# Patient Record
Sex: Female | Born: 1935 | Race: White | Hispanic: No | Marital: Single | Smoking: Never smoker
Health system: Southern US, Community
[De-identification: ages and names within clinical notes are randomized; demographics above are authoritative.]

## PROBLEM LIST (undated history)

## (undated) DIAGNOSIS — G5600 Carpal tunnel syndrome, unspecified upper limb: Secondary | ICD-10-CM

## (undated) DIAGNOSIS — C801 Malignant (primary) neoplasm, unspecified: Secondary | ICD-10-CM

## (undated) DIAGNOSIS — Z973 Presence of spectacles and contact lenses: Secondary | ICD-10-CM

## (undated) DIAGNOSIS — M810 Age-related osteoporosis without current pathological fracture: Secondary | ICD-10-CM

## (undated) DIAGNOSIS — M1990 Unspecified osteoarthritis, unspecified site: Secondary | ICD-10-CM

## (undated) DIAGNOSIS — E039 Hypothyroidism, unspecified: Secondary | ICD-10-CM

## (undated) DIAGNOSIS — Z8489 Family history of other specified conditions: Secondary | ICD-10-CM

## (undated) DIAGNOSIS — T8859XA Other complications of anesthesia, initial encounter: Secondary | ICD-10-CM

## (undated) DIAGNOSIS — T4145XA Adverse effect of unspecified anesthetic, initial encounter: Secondary | ICD-10-CM

## (undated) HISTORY — PX: EYE SURGERY: SHX253

## (undated) HISTORY — DX: Malignant (primary) neoplasm, unspecified: C80.1

## (undated) HISTORY — PX: OOPHORECTOMY: SHX86

## (undated) HISTORY — PX: OTHER SURGICAL HISTORY: SHX169

## (undated) HISTORY — DX: Carpal tunnel syndrome, unspecified upper limb: G56.00

## (undated) HISTORY — PX: ABDOMINAL HYSTERECTOMY: SHX81

## (undated) HISTORY — DX: Presence of spectacles and contact lenses: Z97.3

---

## 1965-03-18 HISTORY — PX: THYROID SURGERY: SHX805

## 1993-03-18 HISTORY — PX: COLON RESECTION: SHX5231

## 2004-02-14 ENCOUNTER — Ambulatory Visit: Payer: Self-pay | Admitting: Unknown Physician Specialty

## 2004-02-29 ENCOUNTER — Ambulatory Visit: Payer: Self-pay | Admitting: Unknown Physician Specialty

## 2004-04-12 ENCOUNTER — Observation Stay: Payer: Self-pay | Admitting: Internal Medicine

## 2004-05-02 ENCOUNTER — Ambulatory Visit: Payer: Self-pay | Admitting: Unknown Physician Specialty

## 2004-06-27 ENCOUNTER — Ambulatory Visit: Payer: Self-pay | Admitting: Unknown Physician Specialty

## 2004-08-06 ENCOUNTER — Ambulatory Visit: Payer: Self-pay | Admitting: Unknown Physician Specialty

## 2004-12-25 ENCOUNTER — Ambulatory Visit: Payer: Self-pay | Admitting: Unknown Physician Specialty

## 2005-08-13 ENCOUNTER — Ambulatory Visit: Payer: Self-pay | Admitting: Cardiology

## 2005-09-12 ENCOUNTER — Ambulatory Visit: Payer: Self-pay | Admitting: Unknown Physician Specialty

## 2006-07-01 ENCOUNTER — Ambulatory Visit: Payer: Self-pay | Admitting: Unknown Physician Specialty

## 2006-09-23 ENCOUNTER — Ambulatory Visit: Payer: Self-pay | Admitting: Unknown Physician Specialty

## 2007-05-01 ENCOUNTER — Ambulatory Visit: Payer: Self-pay

## 2007-09-30 ENCOUNTER — Ambulatory Visit: Payer: Self-pay | Admitting: Unknown Physician Specialty

## 2008-11-03 ENCOUNTER — Ambulatory Visit: Payer: Self-pay | Admitting: Unknown Physician Specialty

## 2009-11-30 ENCOUNTER — Ambulatory Visit: Payer: Self-pay | Admitting: Unknown Physician Specialty

## 2009-12-13 ENCOUNTER — Ambulatory Visit: Payer: Self-pay | Admitting: Unknown Physician Specialty

## 2010-04-23 ENCOUNTER — Ambulatory Visit (HOSPITAL_COMMUNITY)
Admission: RE | Admit: 2010-04-23 | Discharge: 2010-04-23 | Disposition: A | Discharge status: Home / Self Care | Payer: Medicare Other | Source: Ambulatory Visit | Attending: Surgery | Admitting: Surgery

## 2010-04-23 ENCOUNTER — Encounter (HOSPITAL_COMMUNITY): Payer: Medicare Other

## 2010-04-23 DIAGNOSIS — Z01818 Encounter for other preprocedural examination: Secondary | ICD-10-CM | POA: Insufficient documentation

## 2010-04-23 DIAGNOSIS — E049 Nontoxic goiter, unspecified: Secondary | ICD-10-CM | POA: Insufficient documentation

## 2010-04-23 LAB — CBC
MCH: 31.9 pg (ref 26.0–34.0)
MCHC: 33.1 g/dL (ref 30.0–36.0)
Platelets: 204 10*3/uL (ref 150–400)

## 2010-04-23 LAB — URINALYSIS, ROUTINE W REFLEX MICROSCOPIC
Bilirubin Urine: NEGATIVE
Ketones, ur: NEGATIVE mg/dL
Nitrite: NEGATIVE
Urobilinogen, UA: 0.2 mg/dL (ref 0.0–1.0)

## 2010-04-23 LAB — PROTIME-INR
INR: 1.04 (ref 0.00–1.49)
Prothrombin Time: 13.8 seconds (ref 11.6–15.2)

## 2010-04-23 LAB — DIFFERENTIAL
Basophils Relative: 1 % (ref 0–1)
Eosinophils Absolute: 0 10*3/uL (ref 0.0–0.7)
Monocytes Absolute: 0.6 10*3/uL (ref 0.1–1.0)
Monocytes Relative: 9 % (ref 3–12)
Neutrophils Relative %: 72 % (ref 43–77)

## 2010-04-23 LAB — BASIC METABOLIC PANEL
Calcium: 9.9 mg/dL (ref 8.4–10.5)
Creatinine, Ser: 0.83 mg/dL (ref 0.4–1.2)
GFR calc Af Amer: >60 mL/min (ref >60)

## 2010-04-23 LAB — URINE MICROSCOPIC-ADD ON

## 2010-05-01 ENCOUNTER — Ambulatory Visit (HOSPITAL_COMMUNITY)
Admission: RE | Admit: 2010-05-01 | Discharge: 2010-05-02 | Disposition: A | Discharge status: Home / Self Care | Payer: Medicare Other | Attending: Surgery | Admitting: Surgery

## 2010-05-01 ENCOUNTER — Other Ambulatory Visit: Payer: Self-pay | Admitting: Surgery

## 2010-05-01 DIAGNOSIS — I498 Other specified cardiac arrhythmias: Secondary | ICD-10-CM | POA: Insufficient documentation

## 2010-05-01 DIAGNOSIS — R9431 Abnormal electrocardiogram [ECG] [EKG]: Secondary | ICD-10-CM | POA: Insufficient documentation

## 2010-05-01 DIAGNOSIS — Z01812 Encounter for preprocedural laboratory examination: Secondary | ICD-10-CM | POA: Insufficient documentation

## 2010-05-01 DIAGNOSIS — Z79899 Other long term (current) drug therapy: Secondary | ICD-10-CM | POA: Insufficient documentation

## 2010-05-01 DIAGNOSIS — R110 Nausea: Secondary | ICD-10-CM | POA: Insufficient documentation

## 2010-05-01 DIAGNOSIS — E042 Nontoxic multinodular goiter: Secondary | ICD-10-CM | POA: Insufficient documentation

## 2010-05-01 LAB — CALCIUM: Calcium: 8.4 mg/dL (ref 8.4–10.5)

## 2010-05-02 LAB — CALCIUM: Calcium: 8.3 mg/dL — ABNORMAL LOW (ref 8.4–10.5)

## 2010-05-10 NOTE — Op Note (Signed)
Christine [name], Christine [name]                ACCOUNT NO.:  [id number]  MEDICAL RECORD NO.:  [id number]           PATIENT TYPE:  O  LOCATION:  XRAY                         FACILITY:  [facility]  PHYSICIAN:  Earnstine Regal, MD      DATE OF BIRTH:  [birth year]  DATE OF PROCEDURE:  05/01/2010 DATE OF DISCHARGE:  04/23/2010                              OPERATIVE REPORT   PREOPERATIVE DIAGNOSIS:  Bilateral thyroid nodules with cytologic atypia.  POSTOPERATIVE DIAGNOSIS:  Bilateral thyroid nodules with cytologic atypia.  PROCEDURE: 1. Completion thyroidectomy. 2. Autotransplantation right superior parathyroid gland to right     sternocleidomastoid muscle.  SURGEON:  Earnstine Regal, M.D.  ASSISTANT:  Orson Ape. Rise Patience, M.D.  ANESTHESIA:  General per Dr. Freddie Apley.  ESTIMATED BLOOD LOSS:  Minimal.  PREPARATION:  ChloraPrep.  COMPLICATIONS:  None.  INDICATIONS:  The patient is an 77 year old white female from [place], [place].  She is referred by Dr. Eddie Dibbles and Dr. Langston Masker the [facility] for evaluation of thyroid nodules with cytologic atypia.  The patient had her initial thyroid surgery in 1967 with partial resection of the left thyroid lobe for reportedly benign disease.  Patient took thyroid hormone replacement for 30 years.  She has now been off of thyroid hormone for several years.  A recent radiographic study demonstrated an incidental finding of a right thyroid nodule.  Fine-needle aspiration biopsy was obtained and showed cytologic atypia suspicious for papillary thyroid carcinoma.  Patient is now referred for consideration for completion thyroidectomy for management of bilateral thyroid nodules and possible malignancy in the right thyroid lobe.  DESCRIPTION OF PROCEDURE:  Procedure was done in OR #2 at the [facility].  Patient was brought to the operating room, placed in the supine position on the operating room table.   Following administration of general anesthesia, the patient was positioned and then prepped and draped in the usual strict aseptic fashion.  After ascertaining that an adequate level of anesthesia have been achieved, the previous Kocher incision was reopened with a #15 blade.  Dissection was carried through subcutaneous tissues and scar tissue.  Hemostasis was obtained with the electrocautery.  Subplatysmal flaps were developed cephalad and caudad from the thyroid notch to the sternal notch.  The Mahorner self-retaining retractor was placed for exposure.  Strap muscles were incised in the midline.  External jugular veins were suture ligated.  Dissection was carried down to the trachea.  With some difficulty, the musculature over the left thyroid bed was mobilized. There was dense scar tissue.  There is a residual left thyroid lobe, which is relatively small, but does contain palpable nodules.  Again, strap muscles were reflected laterally exposing the entire residual lobe.  Superior pole vessels were still present and were gently dissected out and divided between Ligaclips with the harmonic scalpel. Parathyroid tissue was identified and preserved.  Inferior venous tributaries were divided between Ligaclips with the harmonic scalpel. The residual lobe is rolled anteriorly.  Scar tissue was dissected away from the capsule.  Recurrent nerve was identified and preserved. Branches of the inferior thyroid artery are divided between  small Ligaclips with the harmonic scalpel.  Ligament of Gwenlyn Found was released and the gland was mobilized up and onto the anterior trachea.  There is a moderate-sized pyramidal lobe, which was also dissected off of the thyroid cartilage and resected on block with the isthmus and residual left thyroid lobe.  Left lobe was submitted to pathology for review.  Next, we turned our attention to the right thyroid lobe.  Again, strap muscles are reflected laterally with some  difficulty as they are densely adherent to the anterior surface of the lobe.  Right lobe was moderately enlarged.  There are multiple nodules, but there is a dominant mass in the inferior pole, which is worrisome for neoplasm.  Strap muscles are fully mobilize off of the capsule of the right lobe.  Superior pole vessels are dissected out and divided between medium Ligaclips with the harmonic scalpel.  The inferior venous tributaries were also dissected out and divided between medium Ligaclips with the harmonic scalpel. There is scarring laterally and posteriorly consistent with previous exploration.  This was gently mobilized on the capsule.  Branches of the inferior thyroid artery are divided between small and medium Ligaclips. Gland is rolled anteriorly.  Ligament of Gwenlyn Found is released with the electrocautery and the gland is mobilized onto the anterior trachea from which it is completely excised.  It is submitted separately labeled as right thyroid lobe to pathology.  Prior to submitting the gland, a close inspection reveals what appears to be parathyroid tissue near the superior pole.  This was dissected off of the capsule.  It was reimplanted into the right sternocleidomastoid muscle by opening a muscular pocket with a right angle clamp and inserting the fragmented parathyroid gland.  Muscle was closed with a 3-0 Vicryl figure-of-eight suture.  Neck is irrigated copiously bilaterally.  Good hemostasis was achieved bilaterally with Ligaclips.  Surgicel was placed in the operative field bilaterally.  Strap muscles were reapproximated in the midline with interrupted 3-0 Vicryl sutures.  Platysma was closed with interrupted 3- 0 Vicryl sutures.  Skin was closed with running 4-0 Monocryl subcuticular suture.  Wound was washed and dried and Benzoin and Steri- Strips are applied.  Sterile dressings were applied.  Patient was awakened from anesthesia and brought to the recovery room.  The  patient tolerated the procedure well.     Earnstine Regal, MD     TMG/MEDQ  D:  05/01/2010  T:  05/01/2010  Job:  [id number]  cc:   Adella Hare, M.D.  Kem Kays, M.D.  Earnstine Regal, MD [address] [address]  Electronically Signed by Armandina Gemma MD on 05/10/2010 10:32:59 AM

## 2010-05-10 NOTE — Discharge Summary (Signed)
  Christine [name], [name]                ACCOUNT NO.:  [id number]  MEDICAL RECORD NO.:  [id number]           PATIENT TYPE:  I  LOCATION:  [id number]                         FACILITY:  [facility]  PHYSICIAN:  Christine Regal, MD      DATE OF BIRTH:  11-11-1920  DATE OF ADMISSION:  05/01/2010 DATE OF DISCHARGE:  05/02/2010                              DISCHARGE SUMMARY   REASON FOR ADMISSION:  Thyroid nodules.  HISTORY OF PRESENT ILLNESS:  The patient is an 77 year old white female from [place], [place].  The patient has bilateral thyroid nodules with cytologic atypia and a right thyroid mass.  The patient had previous partial thyroid resection in 1967.  She now comes to the operating room for completion thyroidectomy.  HOSPITAL COURSE:  The patient was admitted on May 01, 2010.  She was taken directly to the operating room where she underwent completion thyroidectomy.  Postoperatively, the patient's serum calcium levels remained stable at 8.4 and 8.3.  She had mild pain.  She had mild nausea.  This was treated with intravenous Zofran and Phenergan.  The patient tolerated a diet.  She was prepared for discharge home on the first postoperative day.  DISCHARGE PLANNING:  The patient is discharged home on Wednesday May 02, 2010, in good condition.  DISCHARGE MEDICATIONS:  Include Synthroid 88 mcg daily, Vicodin as needed for pain, and Tums 2 tablets 3 times daily.  The patient will be seen back in my office at [facility] in 2 weeks.  A serum calcium level will be checked prior to that office visit.  FINAL DIAGNOSES:  Bilateral thyroid nodules with cytologic atypia, final pathologic results pending at the time of discharge.  CONDITION AT DISCHARGE:  Good.     Christine Regal, MD     TMG/MEDQ  D:  05/02/2010  T:  05/02/2010  Job:  [id number]  cc:   Christine [name], D.D.S. Fax: [phone]  Christine [name], M.D.  Electronically Signed by Christine Gemma MD on  05/10/2010 10:32:51 AM

## 2010-07-18 ENCOUNTER — Encounter (INDEPENDENT_AMBULATORY_CARE_PROVIDER_SITE_OTHER): Payer: Self-pay | Admitting: Surgery

## 2010-12-25 ENCOUNTER — Ambulatory Visit: Payer: Self-pay | Admitting: Unknown Physician Specialty

## 2011-12-26 ENCOUNTER — Ambulatory Visit: Payer: Self-pay | Admitting: Unknown Physician Specialty

## 2013-02-04 ENCOUNTER — Ambulatory Visit: Payer: Self-pay | Admitting: Internal Medicine

## 2013-12-28 DIAGNOSIS — N3946 Mixed incontinence: Secondary | ICD-10-CM | POA: Diagnosis present

## 2014-02-07 ENCOUNTER — Ambulatory Visit: Payer: Self-pay | Admitting: Internal Medicine

## 2014-09-01 DIAGNOSIS — E890 Postprocedural hypothyroidism: Secondary | ICD-10-CM | POA: Diagnosis not present

## 2014-09-08 DIAGNOSIS — R21 Rash and other nonspecific skin eruption: Secondary | ICD-10-CM | POA: Diagnosis not present

## 2014-09-08 DIAGNOSIS — E890 Postprocedural hypothyroidism: Secondary | ICD-10-CM | POA: Diagnosis not present

## 2014-09-08 DIAGNOSIS — G629 Polyneuropathy, unspecified: Secondary | ICD-10-CM | POA: Diagnosis not present

## 2014-11-03 DIAGNOSIS — H524 Presbyopia: Secondary | ICD-10-CM | POA: Diagnosis not present

## 2014-11-03 DIAGNOSIS — H5210 Myopia, unspecified eye: Secondary | ICD-10-CM | POA: Diagnosis not present

## 2014-12-09 DIAGNOSIS — E890 Postprocedural hypothyroidism: Secondary | ICD-10-CM | POA: Diagnosis not present

## 2014-12-16 DIAGNOSIS — E890 Postprocedural hypothyroidism: Secondary | ICD-10-CM | POA: Diagnosis not present

## 2014-12-16 DIAGNOSIS — N3946 Mixed incontinence: Secondary | ICD-10-CM | POA: Diagnosis not present

## 2014-12-16 DIAGNOSIS — G629 Polyneuropathy, unspecified: Secondary | ICD-10-CM | POA: Diagnosis not present

## 2015-01-09 ENCOUNTER — Inpatient Hospital Stay
Admission: EM | Admit: 2015-01-09 | Discharge: 2015-01-13 | DRG: 481 | Disposition: A | Discharge status: Skilled Nursing Facility | Payer: Commercial Managed Care - HMO | Attending: Internal Medicine | Admitting: Internal Medicine

## 2015-01-09 ENCOUNTER — Inpatient Hospital Stay: Payer: Commercial Managed Care - HMO | Admitting: Anesthesiology

## 2015-01-09 ENCOUNTER — Emergency Department: Payer: Commercial Managed Care - HMO

## 2015-01-09 ENCOUNTER — Inpatient Hospital Stay: Payer: Commercial Managed Care - HMO

## 2015-01-09 ENCOUNTER — Encounter
Admission: EM | Disposition: A | Discharge status: Skilled Nursing Facility | Payer: Self-pay | Source: Home / Self Care | Attending: Internal Medicine

## 2015-01-09 DIAGNOSIS — R42 Dizziness and giddiness: Secondary | ICD-10-CM | POA: Diagnosis not present

## 2015-01-09 DIAGNOSIS — Z85038 Personal history of other malignant neoplasm of large intestine: Secondary | ICD-10-CM

## 2015-01-09 DIAGNOSIS — S72001A Fracture of unspecified part of neck of right femur, initial encounter for closed fracture: Secondary | ICD-10-CM

## 2015-01-09 DIAGNOSIS — Z79899 Other long term (current) drug therapy: Secondary | ICD-10-CM

## 2015-01-09 DIAGNOSIS — Z419 Encounter for procedure for purposes other than remedying health state, unspecified: Secondary | ICD-10-CM

## 2015-01-09 DIAGNOSIS — D649 Anemia, unspecified: Secondary | ICD-10-CM | POA: Diagnosis not present

## 2015-01-09 DIAGNOSIS — D72829 Elevated white blood cell count, unspecified: Secondary | ICD-10-CM | POA: Diagnosis present

## 2015-01-09 DIAGNOSIS — H3530 Unspecified macular degeneration: Secondary | ICD-10-CM | POA: Diagnosis present

## 2015-01-09 DIAGNOSIS — Z9181 History of falling: Secondary | ICD-10-CM | POA: Diagnosis not present

## 2015-01-09 DIAGNOSIS — M1990 Unspecified osteoarthritis, unspecified site: Secondary | ICD-10-CM | POA: Diagnosis present

## 2015-01-09 DIAGNOSIS — Z7982 Long term (current) use of aspirin: Secondary | ICD-10-CM

## 2015-01-09 DIAGNOSIS — Y838 Other surgical procedures as the cause of abnormal reaction of the patient, or of later complication, without mention of misadventure at the time of the procedure: Secondary | ICD-10-CM | POA: Diagnosis not present

## 2015-01-09 DIAGNOSIS — L7622 Postprocedural hemorrhage and hematoma of skin and subcutaneous tissue following other procedure: Secondary | ICD-10-CM | POA: Diagnosis not present

## 2015-01-09 DIAGNOSIS — Z8673 Personal history of transient ischemic attack (TIA), and cerebral infarction without residual deficits: Secondary | ICD-10-CM | POA: Diagnosis not present

## 2015-01-09 DIAGNOSIS — E039 Hypothyroidism, unspecified: Secondary | ICD-10-CM | POA: Diagnosis present

## 2015-01-09 DIAGNOSIS — W1830XA Fall on same level, unspecified, initial encounter: Secondary | ICD-10-CM | POA: Diagnosis present

## 2015-01-09 DIAGNOSIS — R69 Illness, unspecified: Secondary | ICD-10-CM | POA: Diagnosis not present

## 2015-01-09 DIAGNOSIS — S72141A Displaced intertrochanteric fracture of right femur, initial encounter for closed fracture: Secondary | ICD-10-CM | POA: Diagnosis not present

## 2015-01-09 DIAGNOSIS — R531 Weakness: Secondary | ICD-10-CM | POA: Diagnosis not present

## 2015-01-09 DIAGNOSIS — Z01818 Encounter for other preprocedural examination: Secondary | ICD-10-CM | POA: Diagnosis not present

## 2015-01-09 DIAGNOSIS — M25562 Pain in left knee: Secondary | ICD-10-CM | POA: Diagnosis not present

## 2015-01-09 DIAGNOSIS — Y92000 Kitchen of unspecified non-institutional (private) residence as  the place of occurrence of the external cause: Secondary | ICD-10-CM | POA: Diagnosis not present

## 2015-01-09 DIAGNOSIS — F439 Reaction to severe stress, unspecified: Secondary | ICD-10-CM | POA: Diagnosis present

## 2015-01-09 DIAGNOSIS — M810 Age-related osteoporosis without current pathological fracture: Secondary | ICD-10-CM | POA: Diagnosis present

## 2015-01-09 DIAGNOSIS — M6281 Muscle weakness (generalized): Secondary | ICD-10-CM | POA: Diagnosis not present

## 2015-01-09 DIAGNOSIS — M80051D Age-related osteoporosis with current pathological fracture, right femur, subsequent encounter for fracture with routine healing: Secondary | ICD-10-CM | POA: Diagnosis not present

## 2015-01-09 DIAGNOSIS — M25561 Pain in right knee: Secondary | ICD-10-CM | POA: Diagnosis not present

## 2015-01-09 DIAGNOSIS — R2689 Other abnormalities of gait and mobility: Secondary | ICD-10-CM | POA: Diagnosis not present

## 2015-01-09 DIAGNOSIS — R339 Retention of urine, unspecified: Secondary | ICD-10-CM | POA: Diagnosis not present

## 2015-01-09 DIAGNOSIS — W19XXXA Unspecified fall, initial encounter: Secondary | ICD-10-CM

## 2015-01-09 DIAGNOSIS — H9190 Unspecified hearing loss, unspecified ear: Secondary | ICD-10-CM | POA: Diagnosis not present

## 2015-01-09 DIAGNOSIS — M25559 Pain in unspecified hip: Secondary | ICD-10-CM | POA: Diagnosis not present

## 2015-01-09 DIAGNOSIS — S72009A Fracture of unspecified part of neck of unspecified femur, initial encounter for closed fracture: Secondary | ICD-10-CM | POA: Diagnosis present

## 2015-01-09 DIAGNOSIS — R079 Chest pain, unspecified: Secondary | ICD-10-CM | POA: Diagnosis not present

## 2015-01-09 HISTORY — DX: Unspecified osteoarthritis, unspecified site: M19.90

## 2015-01-09 HISTORY — PX: INTRAMEDULLARY (IM) NAIL INTERTROCHANTERIC: SHX5875

## 2015-01-09 HISTORY — DX: Hypothyroidism, unspecified: E03.9

## 2015-01-09 HISTORY — DX: Age-related osteoporosis without current pathological fracture: M81.0

## 2015-01-09 LAB — PROTIME-INR
INR: 0.97
Prothrombin Time: 13.1 seconds (ref 11.4–15.0)

## 2015-01-09 LAB — CBC WITH DIFFERENTIAL/PLATELET
BASOS ABS: 0.1 10*3/uL (ref 0–0.1)
BASOS PCT: 1 %
EOS ABS: 0.1 10*3/uL (ref 0–0.7)
EOS PCT: 0 %
HCT: 38 % (ref 35.0–47.0)
Hemoglobin: 12.4 g/dL (ref 12.0–16.0)
Lymphocytes Relative: 14 %
Lymphs Abs: 1.7 10*3/uL (ref 1.0–3.6)
MCH: 31.6 pg (ref 26.0–34.0)
MCHC: 32.6 g/dL (ref 32.0–36.0)
MCV: 96.9 fL (ref 80.0–100.0)
MONO ABS: 0.5 10*3/uL (ref 0.2–0.9)
Monocytes Relative: 4 %
NEUTROS PCT: 81 %
Neutro Abs: 9.8 10*3/uL — ABNORMAL HIGH (ref 1.4–6.5)
PLATELETS: 219 10*3/uL (ref 150–440)
RBC: 3.92 MIL/uL (ref 3.80–5.20)
RDW: 13.1 % (ref 11.5–14.5)
WBC: 12.2 10*3/uL — ABNORMAL HIGH (ref 3.6–11.0)

## 2015-01-09 LAB — MRSA PCR SCREENING: MRSA BY PCR: NEGATIVE

## 2015-01-09 LAB — COMPREHENSIVE METABOLIC PANEL
ALK PHOS: 74 U/L (ref 38–126)
ALT: 15 U/L (ref 14–54)
AST: 31 U/L (ref 15–41)
Albumin: 3.9 g/dL (ref 3.5–5.0)
Anion gap: 10 (ref 5–15)
BUN: 17 mg/dL (ref 6–20)
CALCIUM: 8.9 mg/dL (ref 8.9–10.3)
CO2: 27 mmol/L (ref 22–32)
CREATININE: 0.94 mg/dL (ref 0.44–1.00)
Chloride: 102 mmol/L (ref 101–111)
GFR calc non Af Amer: 51 mL/min — ABNORMAL LOW (ref >60)
GFR, EST AFRICAN AMERICAN: 59 mL/min — AB (ref >60)
GLUCOSE: 166 mg/dL — AB (ref 65–99)
Potassium: 3.9 mmol/L (ref 3.5–5.1)
SODIUM: 139 mmol/L (ref 135–145)
Total Bilirubin: 0.8 mg/dL (ref 0.3–1.2)
Total Protein: 7.1 g/dL (ref 6.5–8.1)

## 2015-01-09 LAB — TROPONIN I

## 2015-01-09 LAB — ABO/RH: ABO/RH(D): A POS

## 2015-01-09 SURGERY — FIXATION, FRACTURE, INTERTROCHANTERIC, WITH INTRAMEDULLARY ROD
Anesthesia: Spinal | Site: Hip | Laterality: Right | Wound class: Clean

## 2015-01-09 MED ORDER — LACTATED RINGERS IV SOLN
INTRAVENOUS | Status: DC | PRN
  Administered 2015-01-09: 16:00:00 via INTRAVENOUS

## 2015-01-09 MED ORDER — BISACODYL 10 MG RE SUPP: 10.0000 mg | Freq: Every day | RECTAL | Status: DC | PRN

## 2015-01-09 MED ORDER — CEFAZOLIN SODIUM-DEXTROSE 2-3 GM-% IV SOLR
2.0000 g | INTRAVENOUS | Status: AC
  Administered 2015-01-09: 2 g via INTRAVENOUS
  Filled 2015-01-09 (×2): qty 50

## 2015-01-09 MED ORDER — OXYCODONE HCL 5 MG PO TABS
5.0000 mg | ORAL_TABLET | ORAL | Status: DC | PRN
  Administered 2015-01-09: 5 mg via ORAL
  Administered 2015-01-10 (×2): 10 mg via ORAL
  Administered 2015-01-10 – 2015-01-13 (×6): 5 mg via ORAL
  Filled 2015-01-09: qty 2
  Filled 2015-01-09 (×4): qty 1
  Filled 2015-01-09: qty 2
  Filled 2015-01-09 (×3): qty 1

## 2015-01-09 MED ORDER — VITAMIN D 1000 UNITS PO TABS
1000.0000 [IU] | ORAL_TABLET | Freq: Every evening | ORAL | Status: DC
Start: 2015-01-09 — End: 2015-01-13
  Administered 2015-01-10 – 2015-01-12 (×2): 1000 [IU] via ORAL
  Filled 2015-01-09 (×2): qty 1

## 2015-01-09 MED ORDER — NEOMYCIN-POLYMYXIN B GU 40-200000 IR SOLN
Status: AC
  Filled 2015-01-09: qty 2

## 2015-01-09 MED ORDER — SIMETHICONE 80 MG PO CHEW
80.0000 mg | CHEWABLE_TABLET | Freq: Four times a day (QID) | ORAL | Status: DC | PRN
  Administered 2015-01-10 – 2015-01-12 (×3): 80 mg via ORAL
  Filled 2015-01-09 (×3): qty 1

## 2015-01-09 MED ORDER — PANTOPRAZOLE SODIUM 40 MG PO TBEC
40.0000 mg | DELAYED_RELEASE_TABLET | Freq: Two times a day (BID) | ORAL | Status: DC
  Administered 2015-01-09 – 2015-01-13 (×8): 40 mg via ORAL
  Filled 2015-01-09 (×8): qty 1

## 2015-01-09 MED ORDER — CEFAZOLIN SODIUM-DEXTROSE 2-3 GM-% IV SOLR
2.0000 g | Freq: Four times a day (QID) | INTRAVENOUS | Status: AC
  Administered 2015-01-09 – 2015-01-10 (×3): 2 g via INTRAVENOUS
  Filled 2015-01-09 (×3): qty 50

## 2015-01-09 MED ORDER — PHENYLEPHRINE HCL 10 MG/ML IJ SOLN
INTRAMUSCULAR | Status: DC | PRN
  Administered 2015-01-09: 50 ug via INTRAVENOUS
  Administered 2015-01-09 (×2): 100 ug via INTRAVENOUS
  Administered 2015-01-09: 50 ug via INTRAVENOUS

## 2015-01-09 MED ORDER — DIPHENHYDRAMINE HCL 12.5 MG/5ML PO ELIX: 12.5000 mg | ORAL_SOLUTION | ORAL | Status: DC | PRN

## 2015-01-09 MED ORDER — ACETAMINOPHEN 325 MG PO TABS
650.0000 mg | ORAL_TABLET | Freq: Four times a day (QID) | ORAL | Status: DC | PRN
  Administered 2015-01-12: 650 mg via ORAL
  Filled 2015-01-09 (×2): qty 2

## 2015-01-09 MED ORDER — SODIUM CHLORIDE 0.9 % IJ SOLN
3.0000 mL | Freq: Two times a day (BID) | INTRAMUSCULAR | Status: DC
  Administered 2015-01-09 – 2015-01-13 (×5): 3 mL via INTRAVENOUS

## 2015-01-09 MED ORDER — FENTANYL CITRATE (PF) 100 MCG/2ML IJ SOLN
INTRAMUSCULAR | Status: AC
  Filled 2015-01-09: qty 2

## 2015-01-09 MED ORDER — MAGNESIUM HYDROXIDE 400 MG/5ML PO SUSP
30.0000 mL | Freq: Every day | ORAL | Status: DC | PRN
  Administered 2015-01-10 – 2015-01-11 (×2): 30 mL via ORAL
  Filled 2015-01-09 (×2): qty 30

## 2015-01-09 MED ORDER — FLEET ENEMA 7-19 GM/118ML RE ENEM: 1.0000 | ENEMA | Freq: Once | RECTAL | Status: DC | PRN

## 2015-01-09 MED ORDER — MIDAZOLAM HCL 5 MG/5ML IJ SOLN
INTRAMUSCULAR | Status: DC | PRN
  Administered 2015-01-09: 0.5 mg via INTRAVENOUS
  Administered 2015-01-09: 1 mg via INTRAVENOUS

## 2015-01-09 MED ORDER — BUPIVACAINE HCL (PF) 0.5 % IJ SOLN
INTRAMUSCULAR | Status: DC | PRN
  Administered 2015-01-09: 3 mL

## 2015-01-09 MED ORDER — FENTANYL CITRATE (PF) 100 MCG/2ML IJ SOLN
INTRAMUSCULAR | Status: DC | PRN
  Administered 2015-01-09: 50 ug via INTRAVENOUS

## 2015-01-09 MED ORDER — MORPHINE SULFATE (PF) 2 MG/ML IV SOLN: 2.0000 mg | INTRAVENOUS | Status: DC | PRN

## 2015-01-09 MED ORDER — METOCLOPRAMIDE HCL 5 MG/ML IJ SOLN
5.0000 mg | Freq: Three times a day (TID) | INTRAMUSCULAR | Status: DC | PRN
  Administered 2015-01-11: 10 mg via INTRAVENOUS
  Filled 2015-01-09: qty 2

## 2015-01-09 MED ORDER — HYDROMORPHONE HCL 1 MG/ML IJ SOLN
0.5000 mg | INTRAMUSCULAR | Status: DC | PRN
  Administered 2015-01-09: 0.5 mg via INTRAVENOUS
  Filled 2015-01-09: qty 1

## 2015-01-09 MED ORDER — PROPOFOL 500 MG/50ML IV EMUL
INTRAVENOUS | Status: DC | PRN
  Administered 2015-01-09: 15 ug/kg/min via INTRAVENOUS

## 2015-01-09 MED ORDER — ACETAMINOPHEN 650 MG RE SUPP
650.0000 mg | Freq: Four times a day (QID) | RECTAL | Status: DC | PRN
  Administered 2015-01-11: 650 mg via RECTAL

## 2015-01-09 MED ORDER — OXYCODONE HCL 5 MG PO TABS: 5.0000 mg | ORAL_TABLET | ORAL | Status: DC | PRN

## 2015-01-09 MED ORDER — LEVOTHYROXINE SODIUM 100 MCG PO TABS
100.0000 ug | ORAL_TABLET | ORAL | Status: DC
  Administered 2015-01-10 – 2015-01-12 (×3): 100 ug via ORAL
  Filled 2015-01-09 (×6): qty 1

## 2015-01-09 MED ORDER — ONDANSETRON HCL 4 MG PO TABS: 4.0000 mg | ORAL_TABLET | Freq: Four times a day (QID) | ORAL | Status: DC | PRN

## 2015-01-09 MED ORDER — KCL IN DEXTROSE-NACL 20-5-0.9 MEQ/L-%-% IV SOLN
INTRAVENOUS | Status: DC
  Administered 2015-01-09 – 2015-01-11 (×4): via INTRAVENOUS
  Filled 2015-01-09 (×8): qty 1000

## 2015-01-09 MED ORDER — ENOXAPARIN SODIUM 40 MG/0.4ML ~~LOC~~ SOLN
40.0000 mg | SUBCUTANEOUS | Status: DC
Start: 2015-01-10 — End: 2015-01-13
  Administered 2015-01-10 – 2015-01-13 (×4): 40 mg via SUBCUTANEOUS
  Filled 2015-01-09 (×4): qty 0.4

## 2015-01-09 MED ORDER — METOCLOPRAMIDE HCL 5 MG PO TABS: 5.0000 mg | ORAL_TABLET | Freq: Three times a day (TID) | ORAL | Status: DC | PRN

## 2015-01-09 MED ORDER — ONDANSETRON HCL 4 MG/2ML IJ SOLN
INTRAMUSCULAR | Status: AC
  Administered 2015-01-09: 4 mg via INTRAVENOUS
  Filled 2015-01-09: qty 2

## 2015-01-09 MED ORDER — CALCIUM CARBONATE-VITAMIN D 500-200 MG-UNIT PO TABS
1.0000 | ORAL_TABLET | Freq: Two times a day (BID) | ORAL | Status: DC
  Administered 2015-01-09 – 2015-01-13 (×8): 1 via ORAL
  Filled 2015-01-09 (×8): qty 1

## 2015-01-09 MED ORDER — ALPRAZOLAM 0.25 MG PO TABS
0.2500 mg | ORAL_TABLET | Freq: Two times a day (BID) | ORAL | Status: DC | PRN
  Filled 2015-01-09: qty 1

## 2015-01-09 MED ORDER — LIDOCAINE HCL (CARDIAC) 20 MG/ML IV SOLN
INTRAVENOUS | Status: DC | PRN
  Administered 2015-01-09: 40 mg via INTRAVENOUS

## 2015-01-09 MED ORDER — ONDANSETRON HCL 4 MG/2ML IJ SOLN
4.0000 mg | Freq: Four times a day (QID) | INTRAMUSCULAR | Status: DC | PRN
  Administered 2015-01-10 – 2015-01-11 (×2): 4 mg via INTRAVENOUS
  Filled 2015-01-09: qty 2

## 2015-01-09 MED ORDER — ONDANSETRON HCL 4 MG/2ML IJ SOLN
4.0000 mg | Freq: Four times a day (QID) | INTRAMUSCULAR | Status: DC | PRN
  Administered 2015-01-09: 4 mg via INTRAVENOUS

## 2015-01-09 MED ORDER — BISACODYL 10 MG RE SUPP
10.0000 mg | Freq: Every day | RECTAL | Status: DC | PRN
  Filled 2015-01-09: qty 1

## 2015-01-09 MED ORDER — PROPOFOL 10 MG/ML IV BOLUS
INTRAVENOUS | Status: DC | PRN
  Administered 2015-01-09 (×2): 20 mg via INTRAVENOUS

## 2015-01-09 MED ORDER — FENTANYL CITRATE (PF) 100 MCG/2ML IJ SOLN
25.0000 ug | INTRAMUSCULAR | Status: DC | PRN
  Administered 2015-01-09: 25 ug via INTRAVENOUS

## 2015-01-09 MED ORDER — HYDROMORPHONE HCL 1 MG/ML IJ SOLN
0.5000 mg | Freq: Once | INTRAMUSCULAR | Status: AC
  Administered 2015-01-09: 0.5 mg via INTRAVENOUS
  Filled 2015-01-09: qty 1

## 2015-01-09 MED ORDER — DOCUSATE SODIUM 100 MG PO CAPS
100.0000 mg | ORAL_CAPSULE | Freq: Two times a day (BID) | ORAL | Status: DC
  Administered 2015-01-09 – 2015-01-13 (×8): 100 mg via ORAL
  Filled 2015-01-09 (×8): qty 1

## 2015-01-09 MED ORDER — SODIUM CHLORIDE 0.9 % IV SOLN
INTRAVENOUS | Status: DC
  Administered 2015-01-09: 13:00:00 via INTRAVENOUS

## 2015-01-09 MED ORDER — ACETAMINOPHEN 325 MG PO TABS: 650.0000 mg | ORAL_TABLET | Freq: Four times a day (QID) | ORAL | Status: DC | PRN

## 2015-01-09 MED ORDER — EPHEDRINE SULFATE 50 MG/ML IJ SOLN
INTRAMUSCULAR | Status: DC | PRN
  Administered 2015-01-09: 10 mg via INTRAVENOUS

## 2015-01-09 MED ORDER — POLYETHYLENE GLYCOL 3350 17 G PO PACK: 17.0000 g | PACK | Freq: Every day | ORAL | Status: DC | PRN

## 2015-01-09 MED ORDER — ONDANSETRON HCL 4 MG/2ML IJ SOLN
4.0000 mg | Freq: Once | INTRAMUSCULAR | Status: DC | PRN
  Filled 2015-01-09 (×2): qty 2

## 2015-01-09 MED ORDER — ACETAMINOPHEN 650 MG RE SUPP: 650.0000 mg | Freq: Four times a day (QID) | RECTAL | Status: DC | PRN

## 2015-01-09 SURGICAL SUPPLY — 39 items
BIT DRILL 4.3MMS DISTAL GRDTED (BIT) ×1 IMPLANT
BNDG COHESIVE 4X5 TAN STRL (GAUZE/BANDAGES/DRESSINGS) ×3 IMPLANT
BNDG COHESIVE 6X5 TAN STRL LF (GAUZE/BANDAGES/DRESSINGS) ×3 IMPLANT
CANISTER SUCT 1200ML W/VALVE (MISCELLANEOUS) ×3 IMPLANT
CHLORAPREP W/TINT 26ML (MISCELLANEOUS) ×6 IMPLANT
DRAPE C-ARMOR (DRAPES) ×3 IMPLANT
DRAPE SHEET LG 3/4 BI-LAMINATE (DRAPES) ×3 IMPLANT
DRAPE SURG 17X11 SM STRL (DRAPES) ×3 IMPLANT
DRAPE U-SHAPE 47X51 STRL (DRAPES) ×3 IMPLANT
DRILL 4.3MMS DISTAL GRADUATED (BIT) ×3
ELECT CAUTERY BLADE 6.4 (BLADE) ×3 IMPLANT
GAUZE SPONGE 4X4 12PLY STRL (GAUZE/BANDAGES/DRESSINGS) ×6 IMPLANT
GLOVE BIO SURGEON STRL SZ8 (GLOVE) ×12 IMPLANT
GLOVE INDICATOR 8.0 STRL GRN (GLOVE) ×3 IMPLANT
GOWN STRL REUS W/ TWL LRG LVL3 (GOWN DISPOSABLE) ×2 IMPLANT
GOWN STRL REUS W/ TWL XL LVL3 (GOWN DISPOSABLE) ×1 IMPLANT
GOWN STRL REUS W/TWL LRG LVL3 (GOWN DISPOSABLE) ×4
GOWN STRL REUS W/TWL XL LVL3 (GOWN DISPOSABLE) ×2
GUIDEPIN 3.2X17.5 THRD DISP (PIN) ×3 IMPLANT
GUIDEPIN VERSANAIL DSP 3.2X444 ×3 IMPLANT
GUIDEWIRE BALL NOSE 100CM (WIRE) ×3 IMPLANT
HFN RH 130 DEG 11MM X 360MM (Orthopedic Implant) ×3 IMPLANT
HIP FRAC NAIL LAG SCR 10.5X100 (Orthopedic Implant) ×2 IMPLANT
MAT BLUE FLOOR 46X72 FLO (MISCELLANEOUS) ×3 IMPLANT
NEEDLE FILTER BLUNT 18X 1/2SAF (NEEDLE) ×2
NEEDLE FILTER BLUNT 18X1 1/2 (NEEDLE) ×1 IMPLANT
NS IRRIG 500ML POUR BTL (IV SOLUTION) ×3 IMPLANT
PACK HIP COMPR (MISCELLANEOUS) ×3 IMPLANT
PAD GROUND ADULT SPLIT (MISCELLANEOUS) ×3 IMPLANT
SCREW BONE CORTICAL 5.0X40 (Screw) ×3 IMPLANT
SCREW CANN THRD AFF 10.5X100 (Orthopedic Implant) ×1 IMPLANT
SCREWDRIVER HEX TIP 3.5MM (MISCELLANEOUS) ×3 IMPLANT
STAPLER SKIN PROX 35W (STAPLE) ×3 IMPLANT
STRAP SAFETY BODY (MISCELLANEOUS) ×3 IMPLANT
SUT VIC AB 0 CT2 27 (SUTURE) ×3 IMPLANT
SUT VIC AB 1 CT1 36 (SUTURE) IMPLANT
SUT VIC AB 2-0 CT1 (SUTURE) ×6 IMPLANT
SYRINGE 10CC LL (SYRINGE) ×3 IMPLANT
TAPE MICROFOAM 4IN (TAPE) ×3 IMPLANT

## 2015-01-09 NOTE — Anesthesia Procedure Notes (Signed)
Spinal Patient location during procedure: OR Staffing Resident/CRNA: Vernecia Umble Performed by: resident/CRNA  Preanesthetic Checklist Completed: patient identified, site marked, surgical consent, pre-op evaluation, timeout performed, IV checked, risks and benefits discussed and monitors and equipment checked Spinal Block Patient position: left lateral decubitus Prep: Betadine Patient monitoring: heart rate, continuous pulse ox and blood pressure Approach: midline Location: L4-5 Injection technique: single-shot Needle Needle type: Whitacre and Introducer  Needle gauge: 25 G Needle length: 9 cm Assessment Sensory level: T6 Additional Notes  Christine Bucy, CRNA spinal attempt x2 at L4-L5 and L3-L4, respectively, with 25 g Whitacre wiithout success; Christine Blossom,  CRNA attempt x1 at L4-5 with 25 G Whitacre with success. Negative paresthesia. Negative blood return. Positive free-flowing CSF. Expiration date of kit checked and confirmed. Patient tolerated procedure well, without complications.

## 2015-01-09 NOTE — Clinical Social Work Placement (Signed)
   CLINICAL SOCIAL WORK PLACEMENT  NOTE  Date:  01/09/2015  Patient Details  Name: Christine [name] MRN: [id number] Date of Birth: January 08, 1921  Clinical Social Work is seeking post-discharge placement for this patient at the [place] level of care (*CSW will initial, date and re-position this form in  chart as items are completed):  Yes   Patient/family provided with [facility] Work Department's list of facilities offering this level of care within the geographic area requested by the patient (or if unable, by the patient's family).  Yes   Patient/family informed of their freedom to choose among providers that offer the needed level of care, that participate in Medicare, Medicaid or managed care program needed by the patient, have an available bed and are willing to accept the patient.  Yes   Patient/family informed of [facility]'s ownership interest in [facility] and [facility], as well as of the fact that they are under no obligation to receive care at these facilities.  PASRR submitted to EDS on 01/09/15     PASRR number received on 01/09/15     Existing PASRR number confirmed on       FL2 transmitted to all facilities in geographic area requested by pt/family on 01/09/15     FL2 transmitted to all facilities within larger geographic area on       Patient informed that his/her managed care company has contracts with or will negotiate with certain facilities, including the following:            Patient/family informed of bed offers received.  Patient chooses bed at       Physician recommends and patient chooses bed at      Patient to be transferred to   on  .  Patient to be transferred to facility by       Patient family notified on   of transfer.  Name of family member notified:        PHYSICIAN Please sign FL2     Additional Comment:    _______________________________________________ Loralyn Freshwater, LCSW 01/09/2015,  1:49 PM

## 2015-01-09 NOTE — ED Provider Notes (Signed)
[facility] Emergency Department Provider Note     Time seen: ----------------------------------------- 9:07 AM on 01/09/2015 -----------------------------------------    I have reviewed the triage vital signs and the nursing notes.   HISTORY  Chief Complaint No chief complaint on file.    HPI Christine [name] is a 81 y.o. female who presents ER after a fall. Patient reports being in the kitchen walking to the sink to wash dishes and fell. She crawled to the living room and called family who called EMS. She is complaining of severe right hip pain. She also has abrasions and pain over both knees. Patient with visible external rotation and shortening of the right leg.   Past Medical History  Diagnosis Date  . Hearing loss   . Wears glasses   . Cancer     colon  . Carpal tunnel syndrome     There are no active problems to display for this patient.   Past Surgical History  Procedure Laterality Date  . Thyroid surgery  1967    Allergies Review of patient's allergies indicates no known allergies.  Social History Social History  Substance Use Topics  . Smoking status: Never Smoker   . Smokeless tobacco: Not on file  . Alcohol Use: No    Review of Systems Constitutional: Negative for fever. Eyes: Negative for visual changes. ENT: Negative for sore throat. Cardiovascular: Negative for chest pain. Respiratory: Negative for shortness of breath. Gastrointestinal: Negative for abdominal pain, vomiting and diarrhea. Genitourinary: Negative for dysuria. Musculoskeletal: Positive for right hip and bilateral knee pain Skin: Positive for abrasions over both knees Neurological: Negative for headaches, focal weakness or numbness.  10-point ROS otherwise negative.  ____________________________________________   PHYSICAL EXAM:  VITAL SIGNS: ED Triage Vitals  Enc Vitals Group     BP --      Pulse --      Resp --      Temp --      Temp src  --      SpO2 --      Weight --      Height --      Head Cir --      Peak Flow --      Pain Score --      Pain Loc --      Pain Edu? --      Excl. in [place]? --     Constitutional: Alert and oriented. She is in moderate distress from pain Eyes: Conjunctivae are normal. PERRL. Normal extraocular movements. ENT   Head: Normocephalic and atraumatic.   Nose: No congestion/rhinnorhea.   Mouth/Throat: Mucous membranes are moist.   Neck: No stridor. Cardiovascular: Normal rate, regular rhythm. Normal and symmetric distal pulses are present in all extremities. No murmurs, rubs, or gallops. Respiratory: Normal respiratory effort without tachypnea nor retractions. Breath sounds are clear and equal bilaterally. No wheezes/rales/rhonchi. Gastrointestinal: Soft and nontender. No distention. No abdominal bruits.  Musculoskeletal: Regular extremity is shortened and external rotated. Severely tender to touch. She also has abrasions over both knees. Neurologic:  Normal speech and language. No gross focal neurologic deficits are appreciated. Speech is normal. No gait instability. Skin:  Skin is warm, dry with abrasions over both knees. ____________________________________________  EKG: Interpreted by me. Normal sinus rhythm with a rate of 70 bpm, normal PR interval, normal QRS with, normal QT interval. There are nonspecific ST and T-wave changes.  ____________________________________________  ED COURSE:  Pertinent labs & imaging results that were available  during my care of the patient were reviewed by me and considered in my medical decision making (see chart for details). Patient need imaging of the knees hips and chest. Likely hip fracture. ____________________________________________    LABS (pertinent positives/negatives)  Labs Reviewed  COMPREHENSIVE METABOLIC PANEL - Abnormal; Notable for the following:    Glucose, Bld 166 (*)    GFR calc non Af Amer 51 (*)    GFR calc Af Amer  59 (*)    All other components within normal limits  CBC WITH DIFFERENTIAL/PLATELET - Abnormal; Notable for the following:    WBC 12.2 (*)    Neutro Abs 9.8 (*)    All other components within normal limits  TROPONIN I  PROTIME-INR  URINALYSIS COMPLETEWITH MICROSCOPIC ([facility] ONLY)  TYPE AND SCREEN    RADIOLOGY Images were viewed by me  Right hip, chest x-ray Comminuted intertrochanteric right hip fracture ____________________________________________  FINAL ASSESSMENT AND PLAN  Fall, right hip fracture  Plan: Patient with labs and imaging as dictated above. Patient with acute right intertrochanteric hip fracture. Will discuss with the hospitalist and orthopedic surgery. Patient will need ORIF.   Earleen Newport, MD   Earleen Newport, MD 01/09/15 [phone]

## 2015-01-09 NOTE — Op Note (Signed)
01/09/2015  6:00 PM  Patient:   Christine [name]  Pre-Op Diagnosis:   Three-part intertrochanteric fracture right hip.  Post-Op Diagnosis:   Same.  Procedure:   Reduction and internal fixation of three-part intertrochanteric right hip fracture with Biomet Affyxis TFN nail.  Surgeon:   Pascal Lux, MD  Assistant:   None  Anesthesia:   Spinal  Findings:   As above  Complications:   None  EBL:   50 cc  Fluids:   700 cc crystalloid  UOP:   125 cc  TT:   None  Drains:   None  Closure:   Staples  Implants:   Biomet Affyxis 11x360 mm TFN with 100 mm lag screw and 40 mm distal interlocking screw  Brief Clinical Note:   The patient is a 81 year old female who sustained the above-noted injury earlier this morning when she lost her balance while turning toward the refrigerator in her kitchen and fell onto her right hip. She was brought to the emergency room where x-rays demonstrated the above-noted injury. The patient has been cleared medically and presents at this time for reduction and internal fixation of the right hip fracture.  Procedure:   The patient was brought into the operating room. After adequate spinal anesthesia was obtained, the patient was lain in the supine position on the fracture table. The uninjured leg was placed in a flexed and abducted position while the injured lower extremity was placed in longitudinal traction. The fracture was reduced using longitudinal traction and internal rotation. The adequacy of reduction was verified fluoroscopically in AP and lateral projections and found to be near anatomic. The lateral aspects of the right hip and thigh were prepped with ChloraPrep solution before being draped sterilely. Preoperative antibiotics were administered. The greater trochanter was identified fluoroscopically and an approximately 3 cm incision made about 2-3 fingerbreadths above the tip of the greater trochanter. The incision was carried down through the  subcutaneous tissues to expose the gluteal fascia. This was split the length of the incision, providing access to the tip of the trochanter. Under fluoroscopic guidance, a guidewire was drilled through the tip of the trochanter into the proximal metaphysis to the level of the lesser trochanter. After verifying its position fluoroscopically in AP and lateral projections, it was overreamed with the initial reamer to the depth of the lesser trochanter. A guidewire was passed down through the femoral canal to the supracondylar region. The adequacy of guidewire position was verified fluoroscopically in AP and lateral projections before the length of the guidewire within the canal was measured and found to be 360 mm. It was overreamed sequentially using the flexible reamers, beginning with a 10 mm reamer and progressing to a 12.5 mm reamer. This provided good cortical chatter. The 11 x 360 mm Biomet Affyxis TFN rod was selected and advanced to the appropriate depth, as verified fluoroscopically. The guide system for the lag screw was positioned and advanced through an approximately 2 cm stab incision over the lateral aspect of the proximal femur. The guidewire was drilled up through the trochanteric femoral nail and into the femoral neck to rest within 5 mm of subchondral bone. After verifying its position in the femoral neck and head in both AP and lateral projections, the guidewire was measured and found to be optimally replicated by a 123XX123 mm lag screw. The guidewire was overreamed to the appropriate depth before the lag screw was inserted and advanced to the appropriate depth as verified fluoroscopically in AP  and lateral projections. The locking screw was advanced, then backed off a quarter turn to set the lag screw. Again the adequacy of hardware position and fracture reduction was verified fluoroscopically in AP and lateral projections and found to be excellent.  Attention was directed distally. Using the "perfect  circle" technique, the leg and fluoroscopy machine were positioned appropriately. An approximate 1.5 cm stab incision was made over the skin at the appropriate point before the drill bit was advanced through the cortex and across the static hole of the nail. The appropriate length of the screw was determined before the 40 mm distal interlocking screw was positioned, then advanced and tightened securely. Again the adequacy of screw position was verified fluoroscopically in AP and lateral projections and found to be excellent.  The wounds were irrigated thoroughly with sterile saline solution before the deeper subcutaneous tissues were closed using 2-0 Vicryl interrupted sutures. The skin was closed using staples. Sterile bulky dressings were applied to all wounds before the patient was transferred back to his/her hospital bed. The patient was then transferred to the recovery room in satisfactory condition after tolerating the procedure well.

## 2015-01-09 NOTE — H&P (Signed)
[facility] NAME: Christine [name]    MR#:  [id number]  DATE OF BIRTH:  04/23/1920  DATE OF ADMISSION:  01/09/2015  PRIMARY CARE PHYSICIAN: Glendon Axe, MD   REQUESTING/REFERRING PHYSICIAN: Dr. Lenise Arena  CHIEF COMPLAINT:   Chief Complaint  Patient presents with  . Fall    HISTORY OF PRESENT ILLNESS:  Christine [name]  is a 81 y.o. female with a known history of colon cancer in remission now, hypothyroidism, osteoporosis, hearing loss presents to the hospital after she had a mechanical fall and suffers from right hip fracture. Patient is very functional for her age, lives at home independently. Slightly off balance when she walks, supposed to be using the cane according to the daughter. Patient usually just uses a tobacco stick for ambulation and has had a couple of falls in the last month. Today she was walking in the kitchen and felt that her legs gave out and had a mechanical fall. No passing out, no chest pain and no loss of consciousness after the fall. Patient crawled herself into the living room and called her family and also 911. Labs look great. EKG with no acute ST-T changes. She had significant right hip pain since the fall and x-ray reveals intertrochanteric comminuted fracture of right hip. PAST MEDICAL HISTORY:   Past Medical History  Diagnosis Date  . Hearing loss   . Wears glasses   . Cancer ([place])     colon  . Carpal tunnel syndrome   . Hypothyroidism   . Osteoporosis   . DJD (degenerative joint disease)     PAST SURGICAL HISTORY:   Past Surgical History  Procedure Laterality Date  . Thyroid surgery  1967    for multinodular goitre  . Lung surgery      as a child for empyema  . Colon resection      for colon cancer  . Abdominal hysterectomy    . Oophorectomy      SOCIAL HISTORY:   Social History  Substance Use Topics  . Smoking status: Never Smoker   . Smokeless tobacco: Not on file   . Alcohol Use: No    FAMILY HISTORY:   Family History  Problem Relation Age of Onset  . Heart disease Father   . Diabetes Sister   . Cancer Sister   . Diabetes Brother   . Cancer Brother     DRUG ALLERGIES:  No Known Allergies  REVIEW OF SYSTEMS:   Review of Systems  Constitutional: Negative for fever, chills, weight loss and malaise/fatigue.  HENT: Positive for hearing loss. Negative for ear discharge, ear pain, nosebleeds and tinnitus.   Eyes: Negative for blurred vision, double vision and photophobia.       Uses reading glasses  Respiratory: Negative for cough, hemoptysis, shortness of breath and wheezing.   Cardiovascular: Negative for chest pain, palpitations, orthopnea and leg swelling.  Gastrointestinal: Negative for heartburn, nausea, vomiting, abdominal pain, diarrhea, constipation and melena.       Burping  Genitourinary: Negative for dysuria, urgency, frequency and hematuria.  Musculoskeletal: Positive for joint pain. Negative for myalgias, back pain and neck pain.  Skin: Negative for rash.  Neurological: Negative for dizziness, tingling, tremors, sensory change, speech change, focal weakness and headaches.  Endo/Heme/Allergies: Does not bruise/bleed easily.  Psychiatric/Behavioral: Negative for depression. The patient is nervous/anxious.     MEDICATIONS AT HOME:   Prior to Admission medications   Medication Sig Start Date End  Date Taking? Authorizing Provider  aspirin EC 81 MG tablet Take 81 mg by mouth daily as needed for mild pain or moderate pain.   Yes Historical Provider, MD  Calcium Carb-Cholecalciferol (CALCIUM 600 + D) 600-200 MG-UNIT TABS Take 1 tablet by mouth 2 (two) times daily. With lunch and dinner   Yes Historical Provider, MD  Cholecalciferol (VITAMIN D) 1000 UNITS capsule Take 1,000 Units by mouth every evening.    Yes Historical Provider, MD  levothyroxine (SYNTHROID, LEVOTHROID) 100 MCG tablet Take 100 mcg by mouth daily.   Yes Historical  Provider, MD      VITAL SIGNS:  Blood pressure 146/56, pulse 76, temperature 98.1 F (36.7 C), temperature source Oral, resp. rate 21, height 5\' 5"  (1.651 m), weight 54.432 kg (120 lb), SpO2 100 %.  PHYSICAL EXAMINATION:   Physical Exam  GENERAL:  81 y.o.-year-old patient lying in the bed with no acute distress.  EYES: Pupils equal, round, reactive to light and accommodation. No scleral icterus. Extraocular muscles intact. Uses glasses HEENT: Head atraumatic, normocephalic. Oropharynx and nasopharynx clear. Hard of hearing NECK:  Supple, no jugular venous distention. No thyroid enlargement, no tenderness.  LUNGS: Normal breath sounds bilaterally, no wheezing, rales,rhonchi or crepitation. No use of accessory muscles of respiration. Decreased bibasilar breath sounds CARDIOVASCULAR: S1, S2 normal. No rubs, or gallops. 3/6 systolic murmur heard ABDOMEN: Soft, nontender, nondistended. Bowel sounds present. No organomegaly or mass.  EXTREMITIES: bruising on both knees, right knee with a large skin tear, right leg is abducted and externally rotated. No pedal edema, cyanosis, or clubbing.  NEUROLOGIC: Cranial nerves II through XII are intact. Muscle strength 5/5 in all extremities. Limited movement of right leg due to pain.  Sensation intact. Gait not checked.  PSYCHIATRIC: The patient is alert and oriented x 3.  SKIN: No obvious rash, lesion, or ulcer.   LABORATORY PANEL:   CBC  Recent Labs Lab 01/09/15 0913  WBC 12.2*  HGB 12.4  HCT 38.0  PLT 219   ------------------------------------------------------------------------------------------------------------------  Chemistries   Recent Labs Lab 01/09/15 0913  NA 139  K 3.9  CL 102  CO2 27  GLUCOSE 166*  BUN 17  CREATININE 0.94  CALCIUM 8.9  AST 31  ALT 15  ALKPHOS 74  BILITOT 0.8   ------------------------------------------------------------------------------------------------------------------  Cardiac  Enzymes  Recent Labs Lab 01/09/15 0913  TROPONINI <0.03   ------------------------------------------------------------------------------------------------------------------  RADIOLOGY:  Dg Chest 1 View  01/09/2015  CLINICAL DATA:  Pain following fall EXAM: CHEST 1 VIEW COMPARISON:  April 23, 2010 FINDINGS: There is no edema or consolidation. Heart is upper normal in size with pulmonary vascularity within normal limits. No adenopathy. No pneumothorax. Bones are diffusely osteoporotic. There are surgical clips in the upper thoracic region, midline. IMPRESSION: No edema or consolidation. Bones diffusely osteoporotic. No pneumothorax apparent. Electronically Signed   By: Lowella Grip III M.D.   On: 01/09/2015 10:33   Dg Knee 2 Views Left  01/09/2015  CLINICAL DATA:  Pain following fall EXAM: LEFT KNEE - 1-2 VIEW COMPARISON:  None. FINDINGS: Frontal and lateral views were obtained. No fracture or dislocation. No appreciable joint effusion. There is mild narrowing medially. Bones are diffusely osteoporotic. IMPRESSION: No fracture or joint effusion. Narrowing medially. Bones osteoporotic. Electronically Signed   By: Lowella Grip III M.D.   On: 01/09/2015 10:31   Dg Knee 2 Views Right  01/09/2015  CLINICAL DATA:  Status post fall this morning. Right knee pain. Initial encounter. EXAM: RIGHT KNEE - 1-2 VIEW  COMPARISON:  None. FINDINGS: The AP view is limited as a portion of the fibular head is off the margin of film. The tech reports positioning the patient was difficult due to her condition. No acute bony or joint abnormality is seen. No notable degenerative change. IMPRESSION: No acute abnormality. Electronically Signed   By: Inge Rise M.D.   On: 01/09/2015 10:34   Dg Hip Unilat With Pelvis 2-3 Views Right  01/09/2015  CLINICAL DATA:  Pain following fall EXAM: DG HIP (WITH OR WITHOUT PELVIS) 2-3V RIGHT COMPARISON:  None. FINDINGS: Frontal pelvis as well as frontal and lateral  right hip images were obtained. There is a comminuted intertrochanteric femur fracture on the right with varus angulation at the fracture site. No other fractures are identified. No dislocation. There is mild symmetric narrowing of both hip joints. There is degenerative change in the lower lumbar spine. Bones are diffusely osteoporotic. IMPRESSION: Comminuted intertrochanteric femur fracture on the right with varus angulation at the fracture site. No dislocation. Narrowing both hip joints. Bones diffusely osteoporotic. Electronically Signed   By: Lowella Grip III M.D.   On: 01/09/2015 10:31    EKG:   Orders placed or performed during the hospital encounter of 01/09/15  . ED EKG  . ED EKG  . EKG 12-Lead  . EKG 12-Lead    IMPRESSION AND PLAN:   Christine [name]  is a 81 y.o. female with a known history of colon cancer in remission now, hypothyroidism, osteoporosis, hearing loss presents to the hospital after she had a mechanical fall and suffers from right hip fracture.  #1 Right hip fracture-status post mechanical fall. -Patient has a low cardiac risk for a low-risk surgery -Can proceed with surgery. -Orthopedics consulted. Pain medications when necessary. -Postoperative physical therapy and DVT prophylaxis. -Likely will need rehabilitation  #2 Osteoporosis-cont calcium, Vit D Supplements  #3 Leukocytosis- stress reaction from hip fracture - monitor, UA pending  #4 Hypothyroidism- on synthroid, continue  #5 DVT Prophylaxis- TEDs and SCDs Lovenox likely after surgery  All the records are reviewed and case discussed with ED provider. Management plans discussed with the patient, family and they are in agreement.  CODE STATUS: Full Code  TOTAL TIME TAKING CARE OF THIS PATIENT: 50 minutes.    Gladstone Lighter M.D on 01/09/2015 at 11:23 AM  Between 7am to 6pm - Pager - [phone]  After 6pm go to [email or web address] - password [email or web address] [facility]  Office   [phone]  CC: Primary care physician; Glendon Axe, MD

## 2015-01-09 NOTE — ED Notes (Signed)
Patient in via EMS for fall.  Patient reports being in kitchen and walking to sink to wash dishes and fell.  Patient crawled to living room and called family you called EMS. Patient complains of pain to right leg.  Patient with visible external rotation with shorting of leg.

## 2015-01-09 NOTE — Clinical Social Work Note (Signed)
Clinical Social Work Assessment  Patient Details  Name: Christine [name] MRN: [id number] Date of Birth: 04-Nov-1920  Date of referral:  01/09/15               Reason for consult:  Facility Placement                Permission sought to share information with:  Chartered certified accountant granted to share information::  Yes, Verbal Permission Granted  Name::      Christine [name]::   [facility]   Relationship::     Contact Information:     Housing/Transportation Living arrangements for the past 2 months:  [place] of Information:  Patient, Power of [place], Adult Children, Other (Comment Required) (Granddaughter Christine [name] ) Patient Interpreter Needed:  None Criminal Activity/Legal Involvement Pertinent to Current Situation/Hospitalization:  No - Comment as needed Significant Relationships:  Adult Children, Other Family Members Lives with:  Self Do you feel safe going back to the place where you live?  Yes Need for family participation in patient care:  Yes (Comment)  Care giving concerns:  Patient lives alone in [place].    Social Worker assessment / plan:  Holiday representative (CSW) met with patient and her daughter HPOA Christine [name] and granddaughter Christine [name] were at bedside. Patient is scheduled for surgery today for a hip fracture. Patient described falling in the kitchen this morning as she had chicken in the oven and was making tea. Patient reported that she lives alone in [place] and was independent prior to the fall. Daughter Christine [name] lives in [place] and Curator Christine [name] lives in [place] and is a Marine scientist with [employer]. CSW explained to patient and family that PT will work with patient after surgery and make a recommendation of home health or SNF. Per granddaughter her top 3 choices for SNF are [facility], [facility], and [facility]. Patient and family are agreeable to SNF search in [place]. CSW also explained that  patient's insurance [employer] will require authorization. Daughter verbalized her understanding.   FL2 complete and on chart.   Employment status:  Retired Nurse, adult PT Recommendations:  Not assessed at this time Information / Referral to community resources:  [facility]  Patient/Family's Response to care:  Patient and family are agreeable to [facility].   Patient/Family's Understanding of and Emotional Response to Diagnosis, Current Treatment, and Prognosis: Patient and family were pleasant throughout assessment and thanked CSW for visit.   Emotional Assessment Appearance:  Appears stated age Attitude/Demeanor/Rapport:    Affect (typically observed):  Accepting, Adaptable, Pleasant Orientation:  Oriented to Self, Oriented to Place, Oriented to  Time, Oriented to Situation Alcohol / Substance use:  Not Applicable Psych involvement (Current and /or in the community):  No (Comment)  Discharge Needs  Concerns to be addressed:  Discharge Planning Concerns Readmission within the last 30 days:  No Current discharge risk:  Dependent with Mobility Barriers to Discharge:  Continued Medical Work up   Christine Freshwater, LCSW 01/09/2015, 1:51 PM

## 2015-01-09 NOTE — Progress Notes (Signed)
OR staff here to take patient to surgery, consents on chart, family at the bedside. abx sent in chart.

## 2015-01-09 NOTE — Anesthesia Preprocedure Evaluation (Signed)
Anesthesia Evaluation  Patient identified by MRN, date of birth, ID band Patient awake    Reviewed: Allergy & Precautions, NPO status , Patient's Chart, lab work & pertinent test results  History of Anesthesia Complications (+) PROLONGED EMERGENCE and history of anesthetic complications  Airway Mallampati: II       Dental  (+) Chipped   Pulmonary neg pulmonary ROS,           Cardiovascular negative cardio ROS       Neuro/Psych negative neurological ROS     GI/Hepatic negative GI ROS, Neg liver ROS,   Endo/Other  Hypothyroidism   Renal/GU      Musculoskeletal  (+) Arthritis , Osteoarthritis,    Abdominal   Peds  Hematology negative hematology ROS (+)   Anesthesia Other Findings   Reproductive/Obstetrics                             Anesthesia Physical Anesthesia Plan  ASA: III  Anesthesia Plan: Spinal   Post-op Pain Management:    Induction:   Airway Management Planned: Nasal Cannula  Additional Equipment:   Intra-op Plan:   Post-operative Plan:   Informed Consent: I have reviewed the patients History and Physical, chart, labs and discussed the procedure including the risks, benefits and alternatives for the proposed anesthesia with the patient or authorized representative who has indicated his/her understanding and acceptance.     Plan Discussed with:   Anesthesia Plan Comments:         Anesthesia Quick Evaluation

## 2015-01-09 NOTE — Transfer of Care (Signed)
Immediate Anesthesia Transfer of Care Note  Patient: Christine [name]  Procedure(s) Performed: Procedure(s): INTRAMEDULLARY (IM) NAIL INTERTROCHANTRIC (Right)  Patient Location: PACU  Anesthesia Type:Spinal  Level of Consciousness: awake, alert , oriented and patient cooperative  Airway & Oxygen Therapy: Patient Spontanous Breathing  Post-op Assessment: Report given to RN and Post -op Vital signs reviewed and stable  Post vital signs: Reviewed and stable  Last Vitals:  Filed Vitals:   01/09/15 1804  BP: 122/54  Pulse: 81  Temp: 36 C  Resp: 19    Complications: No apparent anesthesia complications

## 2015-01-09 NOTE — Consult Note (Signed)
ORTHOPAEDIC CONSULTATION  REQUESTING PHYSICIAN: Gladstone Lighter, MD  Chief Complaint:   Right hip pain.  History of Present Illness: Christine [name] is a 81 y.o. female and otherwise good health who lives independently. Apparently, she was in her kitchen this morning when she turned to head toward the refrigerator. She lost her balance and fell onto her right hip. She was unable to get up so, she was brought to the emergency room where x-rays were obtained and demonstrated the findings as described below. She is admitted at this time for definitive management of her injury pending medical clearance. The patient denies any loss of consciousness or other associated injuries, other than skinning her right elbow. She also denies any lightheadedness, dizziness, shortness of breath, chest pain, or other symptoms that may have precipitated her fall. 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  Past Medical History  Diagnosis Date  . Hearing loss   . Wears glasses   . Cancer ([place])     colon  . Carpal tunnel syndrome   . Hypothyroidism   . Osteoporosis   . DJD (degenerative joint disease)    Past Surgical History  Procedure Laterality Date  . Thyroid surgery  1967    for multinodular goitre  . Lung surgery      as a child for empyema  . Colon resection      for colon cancer  . Abdominal hysterectomy    . Oophorectomy     Social History   Social History  . Marital Status: Single    Spouse Name: N/A  . Number of Children: N/A  . Years of Education: N/A   Social History Main Topics  . Smoking status: Never Smoker   . Smokeless tobacco: None  . Alcohol Use: No  . Drug Use: None  . Sexual Activity: Not Currently   Other Topics Concern  . None   Social History Narrative   Lives at home by  herself   Family History  Problem Relation Age of Onset  . Heart disease Father   . Diabetes Sister   . Cancer Sister   . Diabetes Brother   . Cancer Brother   . Cancer Mother    No Known Allergies Prior to Admission medications   Medication Sig Start Date End Date Taking? Authorizing Provider  aspirin EC 81 MG tablet Take 81 mg by mouth daily as needed for mild pain or moderate pain.   Yes Historical Provider, MD  Calcium Carb-Cholecalciferol (CALCIUM 600 + D) 600-200 MG-UNIT TABS Take 1 tablet by mouth 2 (two) times daily. With lunch and dinner   Yes Historical Provider, MD  Cholecalciferol (VITAMIN D) 1000 UNITS capsule Take 1,000 Units by mouth every evening.    Yes Historical Provider, MD  levothyroxine (SYNTHROID, LEVOTHROID) 100 MCG tablet Take 100 mcg by mouth daily.   Yes Historical Provider, MD   Dg Chest 1 View  01/09/2015  CLINICAL DATA:  Pain following fall EXAM: CHEST 1 VIEW COMPARISON:  April 23, 2010 FINDINGS: There is no edema or consolidation. Heart is upper normal in size with pulmonary vascularity within normal limits. No adenopathy. No pneumothorax. Bones are diffusely osteoporotic. There are surgical clips in the upper thoracic region, midline. IMPRESSION: No edema or consolidation. Bones diffusely osteoporotic. No pneumothorax apparent. Electronically Signed   By: Lowella Grip III M.D.  On: 01/09/2015 10:33   Dg Knee 2 Views Left  01/09/2015  CLINICAL DATA:  Pain following fall EXAM: LEFT KNEE - 1-2 VIEW COMPARISON:  None. FINDINGS: Frontal and lateral views were obtained. No fracture or dislocation. No appreciable joint effusion. There is mild narrowing medially. Bones are diffusely osteoporotic. IMPRESSION: No fracture or joint effusion. Narrowing medially. Bones osteoporotic. Electronically Signed   By: Lowella Grip III M.D.   On: 01/09/2015 10:31   Dg Knee 2 Views Right  01/09/2015  CLINICAL DATA:  Status post fall this morning. Right knee pain.  Initial encounter. EXAM: RIGHT KNEE - 1-2 VIEW COMPARISON:  None. FINDINGS: The AP view is limited as a portion of the fibular head is off the margin of film. The tech reports positioning the patient was difficult due to her condition. No acute bony or joint abnormality is seen. No notable degenerative change. IMPRESSION: No acute abnormality. Electronically Signed   By: Inge Rise M.D.   On: 01/09/2015 10:34   Dg Hip Unilat With Pelvis 2-3 Views Right  01/09/2015  CLINICAL DATA:  Pain following fall EXAM: DG HIP (WITH OR WITHOUT PELVIS) 2-3V RIGHT COMPARISON:  None. FINDINGS: Frontal pelvis as well as frontal and lateral right hip images were obtained. There is a comminuted intertrochanteric femur fracture on the right with varus angulation at the fracture site. No other fractures are identified. No dislocation. There is mild symmetric narrowing of both hip joints. There is degenerative change in the lower lumbar spine. Bones are diffusely osteoporotic. IMPRESSION: Comminuted intertrochanteric femur fracture on the right with varus angulation at the fracture site. No dislocation. Narrowing both hip joints. Bones diffusely osteoporotic. Electronically Signed   By: Lowella Grip III M.D.   On: 01/09/2015 10:31    Positive ROS: All other systems have been reviewed and were otherwise negative with the exception of those mentioned in the HPI and as above.  Physical Exam: General:  Alert, no acute distress Psychiatric:  Patient is competent for consent with normal mood and affect   Cardiovascular:  No pedal edema Respiratory:  No wheezing, non-labored breathing GI:  Abdomen is soft and non-tender Skin:  No lesions in the area of chief complaint Neurologic:  Sensation intact distally Lymphatic:  No axillary or cervical lymphadenopathy  Orthopedic Exam:  Orthopedic examination is limited to the right hip and lower extremity. The right lower extremity is somewhat shortened and externally  rotated as compared to the left. Skin inspection around the hip is unremarkable. She has tenderness to palpation over the lateral aspect of the hip. She has more moderate pain with any attempted active or passive motion of the hip. She is able to actively dorsiflex and plantarflex her toes and ankle. Sensation is intact to light touch to all restrictions. She has good capillary refill to her foot.  X-rays:  X-rays of the pelvis and right hip are available for review. These films demonstrate a displaced three-part intertrochanteric fracture of her right hip. Mild degenerative changes of the hip joint are noted as manifested by mild narrowing of the clear space. No lytic lesions are identified.  Assessment: 3 part intertrochanteric fracture right hip.  Plan: The treatment options are discussed with the patient and her family. In particular, the procedure of a trochanteric femoral nailing of the right hip has been discussed, as have the potential risks (including bleeding, infection, nerve and/or blood vessel injury, persistent or recurrent pain, stiffness, malunion and/or nonunion, leg length inequality, need for further surgery, blood  clots, strokes, heart attacks and/or arrhythmias, etc.) and benefits. The patient and her family states her understanding and wished to proceed. A consent will be obtained by the nursing staff.  Thank you for ask me to participate in the care of this most pleasant woman. I will be happy to follow her with you.   Pascal Lux, MD  Beeper #:  [phone]  01/09/2015 12:50 PM

## 2015-01-10 ENCOUNTER — Encounter: Payer: Self-pay | Admitting: Surgery

## 2015-01-10 LAB — BASIC METABOLIC PANEL
Anion gap: 8 (ref 5–15)
BUN: 19 mg/dL (ref 6–20)
CALCIUM: 7.9 mg/dL — AB (ref 8.9–10.3)
CO2: 28 mmol/L (ref 22–32)
CREATININE: 0.99 mg/dL (ref 0.44–1.00)
Chloride: 104 mmol/L (ref 101–111)
GFR, EST AFRICAN AMERICAN: 56 mL/min — AB (ref >60)
GFR, EST NON AFRICAN AMERICAN: 48 mL/min — AB (ref >60)
Glucose, Bld: 160 mg/dL — ABNORMAL HIGH (ref 65–99)
Potassium: 4.4 mmol/L (ref 3.5–5.1)
SODIUM: 140 mmol/L (ref 135–145)

## 2015-01-10 LAB — CBC
HCT: 26.8 % — ABNORMAL LOW (ref 35.0–47.0)
Hemoglobin: 9.1 g/dL — ABNORMAL LOW (ref 12.0–16.0)
MCH: 33 pg (ref 26.0–34.0)
MCHC: 33.9 g/dL (ref 32.0–36.0)
MCV: 97.2 fL (ref 80.0–100.0)
PLATELETS: 167 10*3/uL (ref 150–440)
RBC: 2.76 MIL/uL — AB (ref 3.80–5.20)
RDW: 12.9 % (ref 11.5–14.5)
WBC: 7.5 10*3/uL (ref 3.6–11.0)

## 2015-01-10 MED ORDER — TRAMADOL HCL 50 MG PO TABS: 50.0000 mg | ORAL_TABLET | Freq: Four times a day (QID) | ORAL | Status: DC | PRN

## 2015-01-10 MED ORDER — TAMSULOSIN HCL 0.4 MG PO CAPS
0.4000 mg | ORAL_CAPSULE | Freq: Every day | ORAL | Status: DC
  Administered 2015-01-10 – 2015-01-13 (×4): 0.4 mg via ORAL
  Filled 2015-01-10 (×4): qty 1

## 2015-01-10 NOTE — Progress Notes (Signed)
Dr.Singh here to see patient, informed of decrease urine output previous shift, foley still inplace, Per Dr.Singh leave foley in till am and monitor urine output.

## 2015-01-10 NOTE — Progress Notes (Signed)
  Subjective: 1 Day Post-Op Procedure(s) (LRB): INTRAMEDULLARY (IM) NAIL INTERTROCHANTRIC (Right) Patient reports pain as mild.  Pain becomes more severe with ambulation. Patient is well, but has had some minor complaints of urinary retention. Plan is to go Skilled nursing facility after hospital stay.  Pt has chosen [facility]. Negative for chest pain and shortness of breath Fever: no Gastrointestinal:negative for nausea and vomiting  Objective: Vital signs in last 24 hours: Temp:  [96.8 F (36 C)-98.2 F (36.8 C)] 98 F (36.7 C) (10/25 0840) Pulse Rate:  [59-101] 79 (10/25 0840) Resp:  [12-27] 17 (10/25 0840) BP: (108-159)/(44-64) 143/50 mmHg (10/25 0840) SpO2:  [92 %-100 %] 92 % (10/25 0840)  Intake/Output from previous day:  Intake/Output Summary (Last 24 hours) at 01/10/15 1017 Last data filed at 01/10/15 0845  Gross per 24 hour  Intake 2280.5 ml  Output   1025 ml  Net 1255.5 ml    Intake/Output this shift: Total I/O In: 127.5 [I.V.:127.5] Out: 125 [Urine:125]  Labs:  Recent Labs  01/09/15 0913 01/10/15 0428  HGB 12.4 9.1*    Recent Labs  01/09/15 0913 01/10/15 0428  WBC 12.2* 7.5  RBC 3.92 2.76*  HCT 38.0 26.8*  PLT 219 167    Recent Labs  01/09/15 0913 01/10/15 0428  NA 139 140  K 3.9 4.4  CL 102 104  CO2 27 28  BUN 17 19  CREATININE 0.94 0.99  GLUCOSE 166* 160*  CALCIUM 8.9 7.9*    Recent Labs  01/09/15 0913  INR 0.97     EXAM General - Patient is Alert, Appropriate and Oriented Extremity - Neurologically intact ABD soft Sensation intact distally Intact pulses distally Dorsiflexion/Plantar flexion intact Incision: dressing C/D/I Dressing/Incision - clean, dry, no drainage Motor Function - intact, moving foot and toes well on exam.   Abdomen is soft with normal bowel sounds. Bulky dressing intact today, will change tomorrow.  Past Medical History  Diagnosis Date  . Hearing loss   . Wears glasses   . Cancer ([place])    colon  . Carpal tunnel syndrome   . Hypothyroidism   . Osteoporosis   . DJD (degenerative joint disease)     Assessment/Plan: 1 Day Post-Op Procedure(s) (LRB): INTRAMEDULLARY (IM) NAIL INTERTROCHANTRIC (Right) Active Problems:   Hip fracture (HCC)  Estimated body mass index is 19.97 kg/(m^2) as calculated from the following:   Height as of this encounter: 5\' 5"  (1.651 m).   Weight as of this encounter: 54.432 kg (120 lb). Advance diet Up with therapy   Pt is doing well.  Pt has decreased urine output, Foley in place. Pt needs to have a BM. Labs reviewed, CBC and BMP ordered for tomorrow morning. Bulky dressing intact, will change tomorrow morning. Continue working with therapy.  Pt will be going to [facility] following discharge.  DVT Prophylaxis - Lovenox, Foot Pumps and TED hose Weight-Bearing as tolerated to right leg  J. Cameron Proud, PA-C [facility] Orthopaedic Surgery 01/10/2015, 10:17 AM

## 2015-01-10 NOTE — Clinical Documentation Improvement (Signed)
Orthopedic, Ortho PA's  Please clarify with a diagnosis and it's specificity, a condition associated with the below clinical indicators. Please update your documentation within the medical record to reflect your response to this query. Thank you!   Other  Clinically Undetermined  Supporting Information:  H&H has dropped 3gms within 24 hours following an IM nailing (12.4 / 38  to  9.1 / 26.8 )  Serially monitoring H&H's daily  Please exercise your independent, professional judgment when responding. A specific answer is not anticipated or expected.  Thank You, Zoila Shutter RN, [place] [phone]

## 2015-01-10 NOTE — Progress Notes (Signed)
Subjective: Patient is lying comfortably in bed. She complains of mild right hip discomfort, aggravated by right hip movements. No complaint of shortness of breath or chest pain. No abdominal pain. Nurse at bedside.  Objective: Vital signs in last 24 hours: Temp:  [96.8 F (36 C)-98.2 F (36.8 C)] 98.1 F (36.7 C) (10/25 1139) Pulse Rate:  [70-101] 81 (10/25 1139) Resp:  [15-20] 17 (10/25 0840) BP: (108-159)/(44-59) 149/52 mmHg (10/25 1139) SpO2:  [92 %-100 %] 98 % (10/25 1139) Weight change:  Last BM Date: 01/09/15  Intake/Output from previous day: 10/24 0701 - 10/25 0700 In: 2153 [P.O.:360; I.V.:1693; IV Piggyback:100] Out: 900 [Urine:850; Blood:50] Intake/Output this shift: Total I/O In: 397.5 [I.V.:397.5] Out: 125 [Urine:125]  GENERAL:alert, oriented x 3, no acute distress.  EYES: no pallor, no icterus, extraocular muscles are intact.  HEENT: Head atraumatic, normocephalic. Oropharynx and nasopharynx clear.  NECK: supple, no jugular venous distention. No thyroid enlargement, no tenderness.  LUNGS: clear to auscultation. No wheezing, rhonchi or crepitation. No use of accessory muscles of respiration.  CARDIOVASCULAR: regular rate and rhythm, S1, S2 normal. No tachycardia.  ABDOMEN: Soft, non-tender, normal bowel sounds, no organomegaly or mass.  EXTREMITIES: Right hip dressing is intact. No pedal edema, cyanosis, or clubbing.  NEUROLOGIC: normal strength and tone in bilateral upper and lower extremities, sensations are grossly intact, normal speech. SKIN: No obvious rash, lesion, or ulcer.   Lab Results:  Recent Labs  01/09/15 0913 01/10/15 0428  WBC 12.2* 7.5  HGB 12.4 9.1*  HCT 38.0 26.8*  PLT 219 167   BMET  Recent Labs  01/09/15 0913 01/10/15 0428  NA 139 140  K 3.9 4.4  CL 102 104  CO2 27 28  GLUCOSE 166* 160*  BUN 17 19  CREATININE 0.94 0.99  CALCIUM 8.9 7.9*    Studies/Results: Dg Chest 1 View  01/09/2015  CLINICAL DATA:  Pain  following fall EXAM: CHEST 1 VIEW COMPARISON:  April 23, 2010 FINDINGS: There is no edema or consolidation. Heart is upper normal in size with pulmonary vascularity within normal limits. No adenopathy. No pneumothorax. Bones are diffusely osteoporotic. There are surgical clips in the upper thoracic region, midline. IMPRESSION: No edema or consolidation. Bones diffusely osteoporotic. No pneumothorax apparent. Electronically Signed   By: Lowella Grip III M.D.   On: 01/09/2015 10:33   Dg Knee 2 Views Left  01/09/2015  CLINICAL DATA:  Pain following fall EXAM: LEFT KNEE - 1-2 VIEW COMPARISON:  None. FINDINGS: Frontal and lateral views were obtained. No fracture or dislocation. No appreciable joint effusion. There is mild narrowing medially. Bones are diffusely osteoporotic. IMPRESSION: No fracture or joint effusion. Narrowing medially. Bones osteoporotic. Electronically Signed   By: Lowella Grip III M.D.   On: 01/09/2015 10:31   Dg Knee 2 Views Right  01/09/2015  CLINICAL DATA:  Status post fall this morning. Right knee pain. Initial encounter. EXAM: RIGHT KNEE - 1-2 VIEW COMPARISON:  None. FINDINGS: The AP view is limited as a portion of the fibular head is off the margin of film. The tech reports positioning the patient was difficult due to her condition. No acute bony or joint abnormality is seen. No notable degenerative change. IMPRESSION: No acute abnormality. Electronically Signed   By: Inge Rise M.D.   On: 01/09/2015 10:34   Dg Hip Operative Unilat With Pelvis Right  01/09/2015  CLINICAL DATA:  Post surgery EXAM: OPERATIVE right HIP (WITH PELVIS IF PERFORMED) 4 VIEWS TECHNIQUE: Fluoroscopic spot image(s) were submitted  for interpretation post-operatively. COMPARISON:  01/09/2015 FINDINGS: Four views of the right hip submitted. The patient is status post intraoperative repair of right femoral intertrochanteric fracture. A intra medullary rod and metallic fixation pin is noted in  right femur. There is anatomic alignment. IMPRESSION: The patient is status post intraoperative repair of right femoral intertrochanteric fracture. A intra medullary rod and metallic fixation pin is noted in right femur. There is anatomic alignment. Fluoroscopy time 47 seconds. Electronically Signed   By: Lahoma Crocker M.D.   On: 01/09/2015 17:55   Dg Hip Unilat With Pelvis 2-3 Views Right  01/09/2015  CLINICAL DATA:  Pain following fall EXAM: DG HIP (WITH OR WITHOUT PELVIS) 2-3V RIGHT COMPARISON:  None. FINDINGS: Frontal pelvis as well as frontal and lateral right hip images were obtained. There is a comminuted intertrochanteric femur fracture on the right with varus angulation at the fracture site. No other fractures are identified. No dislocation. There is mild symmetric narrowing of both hip joints. There is degenerative change in the lower lumbar spine. Bones are diffusely osteoporotic. IMPRESSION: Comminuted intertrochanteric femur fracture on the right with varus angulation at the fracture site. No dislocation. Narrowing both hip joints. Bones diffusely osteoporotic. Electronically Signed   By: Lowella Grip III M.D.   On: 01/09/2015 10:31    Medications:  Current Facility-Administered Medications  Medication Dose Route Frequency Provider Last Rate Last Dose  . acetaminophen (TYLENOL) tablet 650 mg  650 mg Oral Q6H PRN Corky Mull, MD       Or  . acetaminophen (TYLENOL) suppository 650 mg  650 mg Rectal Q6H PRN Corky Mull, MD      . ALPRAZolam Duanne Moron) tablet 0.25 mg  0.25 mg Oral BID PRN Gladstone Lighter, MD      . bisacodyl (DULCOLAX) suppository 10 mg  10 mg Rectal Daily PRN Corky Mull, MD      . calcium-vitamin D (OSCAL WITH D) 500-200 MG-UNIT per tablet 1 tablet  1 tablet Oral BID Gladstone Lighter, MD   1 tablet at 01/10/15 0900  . cholecalciferol (VITAMIN D) tablet 1,000 Units  1,000 Units Oral QPM Gladstone Lighter, MD   1,000 Units at 01/09/15 1804  . dextrose 5 % and 0.9 %  NaCl with KCl 20 mEq/L infusion   Intravenous Continuous Corky Mull, MD 75 mL/hr at 01/10/15 1033    . diphenhydrAMINE (BENADRYL) 12.5 MG/5ML elixir 12.5-25 mg  12.5-25 mg Oral Q4H PRN Corky Mull, MD      . docusate sodium (COLACE) capsule 100 mg  100 mg Oral BID Corky Mull, MD   100 mg at 01/10/15 0900  . enoxaparin (LOVENOX) injection 40 mg  40 mg Subcutaneous Q24H Corky Mull, MD   40 mg at 01/10/15 0736  . fentaNYL (SUBLIMAZE) injection 25 mcg  25 mcg Intravenous Q5 min PRN Gunnar Fusi, MD   25 mcg at 01/09/15 1820  . HYDROmorphone (DILAUDID) injection 0.5-1 mg  0.5-1 mg Intravenous Q2H PRN Corky Mull, MD   0.5 mg at 01/09/15 2256  . levothyroxine (SYNTHROID, LEVOTHROID) tablet 100 mcg  100 mcg Oral Maury Dus, MD   100 mcg at 01/10/15 0631  . magnesium hydroxide (MILK OF MAGNESIA) suspension 30 mL  30 mL Oral Daily PRN Corky Mull, MD   30 mL at 01/10/15 0631  . metoCLOPramide (REGLAN) tablet 5-10 mg  5-10 mg Oral Q8H PRN Corky Mull, MD       Or  .  metoCLOPramide (REGLAN) injection 5-10 mg  5-10 mg Intravenous Q8H PRN Corky Mull, MD      . ondansetron [facility]) injection 4 mg  4 mg Intravenous Once PRN Gunnar Fusi, MD      . ondansetron [facility]) tablet 4 mg  4 mg Oral Q6H PRN Corky Mull, MD       Or  . ondansetron [facility]) injection 4 mg  4 mg Intravenous Q6H PRN Corky Mull, MD      . oxyCODONE (Oxy IR/ROXICODONE) immediate release tablet 5-10 mg  5-10 mg Oral Q3H PRN Corky Mull, MD   10 mg at 01/10/15 0736  . pantoprazole (PROTONIX) EC tablet 40 mg  40 mg Oral BID Corky Mull, MD   40 mg at 01/10/15 0859  . polyethylene glycol (MIRALAX / GLYCOLAX) packet 17 g  17 g Oral Daily PRN Gladstone Lighter, MD      . simethicone (MYLICON) chewable tablet 80 mg  80 mg Oral QID PRN Gladstone Lighter, MD      . sodium chloride 0.9 % injection 3 mL  3 mL Intravenous Q12H Gladstone Lighter, MD   3 mL at 01/09/15 1311  . sodium phosphate (FLEET) 7-19 GM/118ML  enema 1 enema  1 enema Rectal Once PRN Corky Mull, MD      . tamsulosin Continuous [facility]) capsule 0.4 mg  0.4 mg Oral Daily Lattie Corns, PA-C   0.4 mg at 01/10/15 1138  . traMADol (ULTRAM) tablet 50-100 mg  50-100 mg Oral Q6H PRN Lattie Corns, PA-C         Assessment/Plan:  81 year old lady history of hypothyroidism and osteoporosis, admitted with right hip fracture, status post mechanical fall. Patient underwent placement of intramedullary nail earlier today. Pain is controlled. Continue physical therapy. DVT prophylaxis as ordered.  Low urine output: foley catheter in place. Started on Flomax. Monitor I's and O's. Drop in hemoglobin from 12.4 to 9.1, repeat hemoglobin and hematocrit in a.m. Plan is to discharge to [facility] for rehabilitation.     LOS: 1 day   Sharonne Ricketts 01/10/2015, 11:57 AM

## 2015-01-10 NOTE — Progress Notes (Signed)
[facility] internal med called waiting on callback low urine output this shift.

## 2015-01-10 NOTE — Progress Notes (Signed)
Clinical Social Worker (CSW) met with patient and her daughter Christine [name] was at bedside. CSW presented bed offers. Patient and daughter chose [facility] admissions coordinator at [facility] is aware of accepted offer. CSW contacted [place] and made her aware of above. Plan is for patient to D/C to [facility] on Thursday 01/12/15. CSW will continue to follow and assist as needed.   Blima Rich, [place] [phone]

## 2015-01-10 NOTE — Progress Notes (Signed)
[facility] med paged waiting on callback

## 2015-01-10 NOTE — Evaluation (Signed)
Physical Therapy Evaluation Patient Details Name: Christine [name] MRN: [id number] DOB: Nov 26, 1920 Today's Date: 01/10/2015   History of Present Illness  81 y.o. female with a known history of colon cancer in remission now, hypothyroidism, osteoporosis, hearing loss presents to the hospital after she had a mechanical fall and suffers from right hip fracture. Pt is s/p R ORIF repair of comminuted R intertrochanteric fx, WBAT.   Clinical Impression  Pt alert and oriented x 4 upon arrival to room. States that her hip does not bother her unless she attempts to move. Pt was motivated and cooperated throughout session despite being in considerable amount of pain. Pt was able to perform bed level therapeutic exercise in order to increase the strength of LEs without much c/o increased pain. Pt was able to perform supine <> sit transfer with cueing from therapist for sequencing of LEs toward side of bed. Pt able to actively move LLE toward EOB and actively moved the R leg with assistance from PT. Pt required mod assist for trunk control into upright sitting posture. Once in sitting, pt was able to maintain position for ~3-5 mins with unilateral UE support (L) and demonstrating a L lateral trunk lean. Pt requires further skilled acute PT services in order to increase capacity for functional mobility.     Follow Up Recommendations SNF    Equipment Recommendations  Rolling walker with 5" wheels    Recommendations for Other Services       Precautions / Restrictions Precautions Precautions: Fall Restrictions Weight Bearing Restrictions: Yes RLE Weight Bearing: Weight bearing as tolerated      Mobility  Bed Mobility Overal bed mobility: Needs Assistance Bed Mobility: Supine to Sit     Supine to sit: Mod assist     General bed mobility comments: pt able to move LLE out toward EOB independenlty, requires assistance to move RLE but is able to initiate the movement herself. Pt requires mod assist for  control of trunk into upright position on EOB. Extremely painful transition for pt but she does tolerated it,   Transfers Overall transfer level:  (no further transfers performed today due to safety concerns from sigificant pain. )                  Ambulation/Gait Ambulation/Gait assistance:  (see above)              Stairs            Wheelchair Mobility    Modified Rankin (Stroke Patients Only)       Balance Overall balance assessment: Needs assistance Sitting-balance support: Feet supported;Single extremity supported Sitting balance-Leahy Scale: Fair Sitting balance - Comments: LUE support required to maintain seated balance. Pt leans toward the L to unload weight off of R hip. Pt anxious/hesitant to sit at first but was able to maintain seated on EOB posture for ~3-74mins.  Postural control: Left lateral lean Standing balance support:  (no standing performed)                                 Pertinent Vitals/Pain Pain Assessment: 0-10 Pain Score: 10-Worst pain ever Pain Location: R hip when moving  Pain Descriptors / Indicators: Aching Pain Intervention(s): Limited activity within patient's tolerance;Monitored during session;Repositioned;Ice applied    Home Living Family/patient expects to be discharged to:: Skilled nursing facility Living Arrangements: Alone  Additional Comments: Pt was living independently at a one story home with 2 steps to enter (no railings) prior to hospitalization. Has a cane and walking stick that she uses inside and outside of the house. Pt has no adaptive equipment for the bathroom and has a tub/shower combo.     Prior Function Level of Independence: Independent with assistive device(s)         Comments: was cooking, cleaning, gardening all independently with assistance from cane or walking stick.     Hand Dominance        Extremity/Trunk Assessment   Upper Extremity Assessment:  Overall WFL for tasks assessed           Lower Extremity Assessment: Generalized weakness (gross strength 3+/5 on R (limited by pain), WNL strength on L)         Communication   Communication: HOH  Cognition Arousal/Alertness: Awake/alert Behavior During Therapy: WFL for tasks assessed/performed Overall Cognitive Status: Within Functional Limits for tasks assessed                      General Comments      Exercises Total Joint Exercises Ankle Circles/Pumps: AROM;Both;15 reps;Supine Quad Sets: Strengthening;Both;15 reps;Supine Gluteal Sets: Strengthening;Both;10 reps;Supine Towel Squeeze: Strengthening;Both;10 reps;Supine      Assessment/Plan    PT Assessment Patient needs continued PT services  PT Diagnosis Difficulty walking;Acute pain;Generalized weakness   PT Problem List Decreased strength;Decreased range of motion;Decreased activity tolerance;Decreased mobility;Decreased balance;Decreased coordination;Decreased knowledge of use of DME;Pain  PT Treatment Interventions DME instruction;Gait training;Stair training;Functional mobility training;Therapeutic activities;Therapeutic exercise;Balance training;Patient/family education   PT Goals (Current goals can be found in the Care Plan section) Acute Rehab PT Goals Patient Stated Goal: to get back to prior level of activity PT Goal Formulation: With patient Time For Goal Achievement: 01/10/15 Potential to Achieve Goals: Fair    Frequency BID   Barriers to discharge        Co-evaluation               End of Session   Activity Tolerance: Patient limited by pain Patient left: in bed;with call bell/phone within reach;with bed alarm set;Other (comment);with SCD's reapplied (PA -C in room )           Time: [id number] PT Time Calculation (min) (ACUTE ONLY): 31 min   Charges:         PT G Codes:        Moyses Pavey,SPT 01/10/2015, 10:45 AM

## 2015-01-10 NOTE — Anesthesia Postprocedure Evaluation (Signed)
  Anesthesia Post-op Note  Patient: Christine [name]  Procedure(s) Performed: Procedure(s): INTRAMEDULLARY (IM) NAIL INTERTROCHANTRIC (Right)  Anesthesia type:Spinal  Patient location: Floor  Post pain: Pain level controlled  Post assessment: Post-op Vital signs reviewed, Patient's Cardiovascular Status Stable, Respiratory Function Stable, Patent Airway and No signs of Nausea or vomiting  Post vital signs: Reviewed and stable  Last Vitals:  Filed Vitals:   01/10/15 0329  BP: 135/59  Pulse: 78  Temp: 36.7 C  Resp: 18    Level of consciousness: awake, alert  and patient cooperative  Complications: No apparent anesthesia complications

## 2015-01-10 NOTE — Progress Notes (Signed)
Dr.Poggi here to see pt, informed of decrease urine output previous shift, foley still in place, continue to monitor the urine output.

## 2015-01-10 NOTE — Plan of Care (Signed)
Problem: Phase II Progression Outcomes Goal: CMS/Neurovascular status WDL Outcome: Completed/Met Date Met:  01/10/15 Neuro checks wdl Goal: Tolerating diet Outcome: Progressing Was able to tolerate clear liquids. Goal: Discharge plan established Outcome: Progressing Pt will probably need SNF.  Problem: Phase III Progression Outcomes Goal: Incision clean - minimal/no drainage Outcome: Completed/Met Date Met:  01/10/15 Dressing is dry and intact.

## 2015-01-10 NOTE — Progress Notes (Signed)
Physical Therapy Treatment Patient Details Name: Christine [name] MRN: [id number] DOB: [birth year] Today's Date: 01/10/2015    History of Present Illness 81 y.o. female with a known history of colon cancer in remission now, hypothyroidism, osteoporosis, hearing loss presents to the hospital after she had a mechanical fall and suffers from right hip fracture. Pt is s/p R ORIF repair of comminuted R intertrochanteric fx, WBAT.     PT Comments    Pt was able to display improved capacity for functional mobility this afternoon. Was able to perform more efficient sequencing of LEs toward the EOB but still required assist with movement of RLE due to pain. Pt performed greater ability to rotate trunk and pull up on bed rails but required mod assistance for control of trunk in order to complete transfer. Once in sitting on EOB pt tolerates well and is able to maintain without c/o pain or BUE support. Pt completed sit <>stand transfer with mod assist and vc for placement of BUEs on bed for boost into standing. Once in standing pt was able to maintain position with mod assist and BUE support on RW. Pt will continue to benefit from skilled acute PT services in order to increase functional mobility and strength.   Follow Up Recommendations  SNF     Equipment Recommendations  Rolling walker with 5" wheels    Recommendations for Other Services       Precautions / Restrictions Precautions Precautions: Fall Restrictions Weight Bearing Restrictions: Yes RLE Weight Bearing: Weight bearing as tolerated    Mobility  Bed Mobility Overal bed mobility: Needs Assistance Bed Mobility: Supine to Sit     Supine to sit: Mod assist     General bed mobility comments: pt did better with tolerating supine<>sitting transfer this afternoon. Performed LE and trunk rotation sequencing with greater independence. Still requires mod assistance with RLE and trunk to achieve full sitting position.   Transfers Overall  transfer level: Needs assistance Equipment used: Rolling walker (2 wheeled) Transfers: Sit to/from Stand Sit to Stand: Max assist         General transfer comment: vc given for hand placement during transfer; instruced pt to place hands on bed to push through in order to boost into standing. Cues for pushing through LLE.   Ambulation/Gait Ambulation/Gait assistance:  (no gait performed)               Stairs            Wheelchair Mobility    Modified Rankin (Stroke Patients Only)       Balance Overall balance assessment: Needs assistance Sitting-balance support: No upper extremity supported;Feet supported Sitting balance-Leahy Scale: Good Sitting balance - Comments: pt maintained seated balance better this afternoon. demonstrated ability to sit without UE support and less L lateral lean.  Postural control: Left lateral lean Standing balance support: Bilateral upper extremity supported;During functional activity Standing balance-Leahy Scale: Fair Standing balance comment: once up. pt tolerated standing well and without pain ~1-2 minutes                    Cognition Arousal/Alertness: Awake/alert Behavior During Therapy: WFL for tasks assessed/performed Overall Cognitive Status: Within Functional Limits for tasks assessed                      Exercises Total Joint Exercises Ankle Circles/Pumps: AROM;Both;15 reps;Supine    General Comments        Pertinent Vitals/Pain Pain Assessment:  (denies pain  when stationary in bed; does not rate pain with movement) Pain Intervention(s): Limited activity within patient's tolerance;Monitored during session;Repositioned    Home Living                      Prior Function            PT Goals (current goals can now be found in the care plan section) Acute Rehab PT Goals Patient Stated Goal: to get back to prior level of activity PT Goal Formulation: With patient Time For Goal Achievement:  01/10/15 Potential to Achieve Goals: Fair Progress towards PT goals: Progressing toward goals    Frequency  BID    PT Plan Current plan remains appropriate    Co-evaluation             End of Session Equipment Utilized During Treatment: Gait belt Activity Tolerance: Patient limited by pain Patient left: in bed;with call bell/phone within reach;with bed alarm set;with SCD's reapplied     Time: 1417-1440 PT Time Calculation (min) (ACUTE ONLY): 23 min  Charges:                       G CodesMilon Score 01/10/2015, 3:35 PM

## 2015-01-10 NOTE — Progress Notes (Signed)
Spoke with Dr. Ouida Sills about low urine output no orders at this time. Continue to monitor.

## 2015-01-10 NOTE — Care Management (Signed)
PT recommending SNF and patient agrees. RNCM will follow along with CSW.

## 2015-01-10 NOTE — Anesthesia Post-op Follow-up Note (Cosign Needed)
  Anesthesia Pain Follow-up Note  Patient: Christine [name]  Day #: 1  Date of Follow-up: 01/10/2015 Time: 7:26 AM  Last Vitals:  Filed Vitals:   01/10/15 0329  BP: 135/59  Pulse: 78  Temp: 36.7 C  Resp: 18    Level of Consciousness: alert  Pain: mild   Side Effects:None  Catheter Site Exam:not evaluated  Plan: D/C from anesthesia care  Blima Singer

## 2015-01-11 LAB — BASIC METABOLIC PANEL
ANION GAP: 6 (ref 5–15)
BUN: 14 mg/dL (ref 6–20)
CHLORIDE: 101 mmol/L (ref 101–111)
CO2: 28 mmol/L (ref 22–32)
Calcium: 7.5 mg/dL — ABNORMAL LOW (ref 8.9–10.3)
Creatinine, Ser: 0.88 mg/dL (ref 0.44–1.00)
GFR calc non Af Amer: 55 mL/min — ABNORMAL LOW (ref >60)
Glucose, Bld: 111 mg/dL — ABNORMAL HIGH (ref 65–99)
POTASSIUM: 4.4 mmol/L (ref 3.5–5.1)
SODIUM: 135 mmol/L (ref 135–145)

## 2015-01-11 LAB — CBC
HEMATOCRIT: 24.5 % — AB (ref 35.0–47.0)
HEMOGLOBIN: 8.2 g/dL — AB (ref 12.0–16.0)
MCH: 32.3 pg (ref 26.0–34.0)
MCHC: 33.4 g/dL (ref 32.0–36.0)
MCV: 96.8 fL (ref 80.0–100.0)
Platelets: 135 10*3/uL — ABNORMAL LOW (ref 150–440)
RBC: 2.53 MIL/uL — AB (ref 3.80–5.20)
RDW: 13.5 % (ref 11.5–14.5)
WBC: 8.1 10*3/uL (ref 3.6–11.0)

## 2015-01-11 NOTE — Progress Notes (Signed)
Physical Therapy Treatment Patient Details Name: Christine [name] MRN: [id number] DOB: 07-31-1920 Today's Date: 01/11/2015    History of Present Illness 81 y.o. female with a known history of colon cancer in remission now, hypothyroidism, osteoporosis, hearing loss presents to the hospital after she had a mechanical fall and suffers from right hip fracture. Pt is s/p R ORIF repair of comminuted R intertrochanteric fx, WBAT.     PT Comments    Pt continues to demonstrate increases in functional mobility with each PT session. Pt ambulated x 10 ft w/in her room w/ RW and mod assist. Pt demonstrates antalgic gait pattern favoring RLE with associated gait deviations ( Shorter step length on L, decreased stance time on R). Pt does well with independent progression and steering of walker. Remains mod assist for sit<>stand and stand pivot transfer. Pt is motivated and participates fully with PT each session. Pt will continue to benefit from acute skilled PT in order to progress functional mobility.   Follow Up Recommendations  SNF     Equipment Recommendations  Rolling walker with 5" wheels    Recommendations for Other Services       Precautions / Restrictions Precautions Precautions: Fall Restrictions Weight Bearing Restrictions: Yes RLE Weight Bearing: Weight bearing as tolerated    Mobility  Bed Mobility Overal bed mobility:  (received in chair; no bed mobility performed) Bed Mobility: Supine to Sit     Supine to sit: Mod assist     General bed mobility comments: pt did better with independent sequencing of trunk and LEs toward EOB without vc. Pt still requires assistance for movement of RLE to control pain and to assist with trunk into upright posture.   Transfers Overall transfer level: Needs assistance Equipment used: Rolling walker (2 wheeled) Transfers: Sit to/from Stand Sit to Stand: Mod assist Stand pivot transfers: Mod assist       General transfer comment: Pt still  requires mod assist for sit <>stand/ stand pivot transfers; vc still needed for hand placement on arm rest of recliner for push off. Pt tends to stick RLE out in front in order to reduce WB due to pain.   Ambulation/Gait Ambulation/Gait assistance: Mod assist Ambulation Distance (Feet): 12 Feet Assistive device: Rolling walker (2 wheeled) Gait Pattern/deviations: Step-to pattern;Decreased step length - left;Decreased stance time - right;Decreased stride length;Antalgic   Gait velocity interpretation: <1.8 ft/sec, indicative of risk for recurrent falls General Gait Details: Pt takes normal step length with RLE; takes shorter step with LLE due to increased pain w/ RLE stance. Antalgic gait favoring RLE.    Stairs            Wheelchair Mobility    Modified Rankin (Stroke Patients Only)       Balance Overall balance assessment: Needs assistance Sitting-balance support: No upper extremity supported;Feet supported Sitting balance-Leahy Scale: Good     Standing balance support: Bilateral upper extremity supported;During functional activity Standing balance-Leahy Scale: Fair Standing balance comment: balance concerns due to pain                    Cognition Arousal/Alertness: Awake/alert Behavior During Therapy: WFL for tasks assessed/performed Overall Cognitive Status: Within Functional Limits for tasks assessed                      Exercises Total Joint Exercises Ankle Circles/Pumps: AROM;Both;Supine Quad Sets: Right;10 reps;Supine;Strengthening Gluteal Sets: Both;10 reps;Supine;Strengthening Towel Squeeze: Strengthening;Both;10 reps;Supine Heel Slides: AROM;Strengthening;Both;10 reps;Supine Hip ABduction/ADduction: AROM;Strengthening;Right;10 reps;Supine  General Comments        Pertinent Vitals/Pain Pain Assessment: No/denies pain (pt states that she feels nauseous) Pain Location: R hip pain with movement Pain Intervention(s): Monitored during  session;Limited activity within patient's tolerance    Home Living                      Prior Function            PT Goals (current goals can now be found in the care plan section) Acute Rehab PT Goals Patient Stated Goal: to get back to prior level of activity PT Goal Formulation: With patient Time For Goal Achievement: 01/10/15 Potential to Achieve Goals: Fair Progress towards PT goals: Progressing toward goals    Frequency  BID    PT Plan Current plan remains appropriate    Co-evaluation             End of Session Equipment Utilized During Treatment: Gait belt Activity Tolerance: Patient tolerated treatment well Patient left:  (on [facility] with nurse assistant in the room with her. )     Time: [id number] PT Time Calculation (min) (ACUTE ONLY): 20 min  Charges:  $Therapeutic Exercise: 8-22 mins $Therapeutic Activity: 8-22 mins                    G Codes:      Milon Score 01/11/2015, 2:35 PM

## 2015-01-11 NOTE — Progress Notes (Signed)
Subjective: Patient is out of bed in a chair. Ambulated with PT. Family member at bedside. She complains of nausea and belching. No abdominal pain.  No bowel movement post-op. Mild right hip discomfort, aggravated by right hip movements. No complaint of shortness of breath or chest pain.  Objective: Vital signs in last 24 hours: Temp:  [97.9 F (36.6 C)-100.2 F (37.9 C)] 98 F (36.7 C) (10/26 1147) Pulse Rate:  [73-83] 83 (10/26 1147) Resp:  [14-18] 14 (10/26 1147) BP: (112-164)/(39-51) 164/50 mmHg (10/26 1147) SpO2:  [93 %-97 %] 96 % (10/26 1147) Weight change:  Last BM Date: 01/09/15  Intake/Output from previous day: 10/25 0701 - 10/26 0700 In: 743.8 [I.V.:743.8] Out: 1550 [Urine:1550] Intake/Output this shift: Total I/O In: 1383.8 [I.V.:1383.8] Out: 550 [Urine:550]  GENERAL:alert, oriented x 3, no acute distress.  EYES: no pallor, no icterus, extraocular muscles are intact.  HEENT: Head atraumatic, normocephalic. Oropharynx and nasopharynx clear.  NECK: supple, no jugular venous distention. No thyroid enlargement, no tenderness.  LUNGS: clear to auscultation. No wheezing, rhonchi or crepitation. No use of accessory muscles of respiration.  CARDIOVASCULAR: regular rate and rhythm, S1, S2 normal. No tachycardia.  ABDOMEN: Soft, non-tender, normal bowel sounds, no organomegaly or mass.  EXTREMITIES: Right hip dressing is intact. No pedal edema, cyanosis, or clubbing.  NEUROLOGIC: normal strength and tone in bilateral upper and lower extremities, sensations are grossly intact, normal speech. SKIN: No obvious rash, lesion, or ulcer.   Lab Results:  Recent Labs  01/10/15 0428 01/11/15 0356  WBC 7.5 8.1  HGB 9.1* 8.2*  HCT 26.8* 24.5*  PLT 167 135*   BMET  Recent Labs  01/10/15 0428 01/11/15 0356  NA 140 135  K 4.4 4.4  CL 104 101  CO2 28 28  GLUCOSE 160* 111*  BUN 19 14  CREATININE 0.99 0.88  CALCIUM 7.9* 7.5*    Studies/Results: Dg Hip  Operative Unilat With Pelvis Right  01/09/2015  CLINICAL DATA:  Post surgery EXAM: OPERATIVE right HIP (WITH PELVIS IF PERFORMED) 4 VIEWS TECHNIQUE: Fluoroscopic spot image(s) were submitted for interpretation post-operatively. COMPARISON:  01/09/2015 FINDINGS: Four views of the right hip submitted. The patient is status post intraoperative repair of right femoral intertrochanteric fracture. A intra medullary rod and metallic fixation pin is noted in right femur. There is anatomic alignment. IMPRESSION: The patient is status post intraoperative repair of right femoral intertrochanteric fracture. A intra medullary rod and metallic fixation pin is noted in right femur. There is anatomic alignment. Fluoroscopy time 47 seconds. Electronically Signed   By: Lahoma Crocker M.D.   On: 01/09/2015 17:55    Medications:  Current Facility-Administered Medications  Medication Dose Route Frequency Provider Last Rate Last Dose  . acetaminophen (TYLENOL) tablet 650 mg  650 mg Oral Q6H PRN Corky Mull, MD       Or  . acetaminophen (TYLENOL) suppository 650 mg  650 mg Rectal Q6H PRN Corky Mull, MD      . ALPRAZolam Duanne Moron) tablet 0.25 mg  0.25 mg Oral BID PRN Gladstone Lighter, MD      . bisacodyl (DULCOLAX) suppository 10 mg  10 mg Rectal Daily PRN Corky Mull, MD      . calcium-vitamin D (OSCAL WITH D) 500-200 MG-UNIT per tablet 1 tablet  1 tablet Oral BID Gladstone Lighter, MD   1 tablet at 01/11/15 0851  . cholecalciferol (VITAMIN D) tablet 1,000 Units  1,000 Units Oral QPM Gladstone Lighter, MD   1,000 Units at  01/10/15 1727  . dextrose 5 % and 0.9 % NaCl with KCl 20 mEq/L infusion   Intravenous Continuous Corky Mull, MD 75 mL/hr at 01/11/15 1052    . diphenhydrAMINE (BENADRYL) 12.5 MG/5ML elixir 12.5-25 mg  12.5-25 mg Oral Q4H PRN Corky Mull, MD      . docusate sodium (COLACE) capsule 100 mg  100 mg Oral BID Corky Mull, MD   100 mg at 01/11/15 0851  . enoxaparin (LOVENOX) injection 40 mg  40 mg  Subcutaneous Q24H Corky Mull, MD   40 mg at 01/11/15 0845  . fentaNYL (SUBLIMAZE) injection 25 mcg  25 mcg Intravenous Q5 min PRN Gunnar Fusi, MD   25 mcg at 01/09/15 1820  . HYDROmorphone (DILAUDID) injection 0.5-1 mg  0.5-1 mg Intravenous Q2H PRN Corky Mull, MD   0.5 mg at 01/09/15 2256  . levothyroxine (SYNTHROID, LEVOTHROID) tablet 100 mcg  100 mcg Oral Maury Dus, MD   100 mcg at 01/11/15 0845  . magnesium hydroxide (MILK OF MAGNESIA) suspension 30 mL  30 mL Oral Daily PRN Corky Mull, MD   30 mL at 01/11/15 1237  . metoCLOPramide (REGLAN) tablet 5-10 mg  5-10 mg Oral Q8H PRN Corky Mull, MD       Or  . metoCLOPramide (REGLAN) injection 5-10 mg  5-10 mg Intravenous Q8H PRN Corky Mull, MD      . ondansetron [facility]) injection 4 mg  4 mg Intravenous Once PRN Gunnar Fusi, MD      . ondansetron [facility]) tablet 4 mg  4 mg Oral Q6H PRN Corky Mull, MD       Or  . ondansetron [facility]) injection 4 mg  4 mg Intravenous Q6H PRN Corky Mull, MD   4 mg at 01/11/15 0453  . oxyCODONE (Oxy IR/ROXICODONE) immediate release tablet 5-10 mg  5-10 mg Oral Q3H PRN Corky Mull, MD   5 mg at 01/11/15 0845  . pantoprazole (PROTONIX) EC tablet 40 mg  40 mg Oral BID Corky Mull, MD   40 mg at 01/11/15 0851  . polyethylene glycol (MIRALAX / GLYCOLAX) packet 17 g  17 g Oral Daily PRN Gladstone Lighter, MD      . simethicone (MYLICON) chewable tablet 80 mg  80 mg Oral QID PRN Gladstone Lighter, MD   80 mg at 01/11/15 1040  . sodium chloride 0.9 % injection 3 mL  3 mL Intravenous Q12H Gladstone Lighter, MD   3 mL at 01/10/15 2101  . sodium phosphate (FLEET) 7-19 GM/118ML enema 1 enema  1 enema Rectal Once PRN Corky Mull, MD      . tamsulosin [facility]) capsule 0.4 mg  0.4 mg Oral Daily Lattie Corns, PA-C   0.4 mg at 01/11/15 0850  . traMADol (ULTRAM) tablet 50-100 mg  50-100 mg Oral Q6H PRN Lattie Corns, PA-C         Assessment/Plan:  81 year old lady history of  hypothyroidism and osteoporosis, admitted with right hip fracture, status post mechanical fall. Patient underwent placement of intramedullary nail yesterday. Pain is controlled. Continue physical therapy. Out of bed to chair today. DVT prophylaxis as ordered.  Bed side commode, stool softener, Fleet enema and Miralax as needed. Low urine output: improved today, continue IV fluids and Flomax, foley catheter in place, continue monitoring I's and O's. Anemia secondary to intra-operative and post-op bleeding. Hemoglobin 8.2 today. Repeat hemoglobin and hematocrit in a.m. Plan is  to discharge to [facility] for rehabilitation.     LOS: 2 days   Jaquann Guarisco 01/11/2015, 12:45 PM

## 2015-01-11 NOTE — Clinical Documentation Improvement (Signed)
Internal Medicine  Please provide a diagnosis associated with what you documented - "Drop in hemoglobin from 12.4 to 9.1, repeat hemoglobin and hematocrit in a.m". Please update your documentation within the medical record to reflect your response to this query. Thank you   Other  Clinically Undetermined  Supporting Information:  This morning's H&H is: 8.2 / 24.5  Please exercise your independent, professional judgment when responding. A specific answer is not anticipated or expected.  Thank You, Zoila Shutter RN, [place] [phone]

## 2015-01-11 NOTE — Progress Notes (Signed)
Physical Therapy Treatment Patient Details Name: Christine [name] MRN: [id number] DOB: 04-04-1920 Today's Date: 01/11/2015    History of Present Illness 81 y.o. female with a known history of colon cancer in remission now, hypothyroidism, osteoporosis, hearing loss presents to the hospital after she had a mechanical fall and suffers from right hip fracture. Pt is s/p R ORIF repair of comminuted R intertrochanteric fx, WBAT.     PT Comments    Pt is consistently demonstrating progress with her ability to perform functional mobility with each treatment session and remains motivated during therapy. Pt is able to independently initiate sequencing of LEs and trunk for supine <>sit transfer but still requires mod assist for RLE and trunk. Pt able to maintain seated on EOB position with feet on the ground and no BUE support without displays of imbalance. Pt performed sit<>stand transfer with mod assist today; still requires vc for positioning of hands on bed for boost into standing. Once in standing pt performed stand pivot transfer with mod assist and was able to independently initiate steps bilaterally. Antalgic gait noted favoring the R side. Will attempt to ambulate within room during this afternoon's session to further progress mobility.   Follow Up Recommendations  SNF     Equipment Recommendations  Rolling walker with 5" wheels    Recommendations for Other Services       Precautions / Restrictions Precautions Precautions: Fall Restrictions Weight Bearing Restrictions: Yes RLE Weight Bearing: Weight bearing as tolerated    Mobility  Bed Mobility Overal bed mobility: Needs Assistance Bed Mobility: Supine to Sit     Supine to sit: Mod assist     General bed mobility comments: pt did better with independent sequencing of trunk and LEs toward EOB without vc. Pt still requires assistance for movement of RLE to control pain and to assist with trunk into upright posture.    Transfers Overall transfer level: Needs assistance Equipment used: Rolling walker (2 wheeled) Transfers: Sit to/from [facility] Sit to Stand: Mod assist Stand pivot transfers: Mod assist       General transfer comment: pt able to perform sit<>stand with only mod assist today; vc still required for hand placement on bed. Mod assist for maintanence of balance with stand pivot transfer; pt able to take steps without assistance for weight shifting. Antalgic gait noted.   Ambulation/Gait Ambulation/Gait assistance:  (steps taken with stand pivot transfer but true gait not initiatied. Will attempt this aftertoon)               Financial trader Rankin (Stroke Patients Only)       Balance Overall balance assessment: Needs assistance Sitting-balance support: No upper extremity supported;Feet supported Sitting balance-Leahy Scale: Good       Standing balance-Leahy Scale: Fair Standing balance comment: pt tolerated standing today; static stance and during performance of stand pivot transfer.                     Cognition Arousal/Alertness: Awake/alert Behavior During Therapy: WFL for tasks assessed/performed Overall Cognitive Status: Within Functional Limits for tasks assessed                      Exercises Total Joint Exercises Ankle Circles/Pumps: AROM;Both;Supine Quad Sets: Right;10 reps;Supine;Strengthening Gluteal Sets: Both;10 reps;Supine;Strengthening Towel Squeeze: Strengthening;Both;10 reps;Supine Heel Slides: AROM;Strengthening;Both;10 reps;Supine Hip ABduction/ADduction: AROM;Strengthening;Right;10 reps;Supine    General Comments  Pertinent Vitals/Pain Pain Assessment:  (No pain when stationary in bed) Pain Location: R hip pain with movement Pain Intervention(s): Limited activity within patient's tolerance;Monitored during session;Repositioned    Home Living                       Prior Function            PT Goals (current goals can now be found in the care plan section) Acute Rehab PT Goals PT Goal Formulation: With patient Time For Goal Achievement: 01/10/15 Potential to Achieve Goals: Fair Progress towards PT goals: Progressing toward goals    Frequency  BID    PT Plan Current plan remains appropriate    Co-evaluation             End of Session Equipment Utilized During Treatment: Gait belt Activity Tolerance: Patient limited by pain Patient left: with SCD's reapplied;with family/visitor present;with chair alarm set;in chair     Time: [id number] PT Time Calculation (min) (ACUTE ONLY): 25 min  Charges:                       G CodesMilon Score 01/11/2015, 11:09 AM

## 2015-01-11 NOTE — Care Management Important Message (Signed)
Important Message  Patient Details  Name: JEANNEAN PORTEOUS MRN: [id number] Date of Birth: December 24, 1920   Medicare Important Message Given:  Yes-second notification given    Darius Bump Allmond 01/11/2015, 12:36 PM

## 2015-01-11 NOTE — Progress Notes (Signed)
Plan is for patient to D/C to [facility] tomorrow 01/12/15. Kim admissions coordinator at [facility] is aware of above. Clinical Social Worker (CSW) will continue to follow and assist as needed.   Blima Rich, [place] [phone]

## 2015-01-11 NOTE — Progress Notes (Addendum)
  Subjective: 2 Days Post-Op Procedure(s) (LRB): INTRAMEDULLARY (IM) NAIL INTERTROCHANTRIC (Right) Patient reports pain as mild.   Patient is well, and has had no acute complaints or problems Plan is to go Skilled nursing facility after hospital stay.  [facility] Negative for chest pain and shortness of breath Fever: no Gastrointestinal:Negative for nausea and vomiting  Objective: Vital signs in last 24 hours: Temp:  [97.9 F (36.6 C)-100.2 F (37.9 C)] 98.2 F (36.8 C) (10/26 0404) Pulse Rate:  [73-83] 78 (10/26 0405) Resp:  [17-18] 18 (10/26 0404) BP: (112-149)/(45-52) 112/48 mmHg (10/26 0415) SpO2:  [92 %-98 %] 93 % (10/26 0404)  Intake/Output from previous day:  Intake/Output Summary (Last 24 hours) at 01/11/15 0742 Last data filed at 01/11/15 0415  Gross per 24 hour  Intake 616.25 ml  Output   1475 ml  Net -858.75 ml    Intake/Output this shift:    Labs:  Recent Labs  01/09/15 0913 01/10/15 0428 01/11/15 0356  HGB 12.4 9.1* 8.2*    Recent Labs  01/10/15 0428 01/11/15 0356  WBC 7.5 8.1  RBC 2.76* 2.53*  HCT 26.8* 24.5*  PLT 167 135*    Recent Labs  01/10/15 0428 01/11/15 0356  NA 140 135  K 4.4 4.4  CL 104 101  CO2 28 28  BUN 19 14  CREATININE 0.99 0.88  GLUCOSE 160* 111*  CALCIUM 7.9* 7.5*    Recent Labs  01/09/15 0913  INR 0.97     EXAM General - Patient is Alert, Appropriate and Oriented Extremity - Neurologically intact ABD soft Dorsiflexion/Plantar flexion intact Incision: dressing C/D/I No cellulitis present Dressing/Incision - clean, dry, no drainage Motor Function - intact, moving foot and toes well on exam.   Bulky dressing removed today.  Honeycomb and 4x4 applied.  Past Medical History  Diagnosis Date  . Hearing loss   . Wears glasses   . Cancer ([place])     colon  . Carpal tunnel syndrome   . Hypothyroidism   . Osteoporosis   . DJD (degenerative joint disease)     Assessment/Plan: 2 Days Post-Op  Procedure(s) (LRB): INTRAMEDULLARY (IM) NAIL INTERTROCHANTRIC (Right) Active Problems:   Hip fracture (HCC)  Estimated body mass index is 19.97 kg/(m^2) as calculated from the following:   Height as of this encounter: 5\' 5"  (1.651 m).   Weight as of this encounter: 54.432 kg (120 lb). Advance diet Up with therapy   Bulky dressing changed today.  Honeycomb and 4x4 applied. Hg 8.2 this AM.  Pt is asymptomatic.  Continue to monitor.  Repeat CBC tomorrow morning. Pt continues to have urinary retention.  Foley in place, started on Flomax. Pt has not had a BM.  Suppository and FLEET enema added as needed. Care management to assist with disharge. Family concerned about diastolic pressure.  Instructed the family to speak with internal medicine about this matter.  DVT Prophylaxis - Lovenox, Foot Pumps and TED hose Weight-Bearing as tolerated to right leg  J. Cameron Proud, PA-C [facility] Orthopaedic Surgery 01/11/2015, 7:42 AM

## 2015-01-12 ENCOUNTER — Encounter
Admission: RE | Admit: 2015-01-12 | Discharge: 2015-01-12 | Disposition: A | Discharge status: Home / Self Care | Payer: Commercial Managed Care - HMO | Source: Intra-hospital | Attending: Internal Medicine | Admitting: Internal Medicine

## 2015-01-12 LAB — BASIC METABOLIC PANEL
ANION GAP: 4 — AB (ref 5–15)
BUN: 13 mg/dL (ref 6–20)
CALCIUM: 8.1 mg/dL — AB (ref 8.9–10.3)
CHLORIDE: 104 mmol/L (ref 101–111)
CO2: 28 mmol/L (ref 22–32)
CREATININE: 0.84 mg/dL (ref 0.44–1.00)
GFR calc non Af Amer: 59 mL/min — ABNORMAL LOW (ref >60)
Glucose, Bld: 125 mg/dL — ABNORMAL HIGH (ref 65–99)
Potassium: 5.1 mmol/L (ref 3.5–5.1)
SODIUM: 136 mmol/L (ref 135–145)

## 2015-01-12 LAB — CBC
HCT: 23.5 % — ABNORMAL LOW (ref 35.0–47.0)
HEMOGLOBIN: 7.9 g/dL — AB (ref 12.0–16.0)
MCH: 32.5 pg (ref 26.0–34.0)
MCHC: 33.5 g/dL (ref 32.0–36.0)
MCV: 97.1 fL (ref 80.0–100.0)
PLATELETS: 144 10*3/uL — AB (ref 150–440)
RBC: 2.42 MIL/uL — ABNORMAL LOW (ref 3.80–5.20)
RDW: 13.1 % (ref 11.5–14.5)
WBC: 9 10*3/uL (ref 3.6–11.0)

## 2015-01-12 LAB — PREPARE RBC (CROSSMATCH)

## 2015-01-12 MED ORDER — ENOXAPARIN SODIUM 40 MG/0.4ML ~~LOC~~ SOLN: 40.0000 mg | SUBCUTANEOUS | Status: DC

## 2015-01-12 MED ORDER — SODIUM CHLORIDE 0.9 % IV SOLN
Freq: Once | INTRAVENOUS | Status: AC
  Administered 2015-01-12: 16:00:00 via INTRAVENOUS

## 2015-01-12 MED ORDER — FUROSEMIDE 10 MG/ML IJ SOLN
20.0000 mg | Freq: Once | INTRAMUSCULAR | Status: AC
  Administered 2015-01-12: 20 mg via INTRAVENOUS
  Filled 2015-01-12: qty 2

## 2015-01-12 NOTE — Plan of Care (Signed)
Problem: Phase II Progression Outcomes Goal: Tolerating diet Outcome: Completed/Met Date Met:  01/12/15 No complaints of nausea this shift. Goal: Bed to chair Outcome: Progressing Able to ambulate from bed to bedside commode.  Problem: Phase III Progression Outcomes Goal: Pain controlled on oral analgesia Outcome: Completed/Met Date Met:  01/12/15 Pain control with oral pain medications. Goal: Discharge plan remains appropriate-arrangements made Outcome: Completed/Met Date Met:  01/12/15 Plans to be discharged to [facility].

## 2015-01-12 NOTE — Progress Notes (Signed)
Per MD patient is not medically stable for D/C today due to low hg and will receive a transfusion today. Plan is for patient to D/C to [facility] tomorrow 01/13/15. Patient's daughter [name] [phone] is going to [facility] today to complete admissions paper work with Maudie Mercury and look at a private and semi-private room. [facility] authorization has been received. Auth # [id number]. Kim admissions coordinator at [facility] is aware of above. Clinical Social Worker (CSW) will continue to follow and assist as needed.   Blima Rich, [place] [phone]

## 2015-01-12 NOTE — Progress Notes (Signed)
Physical Therapy Treatment Patient Details Name: Christine [name] MRN: [id number] DOB: September 15, 1920 Today's Date: 01/12/2015    History of Present Illness 81 y.o. female with a known history of colon cancer in remission now, hypothyroidism, osteoporosis, hearing loss presents to the hospital after she had a mechanical fall and suffers from right hip fracture. Pt is s/p R ORIF repair of comminuted R intertrochanteric fx, WBAT.     PT Comments    Upon arrival to room, pt had just been helped back to bed by CNA after sitting up in recliner since the end of the morning treatment session. Pt states that she is exhausted and did not feel like doing any mobility training but did agree to doing bed level therapeutic exercise. Pt was able to complete sets of therapeutic exercises for all major muscle groups in BLEs in order to increase strength and endurance for functional mobility.   Follow Up Recommendations  SNF     Equipment Recommendations  Rolling walker with 5" wheels    Recommendations for Other Services       Precautions / Restrictions Precautions Precautions: Fall Restrictions Weight Bearing Restrictions: Yes RLE Weight Bearing: Weight bearing as tolerated    Mobility  Bed Mobility Overal bed mobility:  (no bed mobility performed this afternoon) Bed Mobility: Supine to Sit     Supine to sit: Mod assist     General bed mobility comments: pt remains mod assist and demonstrates good sequencing of LEs toward EOB. Still requires assist with RLE and trunk.   Transfers Overall transfer level:  (no transfers performed) Equipment used: Rolling walker (2 wheeled) Transfers: Sit to/from Stand Sit to Stand: Min assist         General transfer comment: pt able to perform sit <>stand with less assistance from PT. Still requires vc for hand placement.   Ambulation/Gait Ambulation/Gait assistance:  (none performed) Ambulation Distance (Feet): 25 Feet Assistive device: Rolling  walker (2 wheeled) Gait Pattern/deviations: Step-through pattern;Decreased stance time - right;Decreased step length - left;Decreased stride length   Gait velocity interpretation: <1.8 ft/sec, indicative of risk for recurrent falls     Stairs            Wheelchair Mobility    Modified Rankin (Stroke Patients Only)       Balance Overall balance assessment: No apparent balance deficits (not formally assessed) Sitting-balance support: No upper extremity supported;Feet supported Sitting balance-Leahy Scale: Good       Standing balance-Leahy Scale: Fair                      Cognition Arousal/Alertness: Awake/alert Behavior During Therapy: WFL for tasks assessed/performed Overall Cognitive Status: Within Functional Limits for tasks assessed                      Exercises Total Joint Exercises Ankle Circles/Pumps: AROM;Both;20 reps;Supine Quad Sets: Both;15 reps;Supine;Strengthening Gluteal Sets: Strengthening;Both;15 reps;Supine Towel Squeeze: Strengthening;Both;15 reps;Supine Short Arc Quad: AROM;Strengthening;Right;15 reps;Supine Heel Slides: AAROM;Strengthening;Right;15 reps;Supine Hip ABduction/ADduction: AAROM;Right;15 reps;Supine Straight Leg Raises: AAROM;Strengthening;Right;15 reps;Supine    General Comments        Pertinent Vitals/Pain Pain Assessment: No/denies pain Pain Location: R hip pain with movement Pain Descriptors / Indicators: Aching Pain Intervention(s): Monitored during session;Premedicated before session;Repositioned    Home Living                      Prior Function  PT Goals (current goals can now be found in the care plan section) Acute Rehab PT Goals Patient Stated Goal: to get back to prior level of activity PT Goal Formulation: With patient Time For Goal Achievement: 01/10/15 Potential to Achieve Goals: Fair Progress towards PT goals: Progressing toward goals    Frequency  BID    PT  Plan Current plan remains appropriate    Co-evaluation             End of Session   Activity Tolerance: Patient tolerated treatment well Patient left: with call bell/phone within reach;in bed;with bed alarm set;with family/visitor present;with SCD's reapplied     Time: [id number] PT Time Calculation (min) (ACUTE ONLY): 15 min  Charges:  $Gait Training: 8-22 mins                    G Codes:      Milon Score 01/12/2015, 1:34 PM

## 2015-01-12 NOTE — Progress Notes (Signed)
  Subjective: 3 Days Post-Op Procedure(s) (LRB): INTRAMEDULLARY (IM) NAIL INTERTROCHANTRIC (Right) Patient reports pain as mild.   Patient is well, and has had no acute complaints or problems Plan is to go Skilled nursing facility after hospital stay. Negative for chest pain and shortness of breath Fever: no Gastrointestinal:negative for nausea and vomiting  Objective: Vital signs in last 24 hours: Temp:  [97.6 F (36.4 C)-98.6 F (37 C)] 98.3 F (36.8 C) (10/27 0333) Pulse Rate:  [73-109] 91 (10/27 0333) Resp:  [14-20] 20 (10/27 0333) BP: (131-164)/(39-65) 137/50 mmHg (10/27 0333) SpO2:  [93 %-99 %] 94 % (10/27 0333)  Intake/Output from previous day:  Intake/Output Summary (Last 24 hours) at 01/12/15 0747 Last data filed at 01/12/15 0545  Gross per 24 hour  Intake 2661.25 ml  Output   2250 ml  Net 411.25 ml    Intake/Output this shift:    Labs:  Recent Labs  01/09/15 0913 01/10/15 0428 01/11/15 0356 01/12/15 0356  HGB 12.4 9.1* 8.2* 7.9*    Recent Labs  01/11/15 0356 01/12/15 0356  WBC 8.1 9.0  RBC 2.53* 2.42*  HCT 24.5* 23.5*  PLT 135* 144*    Recent Labs  01/11/15 0356 01/12/15 0356  NA 135 136  K 4.4 5.1  CL 101 104  CO2 28 28  BUN 14 13  CREATININE 0.88 0.84  GLUCOSE 111* 125*  CALCIUM 7.5* 8.1*    Recent Labs  01/09/15 0913  INR 0.97     EXAM General - Patient is Alert, Appropriate and Oriented Extremity - Neurologically intact ABD soft Intact pulses distally Dorsiflexion/Plantar flexion intact Incision: moderate drainage No cellulitis present Dressing/Incision - blood tinged drainage Motor Function - intact, moving foot and toes well on exam.   Abdomen soft on exam.  Honeycomb dressing changed.  Past Medical History  Diagnosis Date  . Hearing loss   . Wears glasses   . Cancer ([place])     colon  . Carpal tunnel syndrome   . Hypothyroidism   . Osteoporosis   . DJD (degenerative joint disease)      Assessment/Plan: 3 Days Post-Op Procedure(s) (LRB): INTRAMEDULLARY (IM) NAIL INTERTROCHANTRIC (Right) Active Problems:   Hip fracture (HCC)  Estimated body mass index is 19.97 kg/(m^2) as calculated from the following:   Height as of this encounter: 5\' 5"  (1.651 m).   Weight as of this encounter: 54.432 kg (120 lb). Advance diet Up with therapy   Pt hg dropped down to 7.9, was 9.1 at admission.  Will discuss with internal med if want to transfuse.  Pt is asymptomatic.  Dressing changed this AM.  Moderate bloody drainage present. Pt still has foley cath.  Continue Flomax, will try to d/c today. Pt has been on IVF for urinary retention. Pt has had a BM.  Ambulated 12 feet yesterday with the assistance of a walker with PT. Orthopaedically stable, pending discharge from internal medicine.  Staples can be removed in 10-14 days. F/U in the office in 6 weeks with Cameron Proud, PA-C or Dr. Roland Rack  DVT Prophylaxis - Lovenox, Foot Pumps and TED hose Weight-Bearing as tolerated to right leg  J. Cameron Proud, PA-C [facility] Orthopaedic Surgery 01/12/2015, 7:47 AM

## 2015-01-12 NOTE — Progress Notes (Signed)
Rt hip is edematous, bruised outer hip to inner thigh and down thigh of rt leg no weeping, ice pack applied

## 2015-01-12 NOTE — Progress Notes (Signed)
Subjective: Patient feels better today. She had a small bowel movement. Ambulated with PT. No abdominal pain.  Mild right hip discomfort, aggravated by right hip movements. Blood soaked dressing at surgical site reported by nurse. No complaint of shortness of breath or chest pain.  Family member and nurse at bedside.   Objective: Vital signs in last 24 hours: Temp:  [97.6 F (36.4 C)-98.6 F (37 C)] 98.3 F (36.8 C) (10/27 0752) Pulse Rate:  [89-109] 94 (10/27 0752) Resp:  [18-20] 20 (10/27 0333) BP: (133-168)/(48-65) 168/49 mmHg (10/27 0752) SpO2:  [94 %-99 %] 98 % (10/27 0752) Weight change:  Last BM Date: 01/12/15  Intake/Output from previous day: 10/26 0701 - 10/27 0700 In: 2661.3 [P.O.:240; I.V.:2421.3] Out: 2250 [Urine:2250] Intake/Output this shift: Total I/O In: -  Out: 500 [Urine:500]  GENERAL:alert, oriented x 3, no acute distress.  EYES: no pallor, no icterus, extraocular muscles are intact.  HEENT: Head atraumatic, normocephalic. Oropharynx and nasopharynx clear.  NECK: supple, no jugular venous distention. No thyroid enlargement, no tenderness.  LUNGS: clear to auscultation. No wheezing, rhonchi or crepitation. No use of accessory muscles of respiration.  CARDIOVASCULAR: regular rate and rhythm, S1, S2 normal. No tachycardia.  ABDOMEN: Soft, non-tender, normal bowel sounds, no organomegaly or mass.  EXTREMITIES: Right hip dressing is intact. No pedal edema, cyanosis, or clubbing.  NEUROLOGIC: normal strength and tone in bilateral upper and lower extremities, sensations are grossly intact, normal speech. SKIN: No obvious rash, lesion, or ulcer.   Lab Results:  Recent Labs  01/11/15 0356 01/12/15 0356  WBC 8.1 9.0  HGB 8.2* 7.9*  HCT 24.5* 23.5*  PLT 135* 144*   BMET  Recent Labs  01/11/15 0356 01/12/15 0356  NA 135 136  K 4.4 5.1  CL 101 104  CO2 28 28  GLUCOSE 111* 125*  BUN 14 13  CREATININE 0.88 0.84  CALCIUM 7.5* 8.1*     Studies/Results: No results found.  Medications:  Scheduled Meds: . sodium chloride   Intravenous Once  . calcium-vitamin D  1 tablet Oral BID  . cholecalciferol  1,000 Units Oral QPM  . docusate sodium  100 mg Oral BID  . enoxaparin (LOVENOX) injection  40 mg Subcutaneous Q24H  . furosemide  20 mg Intravenous Once  . levothyroxine  100 mcg Oral BH-q7a  . pantoprazole  40 mg Oral BID  . sodium chloride  3 mL Intravenous Q12H  . tamsulosin  0.4 mg Oral Daily   Continuous Infusions: . dextrose 5 % and 0.9 % NaCl with KCl 20 mEq/L 75 mL/hr at 01/11/15 2232   PRN Meds:.acetaminophen **OR** acetaminophen, ALPRAZolam, bisacodyl, diphenhydrAMINE, fentaNYL (SUBLIMAZE) injection, HYDROmorphone (DILAUDID) injection, magnesium hydroxide, metoCLOPramide **OR** metoCLOPramide (REGLAN) injection, ondansetron (ZOFRAN) IV, ondansetron **OR** ondansetron (ZOFRAN) IV, oxyCODONE, polyethylene glycol, simethicone, sodium phosphate, traMADol   Assessment/Plan:  81 year old lady history of hypothyroidism and osteoporosis, admitted with right hip fracture, status post mechanical fall. Patient underwent placement of intramedullary nail. Pain is controlled. Continue physical therapy. Out of bed, ambulate as tolerated. DVT prophylaxis as ordered.  Anemia secondary to intra-operative and post-op bleeding. Hemoglobin dropped to 7.9 today. Transfuse one unit of PRBC. Obtain post -transfusion H/H. Repeat CBC in a.m. She had a bowel movement today, continue stool softener, use Fleet enema and Miralax as needed. Good urine output: discontinue Foley catheter, continue Flomax, continue monitoring I's and O's. Prepare for possible discharge to [facility] tomorrow for rehabilitation.     LOS: 3 days   Kenya Shiraishi 01/12/2015, 12:50 PM

## 2015-01-12 NOTE — Progress Notes (Signed)
Physical Therapy Treatment Patient Details Name: Christine [name] MRN: [id number] DOB: January 12, 1921 Today's Date: 01/12/2015    History of Present Illness 81 y.o. female with a known history of colon cancer in remission now, hypothyroidism, osteoporosis, hearing loss presents to the hospital after she had a mechanical fall and suffers from right hip fracture. Pt is s/p R ORIF repair of comminuted R intertrochanteric fx, WBAT.     PT Comments    Pt continues to demonstrate increased ability to perform functional mobility. Pt demonstrated better fluidity of transfer from supine <>sit but still requires mod assist for trunk and RLE control. Pt improved sit<>stand transfer from mod assist to min assist today; demonstrating increased functional LE strength. Pt ambulated x25 ft w/ RW and mod assist. Assistance required for steering the walker around obstacles. Pt did a good job today increasing her step lengths but still takes a short step with the L due to pain on R with stance. Pt will benefit from further skilled acute PT services in order to increase functional mobility/independence.   Follow Up Recommendations  SNF     Equipment Recommendations  Rolling walker with 5" wheels    Recommendations for Other Services       Precautions / Restrictions Precautions Precautions: Fall Restrictions Weight Bearing Restrictions: Yes RLE Weight Bearing: Weight bearing as tolerated    Mobility  Bed Mobility Overal bed mobility: Needs Assistance Bed Mobility: Supine to Sit     Supine to sit: Mod assist     General bed mobility comments: pt remains mod assist and demonstrates good sequencing of LEs toward EOB. Still requires assist with RLE and trunk.   Transfers Overall transfer level: Needs assistance Equipment used: Rolling walker (2 wheeled) Transfers: Sit to/from Stand Sit to Stand: Min assist         General transfer comment: pt able to perform sit <>stand with less assistance from  PT. Still requires vc for hand placement.   Ambulation/Gait Ambulation/Gait assistance: Mod assist Ambulation Distance (Feet): 25 Feet Assistive device: Rolling walker (2 wheeled) Gait Pattern/deviations: Step-through pattern;Decreased stance time - right;Decreased step length - left;Decreased stride length   Gait velocity interpretation: <1.8 ft/sec, indicative of risk for recurrent falls     Stairs            Wheelchair Mobility    Modified Rankin (Stroke Patients Only)       Balance Overall balance assessment: Needs assistance Sitting-balance support: No upper extremity supported;Feet supported Sitting balance-Leahy Scale: Good       Standing balance-Leahy Scale: Fair                      Cognition Arousal/Alertness: Awake/alert Behavior During Therapy: WFL for tasks assessed/performed Overall Cognitive Status: Within Functional Limits for tasks assessed                      Exercises Total Joint Exercises Ankle Circles/Pumps: AROM;Both;Supine;15 reps Quad Sets: Strengthening;Right;10 reps;Supine Gluteal Sets: Strengthening;Both;10 reps;Supine Towel Squeeze: Strengthening;Both;10 reps;Supine Heel Slides: AROM;Strengthening;Right;10 reps;Supine Hip ABduction/ADduction: AAROM;10 reps;Supine;Right    General Comments        Pertinent Vitals/Pain Pain Location: R hip paim with movement Pain Descriptors / Indicators: Aching Pain Intervention(s): Monitored during session;Premedicated before session;Repositioned    Home Living                      Prior Function  PT Goals (current goals can now be found in the care plan section) Acute Rehab PT Goals Patient Stated Goal: to get back to prior level of activity PT Goal Formulation: With patient Time For Goal Achievement: 01/10/15 Potential to Achieve Goals: Fair Progress towards PT goals: Progressing toward goals    Frequency  BID    PT Plan Current plan  remains appropriate    Co-evaluation             End of Session   Activity Tolerance: Patient tolerated treatment well Patient left: in chair;with chair alarm set;with call bell/phone within reach     Time: [id number] PT Time Calculation (min) (ACUTE ONLY): 17 min  Charges:                       G CodesMilon Score 01/12/2015, 11:06 AM

## 2015-01-13 DIAGNOSIS — R2689 Other abnormalities of gait and mobility: Secondary | ICD-10-CM | POA: Diagnosis not present

## 2015-01-13 DIAGNOSIS — E039 Hypothyroidism, unspecified: Secondary | ICD-10-CM | POA: Diagnosis not present

## 2015-01-13 DIAGNOSIS — Z9181 History of falling: Secondary | ICD-10-CM | POA: Diagnosis not present

## 2015-01-13 DIAGNOSIS — S72141A Displaced intertrochanteric fracture of right femur, initial encounter for closed fracture: Secondary | ICD-10-CM | POA: Diagnosis not present

## 2015-01-13 DIAGNOSIS — D649 Anemia, unspecified: Secondary | ICD-10-CM | POA: Diagnosis not present

## 2015-01-13 DIAGNOSIS — M810 Age-related osteoporosis without current pathological fracture: Secondary | ICD-10-CM | POA: Diagnosis not present

## 2015-01-13 DIAGNOSIS — R69 Illness, unspecified: Secondary | ICD-10-CM | POA: Diagnosis not present

## 2015-01-13 DIAGNOSIS — M6281 Muscle weakness (generalized): Secondary | ICD-10-CM | POA: Diagnosis not present

## 2015-01-13 DIAGNOSIS — M80051D Age-related osteoporosis with current pathological fracture, right femur, subsequent encounter for fracture with routine healing: Secondary | ICD-10-CM | POA: Diagnosis not present

## 2015-01-13 DIAGNOSIS — S72009A Fracture of unspecified part of neck of unspecified femur, initial encounter for closed fracture: Secondary | ICD-10-CM | POA: Diagnosis not present

## 2015-01-13 DIAGNOSIS — R531 Weakness: Secondary | ICD-10-CM | POA: Diagnosis not present

## 2015-01-13 LAB — TYPE AND SCREEN
ABO/RH(D): A POS
Antibody Screen: NEGATIVE
Unit division: 0

## 2015-01-13 LAB — CBC
HEMATOCRIT: 29.3 % — AB (ref 35.0–47.0)
Hemoglobin: 9.9 g/dL — ABNORMAL LOW (ref 12.0–16.0)
MCH: 31.9 pg (ref 26.0–34.0)
MCHC: 33.9 g/dL (ref 32.0–36.0)
MCV: 94 fL (ref 80.0–100.0)
PLATELETS: 174 10*3/uL (ref 150–440)
RBC: 3.12 MIL/uL — AB (ref 3.80–5.20)
RDW: 15.1 % — ABNORMAL HIGH (ref 11.5–14.5)
WBC: 8.2 10*3/uL (ref 3.6–11.0)

## 2015-01-13 MED ORDER — DOCUSATE SODIUM 100 MG PO CAPS: 100.0000 mg | ORAL_CAPSULE | Freq: Two times a day (BID) | ORAL | Status: DC

## 2015-01-13 MED ORDER — ACETAMINOPHEN 325 MG PO TABS: 650.0000 mg | ORAL_TABLET | Freq: Four times a day (QID) | ORAL | Status: DC | PRN

## 2015-01-13 MED ORDER — PANTOPRAZOLE SODIUM 40 MG PO TBEC: 40.0000 mg | DELAYED_RELEASE_TABLET | Freq: Two times a day (BID) | ORAL | Status: DC

## 2015-01-13 MED ORDER — TRAMADOL HCL 50 MG PO TABS: 50.0000 mg | ORAL_TABLET | Freq: Four times a day (QID) | ORAL | Status: DC | PRN

## 2015-01-13 MED ORDER — ALPRAZOLAM 0.25 MG PO TABS: 0.2500 mg | ORAL_TABLET | Freq: Two times a day (BID) | ORAL | Status: DC | PRN

## 2015-01-13 MED ORDER — BISACODYL 10 MG RE SUPP: 10.0000 mg | Freq: Every day | RECTAL | Status: DC | PRN

## 2015-01-13 MED ORDER — POLYETHYLENE GLYCOL 3350 17 G PO PACK: 17.0000 g | PACK | Freq: Every day | ORAL | Status: DC | PRN

## 2015-01-13 MED ORDER — DIPHENHYDRAMINE HCL 12.5 MG/5ML PO ELIX: 12.5000 mg | ORAL_SOLUTION | ORAL | Status: DC | PRN

## 2015-01-13 MED ORDER — SIMETHICONE 80 MG PO CHEW: 80.0000 mg | CHEWABLE_TABLET | Freq: Four times a day (QID) | ORAL | Status: DC | PRN

## 2015-01-13 MED ORDER — OXYCODONE HCL 5 MG PO TABS: 5.0000 mg | ORAL_TABLET | ORAL | Status: DC | PRN

## 2015-01-13 MED ORDER — ONDANSETRON HCL 4 MG PO TABS: 4.0000 mg | ORAL_TABLET | Freq: Four times a day (QID) | ORAL | Status: DC | PRN

## 2015-01-13 NOTE — Progress Notes (Signed)
Discharge note: Pt. discharging to [facility] for rehab. Dressing to Right hip changed. PRN oxycodone given for pain just prior to D/C. D/C packet to be sent with patient. Transport by EMS. Family will accompany pt. with belongings.

## 2015-01-13 NOTE — Discharge Summary (Signed)
Physician Discharge Summary  Patient ID: Christine [name] MRN: [id number] DOB/AGE: 08/03/1920 81 y.o.  Admit date: 01/09/2015 Discharge date: 01/13/2015  Admission Diagnoses: Right hip inter trochanteric comminuted fracture  Discharge Diagnoses:  Right hip fracture, status post placement of intramedullary nail. Active Problems:   Hip fracture (HCC) Chronic medical conditions:  Hypothyroidism and osteoporosis.  Discharged Condition: stable  Hospital Course: 81 year old lady history of hypothyroidism and osteoporosis, admitted with right hip fracture, status post mechanical fall. Patient underwent placement of intramedullary nail. Pain was controlled. She received physical therapy.  Patient was noted to be anemic secondary to intra-operative bleeding and postoperative oozing from the surgical site. Hemoglobin dropped to 7.9 yesterday.  Patient received 1 unit of packed RBCs.  Posttransfusion hemoglobin is 9.9 today.  Patient ambulated in the hallway with physical therapy today.  Shall discharge to [facility] today for rehabilitation. Patient will follow up with me and Orthopedics as outpatient.  Discharge Exam: Blood pressure 167/70, pulse 78, temperature 98.3 F (36.8 C), temperature source Oral, resp. rate 18, height 5\' 5"  (1.651 m), weight 54.432 kg (120 lb), SpO2 96 %. GENERAL:alert, oriented x 3, no acute distress.  EYES: no pallor, no icterus, extraocular muscles are intact.  HEENT: Head atraumatic, normocephalic. Oropharynx and nasopharynx clear.  NECK: supple, no jugular venous distention. No thyroid enlargement, no tenderness.  LUNGS: clear to auscultation. No wheezing, rhonchi or crepitation. No use of accessory muscles of respiration.  CARDIOVASCULAR: regular rate and rhythm, S1, S2 normal. No tachycardia.  ABDOMEN: Soft, non-tender, normal bowel sounds, no organomegaly or mass.  EXTREMITIES: Right hip dressing is intact. No pedal edema, cyanosis, or clubbing.  NEUROLOGIC:  normal strength and tone in bilateral upper and lower extremities, sensations are grossly intact, normal speech. SKIN: No obvious rash, lesion, or ulcer.   Disposition: [facility] for rehabilitation     Medication List    TAKE these medications        acetaminophen 325 MG tablet  Commonly known as:  TYLENOL  Take 2 tablets (650 mg total) by mouth every 6 (six) hours as needed for mild pain (or Fever >/= 101).     ALPRAZolam 0.25 MG tablet  Commonly known as:  XANAX  Take 1 tablet (0.25 mg total) by mouth 2 (two) times daily as needed for anxiety.     aspirin EC 81 MG tablet  Take 81 mg by mouth daily as needed for mild pain or moderate pain.     bisacodyl 10 MG suppository  Commonly known as:  DULCOLAX  Place 1 suppository (10 mg total) rectally daily as needed for moderate constipation.     CALCIUM 600 + D 600-200 MG-UNIT Tabs  Generic drug:  Calcium Carb-Cholecalciferol  Take 1 tablet by mouth 2 (two) times daily. With lunch and dinner     diphenhydrAMINE 12.5 MG/5ML elixir  Commonly known as:  BENADRYL  Take 5-10 mLs (12.5-25 mg total) by mouth every 4 (four) hours as needed for itching.     docusate sodium 100 MG capsule  Commonly known as:  COLACE  Take 1 capsule (100 mg total) by mouth 2 (two) times daily.     enoxaparin 40 MG/0.4ML injection  Commonly known as:  LOVENOX  Inject 0.4 mLs (40 mg total) into the skin daily.     levothyroxine 100 MCG tablet  Commonly known as:  SYNTHROID, LEVOTHROID  Take 100 mcg by mouth daily.     ondansetron 4 MG tablet  Commonly known as:  ZOFRAN  Take  1 tablet (4 mg total) by mouth every 6 (six) hours as needed for nausea.     oxyCODONE 5 MG immediate release tablet  Commonly known as:  Oxy IR/ROXICODONE  Take 1-2 tablets (5-10 mg total) by mouth every 3 (three) hours as needed for breakthrough pain.     pantoprazole 40 MG tablet  Commonly known as:  PROTONIX  Take 1 tablet (40 mg total) by mouth 2 (two) times daily.      polyethylene glycol packet  Commonly known as:  MIRALAX / GLYCOLAX  Take 17 g by mouth daily as needed for moderate constipation.     simethicone 80 MG chewable tablet  Commonly known as:  MYLICON  Chew 1 tablet (80 mg total) by mouth 4 (four) times daily as needed for flatulence.     traMADol 50 MG tablet  Commonly known as:  ULTRAM  Take 1 tablet (50 mg total) by mouth every 6 (six) hours as needed for moderate pain or severe pain.     Vitamin D 1000 UNITS capsule  Take 1,000 Units by mouth every evening.           Follow-up Information    Follow up with Corky Mull, MD In 4 weeks.   Specialty:  Surgery   Contact information:   [place] [address] [phone]       Signed: Glendon Axe 01/13/2015, 1:01 PM

## 2015-01-13 NOTE — Progress Notes (Signed)
Proceeded to give patient am dose of synthroid and patient and her daughter stated that she had already been given her synthroid.  Called night shift nurse because medication was not scanned as completed and found that medication was already given.  Did not proceed with giving medication.

## 2015-01-13 NOTE — Care Management Important Message (Signed)
Important Message  Patient Details  Name: Christine [name] MRN: [id number] Date of Birth: [birth year]   Medicare Important Message Given:  Yes-third notification given    [name] A Pastor Sgro 01/13/2015, 10:45 AM

## 2015-01-13 NOTE — Progress Notes (Signed)
  Subjective: 4 Days Post-Op Procedure(s) (LRB): INTRAMEDULLARY (IM) NAIL INTERTROCHANTRIC (Right) Patient reports pain as moderate.   Patient is well, and has had no acute complaints or problems Plan is to go Skilled nursing facility after hospital stay. Negative for chest pain and shortness of breath Fever: no Gastrointestinal:Negative for nausea and vomiting  Objective: Vital signs in last 24 hours: Temp:  [98.2 F (36.8 C)-98.9 F (37.2 C)] 98.3 F (36.8 C) (10/28 0610) Pulse Rate:  [87-102] 93 (10/28 0610) Resp:  [16-18] 18 (10/28 0610) BP: (147-169)/(48-65) 165/65 mmHg (10/28 0610) SpO2:  [95 %-99 %] 95 % (10/28 0610)  Intake/Output from previous day:  Intake/Output Summary (Last 24 hours) at 01/13/15 0744 Last data filed at 01/12/15 2113  Gross per 24 hour  Intake    688 ml  Output    500 ml  Net    188 ml    Intake/Output this shift:    Labs:  Recent Labs  01/11/15 0356 01/12/15 0356  HGB 8.2* 7.9*    Recent Labs  01/11/15 0356 01/12/15 0356  WBC 8.1 9.0  RBC 2.53* 2.42*  HCT 24.5* 23.5*  PLT 135* 144*    Recent Labs  01/11/15 0356 01/12/15 0356  NA 135 136  K 4.4 5.1  CL 101 104  CO2 28 28  BUN 14 13  CREATININE 0.88 0.84  GLUCOSE 111* 125*  CALCIUM 7.5* 8.1*   No results for input(s): LABPT, INR in the last 72 hours.   EXAM General - Patient is Alert, Appropriate and Oriented Extremity - Neurologically intact ABD soft Sensation intact distally Intact pulses distally Dorsiflexion/Plantar flexion intact Incision: moderate drainage Dressing/Incision - blood tinged drainage Motor Function - intact, moving foot and toes well on exam.   Extensive ecchymosis present at the site of the right hip incision.  Dressing changed yesterday. No hematoma is palpated.  No weeping is noted.   Abdomen is soft without tympany.  Normal BS.  Past Medical History  Diagnosis Date  . Hearing loss   . Wears glasses   . Cancer ([place])     colon  .  Carpal tunnel syndrome   . Hypothyroidism   . Osteoporosis   . DJD (degenerative joint disease)     Assessment/Plan: 4 Days Post-Op Procedure(s) (LRB): INTRAMEDULLARY (IM) NAIL INTERTROCHANTRIC (Right) Active Problems:   Hip fracture (HCC)  Estimated body mass index is 19.97 kg/(m^2) as calculated from the following:   Height as of this encounter: 5\' 5"  (1.651 m).   Weight as of this encounter: 54.432 kg (120 lb). Advance diet Up with therapy   Extensive ecchymosis at the site of the right hip incision.  No fevers or increase WBC. Pt's Foley has been removed, continue Flomax. Pt has had a BM since being here in the hospital. Hg 7.9 yesterday morning, pt received 1 unit transfusion yesterday.  Stat CBC ordered for Hg re-check. Labs reviewed. Pt able to ambulate 25 feet with a walker yesterday with PT. Discharge pending clearance by internal medicine.  Staples can be removed in 10-14 days. F/U in the office in 6 weeks with Cameron Proud, PA-C or Dr. Roland Rack  DVT Prophylaxis - Lovenox, Foot Pumps and TED hose Weight-Bearing as tolerated to right leg  J. Cameron Proud, PA-C [facility] Orthopaedic Surgery 01/13/2015, 7:44 AM

## 2015-01-13 NOTE — Discharge Instructions (Signed)

## 2015-01-13 NOTE — Progress Notes (Signed)
Physical Therapy Treatment Patient Details Name: Christine [name] MRN: [id number] DOB: November 11, 1920 Today's Date: 01/13/2015    History of Present Illness 81 y.o. female with a known history of colon cancer in remission now, hypothyroidism, osteoporosis, hearing loss presents to the hospital after she had a mechanical fall and suffers from right hip fracture. Pt is s/p R ORIF repair of comminuted R intertrochanteric fx, WBAT.     PT Comments    Pt appears to be in much better spirits today and states that she is in less pain than she has been in previous days. Pt able to perform sit<>stand transfer with min assist, vc for hand placement still required. Standing balance training performed while simultaneously donning diaper; pt able to maintain static standing balance without BUE support on RW. SLS also performed with BUE support on RW while donning socks; SLS performed bilat; RLE SLS noted to be slightly painful. Pt ambulated x125 ft w/ RW and min assist. Pt demonstrated greater bilat step length today and better independent steering of RW for obstacle avoidance. No instances of imbalance noted with gait. Minimal/no rest breaks taken.  Pt has made great progress toward her goals over the past 4 days of acute PT.   Follow Up Recommendations  SNF     Equipment Recommendations  Rolling walker with 5" wheels    Recommendations for Other Services       Precautions / Restrictions Precautions Precautions: Fall Restrictions Weight Bearing Restrictions: Yes RLE Weight Bearing: Weight bearing as tolerated    Mobility  Bed Mobility Overal bed mobility:  (pt received in chair and left in chair)                Transfers Overall transfer level: Needs assistance Equipment used: Rolling walker (2 wheeled) Transfers: Sit to/from Stand Sit to Stand: Min assist         General transfer comment: pt able to perform sit <>stand with less assistance from PT. Still requires vc for hand  placement.   Ambulation/Gait Ambulation/Gait assistance: Min assist Ambulation Distance (Feet): 125 Feet Assistive device: Rolling walker (2 wheeled) Gait Pattern/deviations: Step-through pattern;Decreased stride length   Gait velocity interpretation: <1.8 ft/sec, indicative of risk for recurrent falls General Gait Details: pt able to perform gait with less complaints of pain this morning. Greater bilat step lengths noted. Pt does better with independent steering of walker to avoid obstacles.    Stairs            Wheelchair Mobility    Modified Rankin (Stroke Patients Only)       Balance Overall balance assessment: No apparent balance deficits (not formally assessed)                                  Cognition Arousal/Alertness: Awake/alert Behavior During Therapy: WFL for tasks assessed/performed Overall Cognitive Status: Within Functional Limits for tasks assessed                      Exercises      General Comments        Pertinent Vitals/Pain Pain Assessment: No/denies pain Pain Location: R hip pain with movement Pain Intervention(s): Monitored during session;Premedicated before session;Repositioned    Home Living                      Prior Function  PT Goals (current goals can now be found in the care plan section) Acute Rehab PT Goals Patient Stated Goal: to get back to prior level of activity PT Goal Formulation: With patient Time For Goal Achievement: 01/10/15 Potential to Achieve Goals: Fair Progress towards PT goals: Progressing toward goals    Frequency  BID    PT Plan Current plan remains appropriate    Co-evaluation             End of Session Equipment Utilized During Treatment: Gait belt Activity Tolerance: Patient tolerated treatment well Patient left: in chair;with chair alarm set;with call bell/phone within reach;with family/visitor present     Time: [id number] PT Time  Calculation (min) (ACUTE ONLY): 25 min  Charges:                       G CodesMilon Score 01/13/2015, 10:49 AM

## 2015-01-16 DIAGNOSIS — E039 Hypothyroidism, unspecified: Secondary | ICD-10-CM | POA: Diagnosis not present

## 2015-01-16 DIAGNOSIS — M810 Age-related osteoporosis without current pathological fracture: Secondary | ICD-10-CM | POA: Diagnosis not present

## 2015-01-16 DIAGNOSIS — D649 Anemia, unspecified: Secondary | ICD-10-CM | POA: Diagnosis not present

## 2015-01-17 ENCOUNTER — Encounter
Admission: RE | Admit: 2015-01-17 | Discharge: 2015-01-17 | Disposition: A | Discharge status: Home / Self Care | Payer: Commercial Managed Care - HMO | Source: Ambulatory Visit | Attending: Internal Medicine | Admitting: Internal Medicine

## 2015-01-30 DIAGNOSIS — W19XXXD Unspecified fall, subsequent encounter: Secondary | ICD-10-CM | POA: Diagnosis not present

## 2015-01-30 DIAGNOSIS — Z85038 Personal history of other malignant neoplasm of large intestine: Secondary | ICD-10-CM | POA: Diagnosis not present

## 2015-01-30 DIAGNOSIS — H9190 Unspecified hearing loss, unspecified ear: Secondary | ICD-10-CM | POA: Diagnosis not present

## 2015-01-30 DIAGNOSIS — M810 Age-related osteoporosis without current pathological fracture: Secondary | ICD-10-CM | POA: Diagnosis not present

## 2015-01-30 DIAGNOSIS — S72141D Displaced intertrochanteric fracture of right femur, subsequent encounter for closed fracture with routine healing: Secondary | ICD-10-CM | POA: Diagnosis not present

## 2015-02-01 DIAGNOSIS — W19XXXD Unspecified fall, subsequent encounter: Secondary | ICD-10-CM | POA: Diagnosis not present

## 2015-02-01 DIAGNOSIS — S72141D Displaced intertrochanteric fracture of right femur, subsequent encounter for closed fracture with routine healing: Secondary | ICD-10-CM | POA: Diagnosis not present

## 2015-02-01 DIAGNOSIS — H9190 Unspecified hearing loss, unspecified ear: Secondary | ICD-10-CM | POA: Diagnosis not present

## 2015-02-01 DIAGNOSIS — M810 Age-related osteoporosis without current pathological fracture: Secondary | ICD-10-CM | POA: Diagnosis not present

## 2015-02-01 DIAGNOSIS — Z85038 Personal history of other malignant neoplasm of large intestine: Secondary | ICD-10-CM | POA: Diagnosis not present

## 2015-02-06 DIAGNOSIS — Z85038 Personal history of other malignant neoplasm of large intestine: Secondary | ICD-10-CM | POA: Diagnosis not present

## 2015-02-06 DIAGNOSIS — H9190 Unspecified hearing loss, unspecified ear: Secondary | ICD-10-CM | POA: Diagnosis not present

## 2015-02-06 DIAGNOSIS — M810 Age-related osteoporosis without current pathological fracture: Secondary | ICD-10-CM | POA: Diagnosis not present

## 2015-02-06 DIAGNOSIS — W19XXXD Unspecified fall, subsequent encounter: Secondary | ICD-10-CM | POA: Diagnosis not present

## 2015-02-06 DIAGNOSIS — S72141D Displaced intertrochanteric fracture of right femur, subsequent encounter for closed fracture with routine healing: Secondary | ICD-10-CM | POA: Diagnosis not present

## 2015-02-07 DIAGNOSIS — S72141D Displaced intertrochanteric fracture of right femur, subsequent encounter for closed fracture with routine healing: Secondary | ICD-10-CM | POA: Diagnosis not present

## 2015-02-07 DIAGNOSIS — H9190 Unspecified hearing loss, unspecified ear: Secondary | ICD-10-CM | POA: Diagnosis not present

## 2015-02-07 DIAGNOSIS — M810 Age-related osteoporosis without current pathological fracture: Secondary | ICD-10-CM | POA: Diagnosis not present

## 2015-02-07 DIAGNOSIS — Z85038 Personal history of other malignant neoplasm of large intestine: Secondary | ICD-10-CM | POA: Diagnosis not present

## 2015-02-07 DIAGNOSIS — W19XXXD Unspecified fall, subsequent encounter: Secondary | ICD-10-CM | POA: Diagnosis not present

## 2015-02-13 DIAGNOSIS — S72141A Displaced intertrochanteric fracture of right femur, initial encounter for closed fracture: Secondary | ICD-10-CM | POA: Diagnosis not present

## 2015-02-15 DIAGNOSIS — S72141D Displaced intertrochanteric fracture of right femur, subsequent encounter for closed fracture with routine healing: Secondary | ICD-10-CM | POA: Diagnosis not present

## 2015-02-15 DIAGNOSIS — Z85038 Personal history of other malignant neoplasm of large intestine: Secondary | ICD-10-CM | POA: Diagnosis not present

## 2015-02-15 DIAGNOSIS — W19XXXD Unspecified fall, subsequent encounter: Secondary | ICD-10-CM | POA: Diagnosis not present

## 2015-02-15 DIAGNOSIS — H9190 Unspecified hearing loss, unspecified ear: Secondary | ICD-10-CM | POA: Diagnosis not present

## 2015-02-15 DIAGNOSIS — M810 Age-related osteoporosis without current pathological fracture: Secondary | ICD-10-CM | POA: Diagnosis not present

## 2015-02-17 DIAGNOSIS — S72141D Displaced intertrochanteric fracture of right femur, subsequent encounter for closed fracture with routine healing: Secondary | ICD-10-CM | POA: Diagnosis not present

## 2015-02-17 DIAGNOSIS — H9190 Unspecified hearing loss, unspecified ear: Secondary | ICD-10-CM | POA: Diagnosis not present

## 2015-02-17 DIAGNOSIS — M810 Age-related osteoporosis without current pathological fracture: Secondary | ICD-10-CM | POA: Diagnosis not present

## 2015-02-17 DIAGNOSIS — Z85038 Personal history of other malignant neoplasm of large intestine: Secondary | ICD-10-CM | POA: Diagnosis not present

## 2015-02-17 DIAGNOSIS — W19XXXD Unspecified fall, subsequent encounter: Secondary | ICD-10-CM | POA: Diagnosis not present

## 2015-02-20 DIAGNOSIS — H9190 Unspecified hearing loss, unspecified ear: Secondary | ICD-10-CM | POA: Diagnosis not present

## 2015-02-20 DIAGNOSIS — W19XXXD Unspecified fall, subsequent encounter: Secondary | ICD-10-CM | POA: Diagnosis not present

## 2015-02-20 DIAGNOSIS — S72141D Displaced intertrochanteric fracture of right femur, subsequent encounter for closed fracture with routine healing: Secondary | ICD-10-CM | POA: Diagnosis not present

## 2015-02-20 DIAGNOSIS — M810 Age-related osteoporosis without current pathological fracture: Secondary | ICD-10-CM | POA: Diagnosis not present

## 2015-02-20 DIAGNOSIS — Z85038 Personal history of other malignant neoplasm of large intestine: Secondary | ICD-10-CM | POA: Diagnosis not present

## 2015-02-21 DIAGNOSIS — H9190 Unspecified hearing loss, unspecified ear: Secondary | ICD-10-CM | POA: Diagnosis not present

## 2015-02-21 DIAGNOSIS — M810 Age-related osteoporosis without current pathological fracture: Secondary | ICD-10-CM | POA: Diagnosis not present

## 2015-02-21 DIAGNOSIS — W19XXXD Unspecified fall, subsequent encounter: Secondary | ICD-10-CM | POA: Diagnosis not present

## 2015-02-21 DIAGNOSIS — Z85038 Personal history of other malignant neoplasm of large intestine: Secondary | ICD-10-CM | POA: Diagnosis not present

## 2015-02-21 DIAGNOSIS — S72141D Displaced intertrochanteric fracture of right femur, subsequent encounter for closed fracture with routine healing: Secondary | ICD-10-CM | POA: Diagnosis not present

## 2015-02-22 DIAGNOSIS — M810 Age-related osteoporosis without current pathological fracture: Secondary | ICD-10-CM | POA: Diagnosis not present

## 2015-02-22 DIAGNOSIS — S72141D Displaced intertrochanteric fracture of right femur, subsequent encounter for closed fracture with routine healing: Secondary | ICD-10-CM | POA: Diagnosis not present

## 2015-02-22 DIAGNOSIS — Z85038 Personal history of other malignant neoplasm of large intestine: Secondary | ICD-10-CM | POA: Diagnosis not present

## 2015-02-22 DIAGNOSIS — W19XXXD Unspecified fall, subsequent encounter: Secondary | ICD-10-CM | POA: Diagnosis not present

## 2015-02-22 DIAGNOSIS — H9190 Unspecified hearing loss, unspecified ear: Secondary | ICD-10-CM | POA: Diagnosis not present

## 2015-02-23 DIAGNOSIS — S72141D Displaced intertrochanteric fracture of right femur, subsequent encounter for closed fracture with routine healing: Secondary | ICD-10-CM | POA: Diagnosis not present

## 2015-02-23 DIAGNOSIS — W19XXXD Unspecified fall, subsequent encounter: Secondary | ICD-10-CM | POA: Diagnosis not present

## 2015-02-23 DIAGNOSIS — Z85038 Personal history of other malignant neoplasm of large intestine: Secondary | ICD-10-CM | POA: Diagnosis not present

## 2015-02-23 DIAGNOSIS — H9190 Unspecified hearing loss, unspecified ear: Secondary | ICD-10-CM | POA: Diagnosis not present

## 2015-02-23 DIAGNOSIS — M810 Age-related osteoporosis without current pathological fracture: Secondary | ICD-10-CM | POA: Diagnosis not present

## 2015-02-24 DIAGNOSIS — H02839 Dermatochalasis of unspecified eye, unspecified eyelid: Secondary | ICD-10-CM | POA: Diagnosis not present

## 2015-02-24 DIAGNOSIS — H18413 Arcus senilis, bilateral: Secondary | ICD-10-CM | POA: Diagnosis not present

## 2015-02-24 DIAGNOSIS — H2513 Age-related nuclear cataract, bilateral: Secondary | ICD-10-CM | POA: Diagnosis not present

## 2015-02-27 DIAGNOSIS — S72141D Displaced intertrochanteric fracture of right femur, subsequent encounter for closed fracture with routine healing: Secondary | ICD-10-CM | POA: Diagnosis not present

## 2015-02-27 DIAGNOSIS — Z85038 Personal history of other malignant neoplasm of large intestine: Secondary | ICD-10-CM | POA: Diagnosis not present

## 2015-02-27 DIAGNOSIS — M810 Age-related osteoporosis without current pathological fracture: Secondary | ICD-10-CM | POA: Diagnosis not present

## 2015-02-27 DIAGNOSIS — W19XXXD Unspecified fall, subsequent encounter: Secondary | ICD-10-CM | POA: Diagnosis not present

## 2015-02-27 DIAGNOSIS — H9190 Unspecified hearing loss, unspecified ear: Secondary | ICD-10-CM | POA: Diagnosis not present

## 2015-02-28 DIAGNOSIS — W19XXXD Unspecified fall, subsequent encounter: Secondary | ICD-10-CM | POA: Diagnosis not present

## 2015-02-28 DIAGNOSIS — Z85038 Personal history of other malignant neoplasm of large intestine: Secondary | ICD-10-CM | POA: Diagnosis not present

## 2015-02-28 DIAGNOSIS — S72141D Displaced intertrochanteric fracture of right femur, subsequent encounter for closed fracture with routine healing: Secondary | ICD-10-CM | POA: Diagnosis not present

## 2015-02-28 DIAGNOSIS — H9190 Unspecified hearing loss, unspecified ear: Secondary | ICD-10-CM | POA: Diagnosis not present

## 2015-02-28 DIAGNOSIS — M810 Age-related osteoporosis without current pathological fracture: Secondary | ICD-10-CM | POA: Diagnosis not present

## 2015-03-02 DIAGNOSIS — Z85038 Personal history of other malignant neoplasm of large intestine: Secondary | ICD-10-CM | POA: Diagnosis not present

## 2015-03-02 DIAGNOSIS — H9190 Unspecified hearing loss, unspecified ear: Secondary | ICD-10-CM | POA: Diagnosis not present

## 2015-03-02 DIAGNOSIS — S72141D Displaced intertrochanteric fracture of right femur, subsequent encounter for closed fracture with routine healing: Secondary | ICD-10-CM | POA: Diagnosis not present

## 2015-03-02 DIAGNOSIS — W19XXXD Unspecified fall, subsequent encounter: Secondary | ICD-10-CM | POA: Diagnosis not present

## 2015-03-02 DIAGNOSIS — M810 Age-related osteoporosis without current pathological fracture: Secondary | ICD-10-CM | POA: Diagnosis not present

## 2015-03-03 DIAGNOSIS — S72141D Displaced intertrochanteric fracture of right femur, subsequent encounter for closed fracture with routine healing: Secondary | ICD-10-CM | POA: Diagnosis not present

## 2015-03-03 DIAGNOSIS — W19XXXD Unspecified fall, subsequent encounter: Secondary | ICD-10-CM | POA: Diagnosis not present

## 2015-03-06 DIAGNOSIS — Z85038 Personal history of other malignant neoplasm of large intestine: Secondary | ICD-10-CM | POA: Diagnosis not present

## 2015-03-06 DIAGNOSIS — H9190 Unspecified hearing loss, unspecified ear: Secondary | ICD-10-CM | POA: Diagnosis not present

## 2015-03-06 DIAGNOSIS — W19XXXD Unspecified fall, subsequent encounter: Secondary | ICD-10-CM | POA: Diagnosis not present

## 2015-03-06 DIAGNOSIS — S72141D Displaced intertrochanteric fracture of right femur, subsequent encounter for closed fracture with routine healing: Secondary | ICD-10-CM | POA: Diagnosis not present

## 2015-03-06 DIAGNOSIS — M810 Age-related osteoporosis without current pathological fracture: Secondary | ICD-10-CM | POA: Diagnosis not present

## 2015-03-07 DIAGNOSIS — M810 Age-related osteoporosis without current pathological fracture: Secondary | ICD-10-CM | POA: Diagnosis not present

## 2015-03-07 DIAGNOSIS — S72141D Displaced intertrochanteric fracture of right femur, subsequent encounter for closed fracture with routine healing: Secondary | ICD-10-CM | POA: Diagnosis not present

## 2015-03-07 DIAGNOSIS — Z85038 Personal history of other malignant neoplasm of large intestine: Secondary | ICD-10-CM | POA: Diagnosis not present

## 2015-03-07 DIAGNOSIS — W19XXXD Unspecified fall, subsequent encounter: Secondary | ICD-10-CM | POA: Diagnosis not present

## 2015-03-07 DIAGNOSIS — H9190 Unspecified hearing loss, unspecified ear: Secondary | ICD-10-CM | POA: Diagnosis not present

## 2015-03-16 DIAGNOSIS — E890 Postprocedural hypothyroidism: Secondary | ICD-10-CM | POA: Diagnosis not present

## 2015-03-16 DIAGNOSIS — G629 Polyneuropathy, unspecified: Secondary | ICD-10-CM | POA: Diagnosis not present

## 2015-03-23 DIAGNOSIS — M79671 Pain in right foot: Secondary | ICD-10-CM | POA: Diagnosis not present

## 2015-03-23 DIAGNOSIS — E890 Postprocedural hypothyroidism: Secondary | ICD-10-CM | POA: Diagnosis not present

## 2015-03-23 DIAGNOSIS — R748 Abnormal levels of other serum enzymes: Secondary | ICD-10-CM | POA: Diagnosis not present

## 2015-03-23 DIAGNOSIS — S92351A Displaced fracture of fifth metatarsal bone, right foot, initial encounter for closed fracture: Secondary | ICD-10-CM | POA: Diagnosis not present

## 2015-03-23 DIAGNOSIS — H2511 Age-related nuclear cataract, right eye: Secondary | ICD-10-CM | POA: Diagnosis not present

## 2015-03-29 ENCOUNTER — Other Ambulatory Visit: Payer: Self-pay | Admitting: Internal Medicine

## 2015-03-29 DIAGNOSIS — R748 Abnormal levels of other serum enzymes: Secondary | ICD-10-CM

## 2015-04-04 ENCOUNTER — Ambulatory Visit: Payer: Commercial Managed Care - HMO

## 2015-04-10 DIAGNOSIS — H2511 Age-related nuclear cataract, right eye: Secondary | ICD-10-CM | POA: Diagnosis not present

## 2015-04-10 DIAGNOSIS — H25811 Combined forms of age-related cataract, right eye: Secondary | ICD-10-CM | POA: Diagnosis not present

## 2015-04-11 DIAGNOSIS — H2512 Age-related nuclear cataract, left eye: Secondary | ICD-10-CM | POA: Diagnosis not present

## 2015-05-01 DIAGNOSIS — H2511 Age-related nuclear cataract, right eye: Secondary | ICD-10-CM | POA: Diagnosis not present

## 2015-05-01 DIAGNOSIS — H2512 Age-related nuclear cataract, left eye: Secondary | ICD-10-CM | POA: Diagnosis not present

## 2015-05-01 DIAGNOSIS — H25812 Combined forms of age-related cataract, left eye: Secondary | ICD-10-CM | POA: Diagnosis not present

## 2015-06-14 DIAGNOSIS — E890 Postprocedural hypothyroidism: Secondary | ICD-10-CM | POA: Diagnosis not present

## 2015-06-21 DIAGNOSIS — E890 Postprocedural hypothyroidism: Secondary | ICD-10-CM | POA: Diagnosis not present

## 2015-07-18 DIAGNOSIS — L820 Inflamed seborrheic keratosis: Secondary | ICD-10-CM | POA: Diagnosis not present

## 2015-07-18 DIAGNOSIS — L578 Other skin changes due to chronic exposure to nonionizing radiation: Secondary | ICD-10-CM | POA: Diagnosis not present

## 2015-08-03 DIAGNOSIS — H43811 Vitreous degeneration, right eye: Secondary | ICD-10-CM | POA: Diagnosis not present

## 2015-08-03 DIAGNOSIS — H43313 Vitreous membranes and strands, bilateral: Secondary | ICD-10-CM | POA: Diagnosis not present

## 2015-08-04 ENCOUNTER — Encounter (INDEPENDENT_AMBULATORY_CARE_PROVIDER_SITE_OTHER): Payer: Commercial Managed Care - HMO | Admitting: Ophthalmology

## 2015-08-04 DIAGNOSIS — H353121 Nonexudative age-related macular degeneration, left eye, early dry stage: Secondary | ICD-10-CM | POA: Diagnosis not present

## 2015-08-04 DIAGNOSIS — H4311 Vitreous hemorrhage, right eye: Secondary | ICD-10-CM

## 2015-08-04 DIAGNOSIS — H43813 Vitreous degeneration, bilateral: Secondary | ICD-10-CM | POA: Diagnosis not present

## 2015-08-04 DIAGNOSIS — H348311 Tributary (branch) retinal vein occlusion, right eye, with retinal neovascularization: Secondary | ICD-10-CM

## 2015-08-25 ENCOUNTER — Encounter (INDEPENDENT_AMBULATORY_CARE_PROVIDER_SITE_OTHER): Payer: Commercial Managed Care - HMO | Admitting: Ophthalmology

## 2015-09-01 ENCOUNTER — Encounter (INDEPENDENT_AMBULATORY_CARE_PROVIDER_SITE_OTHER): Payer: Commercial Managed Care - HMO | Admitting: Ophthalmology

## 2015-09-01 DIAGNOSIS — H4311 Vitreous hemorrhage, right eye: Secondary | ICD-10-CM | POA: Diagnosis not present

## 2015-09-01 DIAGNOSIS — H34831 Tributary (branch) retinal vein occlusion, right eye, with macular edema: Secondary | ICD-10-CM

## 2015-11-10 ENCOUNTER — Encounter (INDEPENDENT_AMBULATORY_CARE_PROVIDER_SITE_OTHER): Payer: Commercial Managed Care - HMO | Admitting: Ophthalmology

## 2015-11-16 ENCOUNTER — Encounter (INDEPENDENT_AMBULATORY_CARE_PROVIDER_SITE_OTHER): Payer: Commercial Managed Care - HMO | Admitting: Ophthalmology

## 2015-11-16 DIAGNOSIS — H43813 Vitreous degeneration, bilateral: Secondary | ICD-10-CM

## 2015-11-16 DIAGNOSIS — L821 Other seborrheic keratosis: Secondary | ICD-10-CM | POA: Diagnosis not present

## 2015-11-16 DIAGNOSIS — L820 Inflamed seborrheic keratosis: Secondary | ICD-10-CM | POA: Diagnosis not present

## 2015-11-16 DIAGNOSIS — H353131 Nonexudative age-related macular degeneration, bilateral, early dry stage: Secondary | ICD-10-CM

## 2015-11-16 DIAGNOSIS — H348312 Tributary (branch) retinal vein occlusion, right eye, stable: Secondary | ICD-10-CM | POA: Diagnosis not present

## 2015-11-16 DIAGNOSIS — H4311 Vitreous hemorrhage, right eye: Secondary | ICD-10-CM | POA: Diagnosis not present

## 2015-12-14 DIAGNOSIS — E890 Postprocedural hypothyroidism: Secondary | ICD-10-CM | POA: Diagnosis not present

## 2015-12-21 ENCOUNTER — Other Ambulatory Visit: Payer: Self-pay | Admitting: Internal Medicine

## 2015-12-21 DIAGNOSIS — E890 Postprocedural hypothyroidism: Secondary | ICD-10-CM | POA: Diagnosis not present

## 2015-12-21 DIAGNOSIS — R1084 Generalized abdominal pain: Secondary | ICD-10-CM

## 2015-12-21 DIAGNOSIS — M545 Low back pain: Secondary | ICD-10-CM | POA: Diagnosis not present

## 2015-12-22 ENCOUNTER — Ambulatory Visit
Admission: RE | Admit: 2015-12-22 | Discharge: 2015-12-22 | Disposition: A | Discharge status: Home / Self Care | Payer: Commercial Managed Care - HMO | Source: Ambulatory Visit | Attending: Internal Medicine | Admitting: Internal Medicine

## 2015-12-22 DIAGNOSIS — N281 Cyst of kidney, acquired: Secondary | ICD-10-CM | POA: Insufficient documentation

## 2015-12-22 DIAGNOSIS — Z9889 Other specified postprocedural states: Secondary | ICD-10-CM | POA: Diagnosis not present

## 2015-12-22 DIAGNOSIS — J9811 Atelectasis: Secondary | ICD-10-CM | POA: Diagnosis not present

## 2015-12-22 DIAGNOSIS — R1084 Generalized abdominal pain: Secondary | ICD-10-CM | POA: Diagnosis not present

## 2015-12-22 DIAGNOSIS — Z8781 Personal history of (healed) traumatic fracture: Secondary | ICD-10-CM | POA: Insufficient documentation

## 2015-12-22 DIAGNOSIS — I70208 Unspecified atherosclerosis of native arteries of extremities, other extremity: Secondary | ICD-10-CM | POA: Diagnosis not present

## 2015-12-22 DIAGNOSIS — I517 Cardiomegaly: Secondary | ICD-10-CM | POA: Diagnosis not present

## 2015-12-22 DIAGNOSIS — M8580 Other specified disorders of bone density and structure, unspecified site: Secondary | ICD-10-CM | POA: Insufficient documentation

## 2015-12-22 DIAGNOSIS — R109 Unspecified abdominal pain: Secondary | ICD-10-CM | POA: Diagnosis not present

## 2015-12-22 DIAGNOSIS — M4856XA Collapsed vertebra, not elsewhere classified, lumbar region, initial encounter for fracture: Secondary | ICD-10-CM | POA: Insufficient documentation

## 2015-12-27 ENCOUNTER — Ambulatory Visit: Payer: Commercial Managed Care - HMO

## 2015-12-29 DIAGNOSIS — M47816 Spondylosis without myelopathy or radiculopathy, lumbar region: Secondary | ICD-10-CM | POA: Diagnosis not present

## 2015-12-29 DIAGNOSIS — S32040S Wedge compression fracture of fourth lumbar vertebra, sequela: Secondary | ICD-10-CM | POA: Diagnosis not present

## 2015-12-29 DIAGNOSIS — S32010S Wedge compression fracture of first lumbar vertebra, sequela: Secondary | ICD-10-CM | POA: Diagnosis not present

## 2016-01-11 ENCOUNTER — Emergency Department
Admission: EM | Admit: 2016-01-11 | Discharge: 2016-01-11 | Disposition: A | Discharge status: Home / Self Care | Payer: Commercial Managed Care - HMO | Attending: Emergency Medicine | Admitting: Emergency Medicine

## 2016-01-11 ENCOUNTER — Encounter: Payer: Self-pay | Admitting: Emergency Medicine

## 2016-01-11 DIAGNOSIS — X58XXXA Exposure to other specified factors, initial encounter: Secondary | ICD-10-CM | POA: Diagnosis not present

## 2016-01-11 DIAGNOSIS — E039 Hypothyroidism, unspecified: Secondary | ICD-10-CM | POA: Diagnosis not present

## 2016-01-11 DIAGNOSIS — R0781 Pleurodynia: Secondary | ICD-10-CM | POA: Diagnosis not present

## 2016-01-11 DIAGNOSIS — Y929 Unspecified place or not applicable: Secondary | ICD-10-CM | POA: Insufficient documentation

## 2016-01-11 DIAGNOSIS — G8929 Other chronic pain: Secondary | ICD-10-CM | POA: Insufficient documentation

## 2016-01-11 DIAGNOSIS — M5441 Lumbago with sciatica, right side: Secondary | ICD-10-CM | POA: Insufficient documentation

## 2016-01-11 DIAGNOSIS — Z85038 Personal history of other malignant neoplasm of large intestine: Secondary | ICD-10-CM | POA: Diagnosis not present

## 2016-01-11 DIAGNOSIS — Y939 Activity, unspecified: Secondary | ICD-10-CM | POA: Diagnosis not present

## 2016-01-11 DIAGNOSIS — S39012A Strain of muscle, fascia and tendon of lower back, initial encounter: Secondary | ICD-10-CM | POA: Diagnosis not present

## 2016-01-11 DIAGNOSIS — Z79899 Other long term (current) drug therapy: Secondary | ICD-10-CM | POA: Diagnosis not present

## 2016-01-11 DIAGNOSIS — Y999 Unspecified external cause status: Secondary | ICD-10-CM | POA: Insufficient documentation

## 2016-01-11 DIAGNOSIS — T148XXA Other injury of unspecified body region, initial encounter: Secondary | ICD-10-CM | POA: Diagnosis not present

## 2016-01-11 DIAGNOSIS — M545 Low back pain: Secondary | ICD-10-CM | POA: Diagnosis present

## 2016-01-11 DIAGNOSIS — Z7982 Long term (current) use of aspirin: Secondary | ICD-10-CM | POA: Insufficient documentation

## 2016-01-11 MED ORDER — TRAMADOL HCL 50 MG PO TABS: ORAL_TABLET | ORAL | 0 refills | Status: DC

## 2016-01-11 NOTE — ED Notes (Signed)

## 2016-01-11 NOTE — Discharge Instructions (Signed)
You have been seen in the Emergency Department (ED)  today for back pain.  Your workup and exam have not shown any acute abnormalities and you are likely suffering from muscle strain or possible problems with your discs, but there is no treatment that will fix your symptoms at this time.    Take Tramadol as prescribed for severe pain. Do not drink alcohol, drive or participate in any other potentially dangerous activities while taking this medication as it may make you sleepy. Do not take this medication with any other sedating medications, either prescription or over-the-counter. If you were prescribed Percocet or Vicodin, do not take these with acetaminophen (Tylenol) as it is already contained within these medications.   This medication is similar to an opiate (or narcotic) pain medication.  Use it as little as possible to achieve adequate pain control.  Do not use or use it with extreme caution if you have a history of opiate abuse or dependence.  If you are on a pain contract with your primary care doctor or a pain specialist, be sure to let them know you were prescribed this medication today from the [facility] Emergency Department.  This medication is intended for your use only - do not give any to anyone else and keep it in a secure place where nobody else, especially children, have access to it.  It will also cause or worsen constipation, so you may want to consider taking an over-the-counter stool softener while you are taking this medication.  Please follow up with your doctor as soon as possible regarding today's ED visit and your back pain.  Return to the ED for worsening back pain, fever, weakness or numbness of either leg, or if you develop either (1) an inability to urinate or have bowel movements, or (2) loss of your ability to control your bathroom functions (if you start having "accidents"), or if you develop other new symptoms that concern you.

## 2016-01-11 NOTE — ED Provider Notes (Addendum)
[facility] Emergency Department Provider Note  ____________________________________________   First MD Initiated Contact with Patient 01/11/16 1606     (approximate)  I have reviewed the triage vital signs and the nursing notes.   HISTORY  Chief Complaint Back Pain    HPI Christine [name] is a 82 y.o. female who is living independently although her daughter is nearby and who remains quite active presents for evaluation of lower back pain that radiates around her right side.  The pain has been gradual in onset and constant over the last month.  She has seen her primary care doctor and Dr. Roland Rack.She has had a CT scan of the abdomen and pelvis which showed some chronic compression fractures in her lower back and he has also had some regular plain films.  Dr. Roland Rack told her that he thought it was likely a pulled muscle but her daughter was concerned about how much discomfort she is and when she is up and moving around and thought she should have another evaluation.  She does have frequent issues with her GI system where she alternates between diarrhea and constipation.  She is also "quite gassy" and belches loudly whenever her back or her abdomen or palpitated.  She denies fever/chills, chest pain, shortness of breath, nausea, vomiting.  The pain in her back is mild to severe depending on how and where she is moving.  She is chronically incontinent of urine at night but has not had any issues with urinary retention.  She has no numbness or tingling down her legs and is able to bear weight, just has a reproducible back pain.   Past Medical History:  Diagnosis Date  . Cancer ([place])    colon  . Carpal tunnel syndrome   . DJD (degenerative joint disease)   . Hearing loss   . Hypothyroidism   . Osteoporosis   . Wears glasses     Patient Active Problem List   Diagnosis Date Noted  . Hip fracture ([place]) 01/09/2015    Past Surgical History:  Procedure Laterality  Date  . ABDOMINAL HYSTERECTOMY    . COLON RESECTION     for colon cancer  . INTRAMEDULLARY (IM) NAIL INTERTROCHANTERIC Right 01/09/2015   Procedure: INTRAMEDULLARY (IM) NAIL INTERTROCHANTRIC;  Surgeon: Corky Mull, MD;  Location: [facility] ORS;  Service: Orthopedics;  Laterality: Right;  . Lung surgery     as a child for empyema  . OOPHORECTOMY    . THYROID SURGERY  1967   for multinodular goitre    Prior to Admission medications   Medication Sig Start Date End Date Taking? Authorizing Provider  acetaminophen (TYLENOL) 325 MG tablet Take 2 tablets (650 mg total) by mouth every 6 (six) hours as needed for mild pain (or Fever >/= 101). 01/13/15   Glendon Axe, MD  ALPRAZolam Duanne Moron) 0.25 MG tablet Take 1 tablet (0.25 mg total) by mouth 2 (two) times daily as needed for anxiety. 01/13/15   Glendon Axe, MD  aspirin EC 81 MG tablet Take 81 mg by mouth daily as needed for mild pain or moderate pain.    Historical Provider, MD  bisacodyl (DULCOLAX) 10 MG suppository Place 1 suppository (10 mg total) rectally daily as needed for moderate constipation. 01/13/15   Glendon Axe, MD  Calcium Carb-Cholecalciferol (CALCIUM 600 + D) 600-200 MG-UNIT TABS Take 1 tablet by mouth 2 (two) times daily. With lunch and dinner    Historical Provider, MD  Cholecalciferol (VITAMIN D) 1000 UNITS  capsule Take 1,000 Units by mouth every evening.     Historical Provider, MD  diphenhydrAMINE (BENADRYL) 12.5 MG/5ML elixir Take 5-10 mLs (12.5-25 mg total) by mouth every 4 (four) hours as needed for itching. 01/13/15   Glendon Axe, MD  docusate sodium (COLACE) 100 MG capsule Take 1 capsule (100 mg total) by mouth 2 (two) times daily. 01/13/15   Glendon Axe, MD  enoxaparin (LOVENOX) 40 MG/0.4ML injection Inject 0.4 mLs (40 mg total) into the skin daily. 01/12/15   Lattie Corns, PA-C  levothyroxine (SYNTHROID, LEVOTHROID) 100 MCG tablet Take 100 mcg by mouth daily.    Historical Provider, MD  ondansetron (ZOFRAN) 4  MG tablet Take 1 tablet (4 mg total) by mouth every 6 (six) hours as needed for nausea. 01/13/15   Glendon Axe, MD  oxyCODONE (OXY IR/ROXICODONE) 5 MG immediate release tablet Take 1-2 tablets (5-10 mg total) by mouth every 3 (three) hours as needed for breakthrough pain. 01/13/15   Glendon Axe, MD  pantoprazole (PROTONIX) 40 MG tablet Take 1 tablet (40 mg total) by mouth 2 (two) times daily. 01/13/15   Glendon Axe, MD  polyethylene glycol (MIRALAX / GLYCOLAX) packet Take 17 g by mouth daily as needed for moderate constipation. 01/13/15   Glendon Axe, MD  simethicone (MYLICON) 80 MG chewable tablet Chew 1 tablet (80 mg total) by mouth 4 (four) times daily as needed for flatulence. 01/13/15   Glendon Axe, MD  traMADol (ULTRAM) 50 MG tablet Take 1 tablet by mouth every 6 hours as needed for moderate to severe pain 01/11/16   Hinda Kehr, MD    Allergies Review of patient's allergies indicates no known allergies.  Family History  Problem Relation Age of Onset  . Heart disease Father   . Diabetes Sister   . Cancer Sister   . Diabetes Brother   . Cancer Brother   . Cancer Mother     Social History Social History  Substance Use Topics  . Smoking status: Never Smoker  . Smokeless tobacco: Never Used  . Alcohol use No    Review of Systems Constitutional: No fever/chills Eyes: No visual changes. ENT: No sore throat. Cardiovascular: Denies chest pain. Respiratory: Denies shortness of breath. Gastrointestinal: No abdominal pain.  No nausea, no vomiting.  Alternating episodes of diarrhea and constipation Genitourinary: Negative for dysuria. Musculoskeletal: Low back pain radiating around her right Skin: Negative for rash. Neurological: Negative for headaches, focal weakness or numbness.  10-point ROS otherwise negative.  ____________________________________________   PHYSICAL EXAM:  VITAL SIGNS: ED Triage Vitals  Enc Vitals Group     BP 01/11/16 1506 (!) 186/65      Pulse Rate 01/11/16 1506 75     Resp 01/11/16 1506 18     Temp 01/11/16 1506 98.2 F (36.8 C)     Temp Source 01/11/16 1506 Oral     SpO2 01/11/16 1506 99 %     Weight 01/11/16 1506 119 lb (54 kg)     Height 01/11/16 1506 5\' 3"  (1.6 m)     Head Circumference --      Peak Flow --      Pain Score 01/11/16 1507 0     Pain Loc --      Pain Edu? --      Excl. in [place]? --     Constitutional: Alert and oriented. Well appearing and in no acute distress. Eyes: Conjunctivae are normal. PERRL. EOMI. Head: Atraumatic. Nose: No congestion/rhinnorhea. Mouth/Throat: Mucous membranes are moist.  Oropharynx non-erythematous. Neck: No stridor.  No meningeal signs.   Cardiovascular: Normal rate, regular rhythm. Good peripheral circulation. Grossly normal heart sounds. Respiratory: Normal respiratory effort.  No retractions. Lungs CTAB. Gastrointestinal: Soft and nontender. No distention.  She belches occasionally when palpating her abdomen but does not have any specific point tenderness Musculoskeletal: Reproducible tenderness to palpation of the right posterior rib cage and her lumbar spine.  There is no specific point tenderness of the spine itself but the whole area is tender, more so in the soft tissues to the right of her lumbar spine as it comes around her right flank. Neurologic:  Normal speech and language. No gross focal neurologic deficits are appreciated.  Skin:  Skin is warm, dry and intact. No rash noted. Psychiatric: Mood and affect are normal. Speech and behavior are normal.  ____________________________________________   LABS (all labs ordered are listed, but only abnormal results are displayed)  Labs Reviewed - No data to display ____________________________________________  EKG  None - EKG not ordered by ED physician ____________________________________________  RADIOLOGY   No results found.  ____________________________________________   PROCEDURES  Procedure(s)  performed:   Procedures   Critical Care performed: No ____________________________________________   INITIAL IMPRESSION / ASSESSMENT AND PLAN / ED COURSE  Pertinent labs & imaging results that were available during my care of the patient were reviewed by me and considered in my medical decision making (see chart for details).  Patient has no focal neurological deficits and her symptoms been chronic for an extended period of time.  She has had a recent CT scan of the abdomen and pelvis which was generally reassuring except for the "unknown chronicity" compression fractures.  We discussed the possibility of additional imaging but I explained I think that her workup would be best guided by Dr. Roland Rack at this point rather than putting her through unnecessary MRIs or additional CT scans.  We agreed that we would try improved pain control with tramadol and close outpatient follow-up.  The patient and daughter understand and agree with the plan.  The patient is eager to get back home to do more housework and gardening, and I encouraged her to do as much activity as she feels comfortable.  She has a reassuring medical screening exam with no evidence of acute or emergent medical condition at this time.   ____________________________________________  FINAL CLINICAL IMPRESSION(S) / ED DIAGNOSES  Final diagnoses:  Chronic right-sided low back pain with right-sided sciatica  Muscle strain     MEDICATIONS GIVEN DURING THIS VISIT:  Medications - No data to display   NEW OUTPATIENT MEDICATIONS STARTED DURING THIS VISIT:  New Prescriptions   TRAMADOL (ULTRAM) 50 MG TABLET    Take 1 tablet by mouth every 6 hours as needed for moderate to severe pain    Modified Medications   No medications on file    Discontinued Medications   TRAMADOL (ULTRAM) 50 MG TABLET    Take 1 tablet (50 mg total) by mouth every 6 (six) hours as needed for moderate pain or severe pain.     Note:  This document was  prepared using Dragon voice recognition software and may include unintentional dictation errors.    Hinda Kehr, MD 01/11/16 1652    Hinda Kehr, MD 01/11/16 [phone]

## 2016-01-11 NOTE — ED Triage Notes (Signed)
Pt to ED c/o lower back pain for several weeks radiating around to right side.  Pt seen by Dr. Candiss Norse and Dr. Benson Setting and told either compression fracture or pulled muscle.  Pain is intermittent sharp pain.  Denies new urinary symptoms but is incontinent at night.  Pt A&Ox4, speaking in complete and coherent sentences, chest rise even and unlabored.

## 2016-01-12 DIAGNOSIS — G8929 Other chronic pain: Secondary | ICD-10-CM | POA: Diagnosis not present

## 2016-01-12 DIAGNOSIS — M545 Low back pain: Secondary | ICD-10-CM | POA: Diagnosis not present

## 2016-01-12 DIAGNOSIS — S72009A Fracture of unspecified part of neck of unspecified femur, initial encounter for closed fracture: Secondary | ICD-10-CM | POA: Diagnosis not present

## 2016-01-18 DIAGNOSIS — N179 Acute kidney failure, unspecified: Secondary | ICD-10-CM | POA: Diagnosis not present

## 2016-01-18 DIAGNOSIS — R809 Proteinuria, unspecified: Secondary | ICD-10-CM | POA: Diagnosis not present

## 2016-01-18 DIAGNOSIS — E559 Vitamin D deficiency, unspecified: Secondary | ICD-10-CM | POA: Diagnosis not present

## 2016-01-29 DIAGNOSIS — N179 Acute kidney failure, unspecified: Secondary | ICD-10-CM | POA: Diagnosis not present

## 2016-01-29 DIAGNOSIS — R809 Proteinuria, unspecified: Secondary | ICD-10-CM | POA: Diagnosis not present

## 2016-01-29 DIAGNOSIS — E559 Vitamin D deficiency, unspecified: Secondary | ICD-10-CM | POA: Diagnosis not present

## 2016-02-28 DIAGNOSIS — M7061 Trochanteric bursitis, right hip: Secondary | ICD-10-CM | POA: Diagnosis not present

## 2016-04-10 DIAGNOSIS — E890 Postprocedural hypothyroidism: Secondary | ICD-10-CM | POA: Diagnosis not present

## 2016-04-17 DIAGNOSIS — R634 Abnormal weight loss: Secondary | ICD-10-CM | POA: Diagnosis not present

## 2016-04-17 DIAGNOSIS — N289 Disorder of kidney and ureter, unspecified: Secondary | ICD-10-CM | POA: Diagnosis not present

## 2016-04-17 DIAGNOSIS — E890 Postprocedural hypothyroidism: Secondary | ICD-10-CM | POA: Diagnosis not present

## 2016-05-16 ENCOUNTER — Ambulatory Visit (INDEPENDENT_AMBULATORY_CARE_PROVIDER_SITE_OTHER): Payer: Commercial Managed Care - HMO | Admitting: Ophthalmology

## 2016-07-10 DIAGNOSIS — E890 Postprocedural hypothyroidism: Secondary | ICD-10-CM | POA: Diagnosis not present

## 2016-07-18 DIAGNOSIS — R600 Localized edema: Secondary | ICD-10-CM | POA: Diagnosis not present

## 2016-07-18 DIAGNOSIS — E890 Postprocedural hypothyroidism: Secondary | ICD-10-CM | POA: Diagnosis not present

## 2016-07-18 DIAGNOSIS — M810 Age-related osteoporosis without current pathological fracture: Secondary | ICD-10-CM | POA: Diagnosis not present

## 2016-10-15 DIAGNOSIS — M1611 Unilateral primary osteoarthritis, right hip: Secondary | ICD-10-CM | POA: Diagnosis not present

## 2016-10-15 DIAGNOSIS — S72141P Displaced intertrochanteric fracture of right femur, subsequent encounter for closed fracture with malunion: Secondary | ICD-10-CM | POA: Diagnosis not present

## 2016-10-15 DIAGNOSIS — M25551 Pain in right hip: Secondary | ICD-10-CM | POA: Diagnosis not present

## 2016-10-17 DIAGNOSIS — M810 Age-related osteoporosis without current pathological fracture: Secondary | ICD-10-CM | POA: Diagnosis not present

## 2016-10-17 DIAGNOSIS — Z01818 Encounter for other preprocedural examination: Secondary | ICD-10-CM | POA: Diagnosis not present

## 2016-10-17 DIAGNOSIS — E890 Postprocedural hypothyroidism: Secondary | ICD-10-CM | POA: Diagnosis not present

## 2016-10-17 DIAGNOSIS — R600 Localized edema: Secondary | ICD-10-CM | POA: Diagnosis not present

## 2016-10-17 NOTE — Pre-Procedure Instructions (Signed)
SPOKE WITH DR Gildardo Griffes AND NOTIFIED TIFFANY AT DR [facility] OFFICE PATIENT DOES NEED MEDICAL CLEARANCE AND EKG. CARDIAC CLEARANCE SHOULD BE ARRANGED BY PCP IF NECESSARY BY THEIR ASSESSMENT

## 2016-10-21 ENCOUNTER — Encounter
Admission: RE | Admit: 2016-10-21 | Discharge: 2016-10-21 | Disposition: A | Discharge status: Home / Self Care | Payer: Medicare HMO | Source: Ambulatory Visit | Attending: Surgery | Admitting: Surgery

## 2016-10-21 ENCOUNTER — Ambulatory Visit
Admission: RE | Admit: 2016-10-21 | Discharge: 2016-10-21 | Disposition: A | Discharge status: Home / Self Care | Payer: Medicare HMO | Source: Ambulatory Visit | Attending: Surgery | Admitting: Surgery

## 2016-10-21 DIAGNOSIS — R918 Other nonspecific abnormal finding of lung field: Secondary | ICD-10-CM

## 2016-10-21 DIAGNOSIS — Z471 Aftercare following joint replacement surgery: Secondary | ICD-10-CM | POA: Diagnosis not present

## 2016-10-21 DIAGNOSIS — M545 Low back pain: Secondary | ICD-10-CM | POA: Diagnosis not present

## 2016-10-21 DIAGNOSIS — S72143A Displaced intertrochanteric fracture of unspecified femur, initial encounter for closed fracture: Secondary | ICD-10-CM | POA: Diagnosis not present

## 2016-10-21 DIAGNOSIS — M1651 Unilateral post-traumatic osteoarthritis, right hip: Secondary | ICD-10-CM | POA: Diagnosis not present

## 2016-10-21 DIAGNOSIS — M1611 Unilateral primary osteoarthritis, right hip: Secondary | ICD-10-CM | POA: Diagnosis present

## 2016-10-21 DIAGNOSIS — M810 Age-related osteoporosis without current pathological fracture: Secondary | ICD-10-CM | POA: Diagnosis present

## 2016-10-21 DIAGNOSIS — I517 Cardiomegaly: Secondary | ICD-10-CM | POA: Insufficient documentation

## 2016-10-21 DIAGNOSIS — S72009A Fracture of unspecified part of neck of unspecified femur, initial encounter for closed fracture: Secondary | ICD-10-CM | POA: Diagnosis not present

## 2016-10-21 DIAGNOSIS — M25551 Pain in right hip: Secondary | ICD-10-CM | POA: Diagnosis present

## 2016-10-21 DIAGNOSIS — Z96641 Presence of right artificial hip joint: Secondary | ICD-10-CM | POA: Diagnosis not present

## 2016-10-21 DIAGNOSIS — E039 Hypothyroidism, unspecified: Secondary | ICD-10-CM | POA: Diagnosis present

## 2016-10-21 DIAGNOSIS — Z01818 Encounter for other preprocedural examination: Secondary | ICD-10-CM

## 2016-10-21 DIAGNOSIS — D62 Acute posthemorrhagic anemia: Secondary | ICD-10-CM | POA: Diagnosis not present

## 2016-10-21 DIAGNOSIS — I700 Atherosclerosis of aorta: Secondary | ICD-10-CM

## 2016-10-21 DIAGNOSIS — Z85038 Personal history of other malignant neoplasm of large intestine: Secondary | ICD-10-CM | POA: Diagnosis not present

## 2016-10-21 DIAGNOSIS — G8929 Other chronic pain: Secondary | ICD-10-CM | POA: Diagnosis not present

## 2016-10-21 DIAGNOSIS — Z01811 Encounter for preprocedural respiratory examination: Secondary | ICD-10-CM

## 2016-10-21 DIAGNOSIS — M1711 Unilateral primary osteoarthritis, right knee: Secondary | ICD-10-CM

## 2016-10-21 DIAGNOSIS — D500 Iron deficiency anemia secondary to blood loss (chronic): Secondary | ICD-10-CM | POA: Diagnosis present

## 2016-10-21 DIAGNOSIS — S72141A Displaced intertrochanteric fracture of right femur, initial encounter for closed fracture: Secondary | ICD-10-CM | POA: Diagnosis present

## 2016-10-21 DIAGNOSIS — M1990 Unspecified osteoarthritis, unspecified site: Secondary | ICD-10-CM | POA: Diagnosis not present

## 2016-10-21 DIAGNOSIS — S72141K Displaced intertrochanteric fracture of right femur, subsequent encounter for closed fracture with nonunion: Secondary | ICD-10-CM | POA: Diagnosis not present

## 2016-10-21 DIAGNOSIS — W19XXXA Unspecified fall, initial encounter: Secondary | ICD-10-CM | POA: Diagnosis present

## 2016-10-21 DIAGNOSIS — S72141P Displaced intertrochanteric fracture of right femur, subsequent encounter for closed fracture with malunion: Secondary | ICD-10-CM | POA: Diagnosis not present

## 2016-10-21 HISTORY — DX: Adverse effect of unspecified anesthetic, initial encounter: T41.45XA

## 2016-10-21 HISTORY — DX: Family history of other specified conditions: Z84.89

## 2016-10-21 HISTORY — DX: Other complications of anesthesia, initial encounter: T88.59XA

## 2016-10-21 LAB — URINALYSIS, ROUTINE W REFLEX MICROSCOPIC
Bacteria, UA: NONE SEEN
Bilirubin Urine: NEGATIVE
Glucose, UA: NEGATIVE mg/dL
Ketones, ur: NEGATIVE mg/dL
Nitrite: NEGATIVE
PH: 5 (ref 5.0–8.0)
Protein, ur: NEGATIVE mg/dL
RBC / HPF: NONE SEEN RBC/hpf (ref 0–5)
SPECIFIC GRAVITY, URINE: 1.018 (ref 1.005–1.030)
SQUAMOUS EPITHELIAL / LPF: NONE SEEN

## 2016-10-21 LAB — CBC
HCT: 36.2 % (ref 35.0–47.0)
Hemoglobin: 12.1 g/dL (ref 12.0–16.0)
MCH: 31.2 pg (ref 26.0–34.0)
MCHC: 33.5 g/dL (ref 32.0–36.0)
MCV: 93.2 fL (ref 80.0–100.0)
PLATELETS: 244 10*3/uL (ref 150–440)
RBC: 3.89 MIL/uL (ref 3.80–5.20)
RDW: 14.7 % — ABNORMAL HIGH (ref 11.5–14.5)
WBC: 6.9 10*3/uL (ref 3.6–11.0)

## 2016-10-21 LAB — PROTIME-INR
INR: 1
PROTHROMBIN TIME: 13.2 s (ref 11.4–15.2)

## 2016-10-21 LAB — BASIC METABOLIC PANEL
Anion gap: 7 (ref 5–15)
BUN: 19 mg/dL (ref 6–20)
CHLORIDE: 102 mmol/L (ref 101–111)
CO2: 28 mmol/L (ref 22–32)
CREATININE: 0.87 mg/dL (ref 0.44–1.00)
Calcium: 8.8 mg/dL — ABNORMAL LOW (ref 8.9–10.3)
GFR calc Af Amer: >60 mL/min (ref >60)
GFR calc non Af Amer: 55 mL/min — ABNORMAL LOW (ref >60)
Glucose, Bld: 109 mg/dL — ABNORMAL HIGH (ref 65–99)
Potassium: 3.6 mmol/L (ref 3.5–5.1)
SODIUM: 137 mmol/L (ref 135–145)

## 2016-10-21 LAB — SURGICAL PCR SCREEN
MRSA, PCR: NEGATIVE
STAPHYLOCOCCUS AUREUS: NEGATIVE

## 2016-10-21 LAB — SEDIMENTATION RATE: SED RATE: 41 mm/h — AB (ref 0–30)

## 2016-10-21 LAB — C-REACTIVE PROTEIN

## 2016-10-21 NOTE — Patient Instructions (Signed)
Your procedure is scheduled on: Thursday 10/24/16 Report to [place]. 2ND FLOOR MEDICAL MALL ENTRANCE. To find out your arrival time please call [phone] between 1PM - 3PM on Wednesday 10/23/16.  Remember: Instructions that are not followed completely may result in serious medical risk, up to and including death, or upon the discretion of your surgeon and anesthesiologist your surgery may need to be rescheduled.    __X__ 1. Do not eat food or drink liquids after midnight. No gum chewing or hard candies.     __X__ 2. No Alcohol for 24 hours before or after surgery.   ____ 3. Bring all medications with you on the day of surgery if instructed.    __X__ 4. Notify your doctor if there is any change in your medical condition     (cold, fever, infections).             ___X__5. No smoking within 24 hours of your surgery.     Do not wear jewelry, make-up, hairpins, clips or nail polish.  Do not wear lotions, powders, or perfumes.   Do not shave 48 hours prior to surgery. Men may shave face and neck.  Do not bring valuables to the hospital.    [facility] is not responsible for any belongings or valuables.               Contacts, dentures or bridgework may not be worn into surgery.  Leave your suitcase in the car. After surgery it may be brought to your room.  For patients admitted to the hospital, discharge time is determined by your                treatment team.   Patients discharged the day of surgery will not be allowed to drive home.   Please read over the following fact sheets that you were given:   MRSA Information   __X__ Take these medicines the morning of surgery with A SIP OF WATER:    1. LEVOTHYROXINE  2. PANTOPRAZOLE  3.   4.  5.  6.  ____ Fleet Enema (as directed)   __X__ Use CHG Soap as directed  ____ Use inhalers on the day of surgery  ____ Stop metformin 2 days prior to surgery    ____ Take 1/2 of usual insulin dose the night before surgery and none on the  morning of surgery.   ____ Stop Coumadin/Plavix/aspirin on   __X__ Stop Anti-inflammatories such as Advil, Aleve, Ibuprofen, Motrin, Naproxen, Naprosyn, Goodies,powder, or aspirin products.  OK to take Tylenol.   ____ Stop supplements until after surgery.    ____ Bring C-Pap to the hospital.

## 2016-10-24 ENCOUNTER — Inpatient Hospital Stay: Payer: Medicare HMO

## 2016-10-24 ENCOUNTER — Inpatient Hospital Stay: Payer: Medicare HMO | Admitting: Registered Nurse

## 2016-10-24 ENCOUNTER — Encounter: Payer: Self-pay | Admitting: *Deleted

## 2016-10-24 ENCOUNTER — Encounter
Admission: RE | Disposition: A | Discharge status: Home Health | Payer: Self-pay | Source: Ambulatory Visit | Attending: Surgery

## 2016-10-24 ENCOUNTER — Inpatient Hospital Stay
Admission: RE | Admit: 2016-10-24 | Discharge: 2016-10-27 | DRG: 470 | Disposition: A | Discharge status: Home Health | Payer: Medicare HMO | Source: Ambulatory Visit | Attending: Surgery | Admitting: Surgery

## 2016-10-24 DIAGNOSIS — M810 Age-related osteoporosis without current pathological fracture: Secondary | ICD-10-CM | POA: Diagnosis present

## 2016-10-24 DIAGNOSIS — D500 Iron deficiency anemia secondary to blood loss (chronic): Secondary | ICD-10-CM | POA: Diagnosis present

## 2016-10-24 DIAGNOSIS — E039 Hypothyroidism, unspecified: Secondary | ICD-10-CM | POA: Diagnosis present

## 2016-10-24 DIAGNOSIS — Z96649 Presence of unspecified artificial hip joint: Secondary | ICD-10-CM

## 2016-10-24 DIAGNOSIS — D62 Acute posthemorrhagic anemia: Secondary | ICD-10-CM | POA: Diagnosis not present

## 2016-10-24 DIAGNOSIS — M1611 Unilateral primary osteoarthritis, right hip: Secondary | ICD-10-CM | POA: Diagnosis present

## 2016-10-24 DIAGNOSIS — M1651 Unilateral post-traumatic osteoarthritis, right hip: Secondary | ICD-10-CM | POA: Diagnosis not present

## 2016-10-24 DIAGNOSIS — W19XXXA Unspecified fall, initial encounter: Secondary | ICD-10-CM | POA: Diagnosis present

## 2016-10-24 DIAGNOSIS — L8990 Pressure ulcer of unspecified site, unspecified stage: Secondary | ICD-10-CM | POA: Insufficient documentation

## 2016-10-24 DIAGNOSIS — Z85038 Personal history of other malignant neoplasm of large intestine: Secondary | ICD-10-CM

## 2016-10-24 DIAGNOSIS — S72141K Displaced intertrochanteric fracture of right femur, subsequent encounter for closed fracture with nonunion: Secondary | ICD-10-CM | POA: Diagnosis not present

## 2016-10-24 DIAGNOSIS — M25551 Pain in right hip: Secondary | ICD-10-CM | POA: Diagnosis present

## 2016-10-24 DIAGNOSIS — S72141A Displaced intertrochanteric fracture of right femur, initial encounter for closed fracture: Secondary | ICD-10-CM | POA: Diagnosis present

## 2016-10-24 HISTORY — PX: TOTAL HIP REVISION: SHX763

## 2016-10-24 LAB — PREPARE RBC (CROSSMATCH)

## 2016-10-24 SURGERY — TOTAL HIP REVISION
Anesthesia: Spinal | Laterality: Right

## 2016-10-24 MED ORDER — METOCLOPRAMIDE HCL 10 MG PO TABS: 5.0000 mg | ORAL_TABLET | Freq: Three times a day (TID) | ORAL | Status: DC | PRN

## 2016-10-24 MED ORDER — BUPIVACAINE HCL (PF) 0.5 % IJ SOLN
INTRAMUSCULAR | Status: DC | PRN
  Administered 2016-10-24: 2 mL

## 2016-10-24 MED ORDER — DEXAMETHASONE SODIUM PHOSPHATE 10 MG/ML IJ SOLN
INTRAMUSCULAR | Status: AC
  Filled 2016-10-24: qty 1

## 2016-10-24 MED ORDER — ACETAMINOPHEN 10 MG/ML IV SOLN
INTRAVENOUS | Status: DC | PRN
  Administered 2016-10-24: 1000 mg via INTRAVENOUS

## 2016-10-24 MED ORDER — SUGAMMADEX SODIUM 200 MG/2ML IV SOLN
INTRAVENOUS | Status: AC
  Filled 2016-10-24: qty 2

## 2016-10-24 MED ORDER — TRANEXAMIC ACID 1000 MG/10ML IV SOLN
INTRAVENOUS | Status: AC
  Filled 2016-10-24: qty 10

## 2016-10-24 MED ORDER — ROCURONIUM BROMIDE 50 MG/5ML IV SOLN
INTRAVENOUS | Status: AC
  Filled 2016-10-24: qty 1

## 2016-10-24 MED ORDER — DEXAMETHASONE SODIUM PHOSPHATE 10 MG/ML IJ SOLN
INTRAMUSCULAR | Status: DC | PRN
  Administered 2016-10-24: 5 mg via INTRAVENOUS

## 2016-10-24 MED ORDER — NEOMYCIN-POLYMYXIN B GU 40-200000 IR SOLN
Status: AC
  Filled 2016-10-24: qty 4

## 2016-10-24 MED ORDER — ACETAMINOPHEN 500 MG PO TABS
1000.0000 mg | ORAL_TABLET | Freq: Four times a day (QID) | ORAL | Status: AC
  Administered 2016-10-25 (×2): 1000 mg via ORAL
  Filled 2016-10-24 (×2): qty 2

## 2016-10-24 MED ORDER — DEXTROSE-NACL 5-0.9 % IV SOLN
INTRAVENOUS | Status: DC
  Administered 2016-10-24: 22:00:00 via INTRAVENOUS

## 2016-10-24 MED ORDER — PROPOFOL 10 MG/ML IV BOLUS
INTRAVENOUS | Status: DC | PRN
  Administered 2016-10-24: 50 mg via INTRAVENOUS

## 2016-10-24 MED ORDER — ONDANSETRON HCL 4 MG/2ML IJ SOLN
4.0000 mg | Freq: Once | INTRAMUSCULAR | Status: AC | PRN
  Administered 2016-10-24: 4 mg via INTRAVENOUS

## 2016-10-24 MED ORDER — CEFAZOLIN SODIUM-DEXTROSE 2-4 GM/100ML-% IV SOLN
2.0000 g | Freq: Once | INTRAVENOUS | Status: AC
  Administered 2016-10-24 (×2): 2 g via INTRAVENOUS

## 2016-10-24 MED ORDER — SODIUM CHLORIDE 0.9 % IJ SOLN
INTRAMUSCULAR | Status: AC
  Filled 2016-10-24: qty 50

## 2016-10-24 MED ORDER — FENTANYL CITRATE (PF) 100 MCG/2ML IJ SOLN
INTRAMUSCULAR | Status: AC
  Filled 2016-10-24: qty 2

## 2016-10-24 MED ORDER — BUPIVACAINE-EPINEPHRINE (PF) 0.5% -1:200000 IJ SOLN
INTRAMUSCULAR | Status: DC | PRN
  Administered 2016-10-24: 30 mL via PERINEURAL

## 2016-10-24 MED ORDER — NEOMYCIN-POLYMYXIN B GU 40-200000 IR SOLN
Status: AC
  Filled 2016-10-24: qty 20

## 2016-10-24 MED ORDER — ONDANSETRON HCL 4 MG/2ML IJ SOLN
INTRAMUSCULAR | Status: AC
  Filled 2016-10-24: qty 2

## 2016-10-24 MED ORDER — LEVOTHYROXINE SODIUM 100 MCG PO TABS
100.0000 ug | ORAL_TABLET | Freq: Every day | ORAL | Status: DC
  Administered 2016-10-25 – 2016-10-27 (×2): 100 ug via ORAL
  Filled 2016-10-24 (×3): qty 1

## 2016-10-24 MED ORDER — FENTANYL CITRATE (PF) 100 MCG/2ML IJ SOLN
INTRAMUSCULAR | Status: DC | PRN
  Administered 2016-10-24 (×3): 50 ug via INTRAVENOUS

## 2016-10-24 MED ORDER — ONDANSETRON HCL 4 MG/2ML IJ SOLN
INTRAMUSCULAR | Status: AC
  Administered 2016-10-24: 4 mg via INTRAVENOUS
  Filled 2016-10-24: qty 2

## 2016-10-24 MED ORDER — LIDOCAINE HCL (CARDIAC) 20 MG/ML IV SOLN
INTRAVENOUS | Status: DC | PRN
  Administered 2016-10-24: 40 mg via INTRAVENOUS

## 2016-10-24 MED ORDER — BACITRACIN-NEOMYCIN-POLYMYXIN 400-5-5000 EX OINT
TOPICAL_OINTMENT | CUTANEOUS | Status: AC
  Filled 2016-10-24: qty 4

## 2016-10-24 MED ORDER — ROCURONIUM BROMIDE 100 MG/10ML IV SOLN
INTRAVENOUS | Status: DC | PRN
  Administered 2016-10-24: 20 mg via INTRAVENOUS
  Administered 2016-10-24 (×3): 10 mg via INTRAVENOUS

## 2016-10-24 MED ORDER — SUCCINYLCHOLINE CHLORIDE 20 MG/ML IJ SOLN
INTRAMUSCULAR | Status: DC | PRN
  Administered 2016-10-24: 60 mg via INTRAVENOUS

## 2016-10-24 MED ORDER — FENTANYL CITRATE (PF) 100 MCG/2ML IJ SOLN
25.0000 ug | INTRAMUSCULAR | Status: AC | PRN
Start: 2016-10-24 — End: 2016-10-24
  Administered 2016-10-24 (×6): 25 ug via INTRAVENOUS

## 2016-10-24 MED ORDER — RALOXIFENE HCL 60 MG PO TABS
60.0000 mg | ORAL_TABLET | Freq: Every day | ORAL | Status: DC
  Administered 2016-10-25 – 2016-10-26 (×2): 60 mg via ORAL
  Filled 2016-10-24 (×3): qty 1

## 2016-10-24 MED ORDER — EPHEDRINE SULFATE 50 MG/ML IJ SOLN
INTRAMUSCULAR | Status: DC | PRN
  Administered 2016-10-24 (×3): 5 mg via INTRAVENOUS
  Administered 2016-10-24: 10 mg via INTRAVENOUS
  Administered 2016-10-24: 5 mg via INTRAVENOUS

## 2016-10-24 MED ORDER — MORPHINE SULFATE (PF) 2 MG/ML IV SOLN: 1.0000 mg | INTRAVENOUS | Status: DC | PRN

## 2016-10-24 MED ORDER — PANTOPRAZOLE SODIUM 40 MG PO TBEC
40.0000 mg | DELAYED_RELEASE_TABLET | Freq: Two times a day (BID) | ORAL | Status: DC
  Administered 2016-10-25 – 2016-10-27 (×5): 40 mg via ORAL
  Filled 2016-10-24 (×5): qty 1

## 2016-10-24 MED ORDER — FENTANYL CITRATE (PF) 100 MCG/2ML IJ SOLN
INTRAMUSCULAR | Status: AC
  Administered 2016-10-24: 25 ug via INTRAVENOUS
  Filled 2016-10-24: qty 2

## 2016-10-24 MED ORDER — DIPHENHYDRAMINE HCL 12.5 MG/5ML PO ELIX: 12.5000 mg | ORAL_SOLUTION | ORAL | Status: DC | PRN

## 2016-10-24 MED ORDER — ACETAMINOPHEN 650 MG RE SUPP: 650.0000 mg | Freq: Four times a day (QID) | RECTAL | Status: DC | PRN

## 2016-10-24 MED ORDER — MIDAZOLAM HCL 2 MG/2ML IJ SOLN
INTRAMUSCULAR | Status: AC
  Filled 2016-10-24: qty 2

## 2016-10-24 MED ORDER — SODIUM CHLORIDE 0.9 % IV SOLN: Freq: Once | INTRAVENOUS | Status: DC

## 2016-10-24 MED ORDER — METOCLOPRAMIDE HCL 5 MG/ML IJ SOLN
5.0000 mg | Freq: Three times a day (TID) | INTRAMUSCULAR | Status: DC | PRN
  Administered 2016-10-25: 10 mg via INTRAVENOUS
  Filled 2016-10-24: qty 2

## 2016-10-24 MED ORDER — PROMETHAZINE HCL 25 MG/ML IJ SOLN: 25.0000 mg | Freq: Four times a day (QID) | INTRAMUSCULAR | Status: DC | PRN

## 2016-10-24 MED ORDER — ENOXAPARIN SODIUM 40 MG/0.4ML ~~LOC~~ SOLN
40.0000 mg | SUBCUTANEOUS | Status: DC
  Administered 2016-10-25: 40 mg via SUBCUTANEOUS
  Filled 2016-10-24: qty 0.4

## 2016-10-24 MED ORDER — POLYVINYL ALCOHOL 1.4 % OP SOLN
1.0000 [drp] | Freq: Three times a day (TID) | OPHTHALMIC | Status: DC | PRN
  Filled 2016-10-24: qty 15

## 2016-10-24 MED ORDER — SODIUM CHLORIDE 0.9 % IV SOLN
INTRAVENOUS | Status: DC | PRN
  Administered 2016-10-24: 40 mL

## 2016-10-24 MED ORDER — LACTATED RINGERS IV SOLN
INTRAVENOUS | Status: DC
  Administered 2016-10-24: 16:00:00 via INTRAVENOUS
  Administered 2016-10-24: 75 mL/h via INTRAVENOUS
  Administered 2016-10-24: 18:00:00 via INTRAVENOUS

## 2016-10-24 MED ORDER — SUGAMMADEX SODIUM 200 MG/2ML IV SOLN
INTRAVENOUS | Status: DC | PRN
  Administered 2016-10-24: 110 mg via INTRAVENOUS

## 2016-10-24 MED ORDER — ONDANSETRON HCL 4 MG/2ML IJ SOLN
INTRAMUSCULAR | Status: DC | PRN
  Administered 2016-10-24: 4 mg via INTRAVENOUS

## 2016-10-24 MED ORDER — BISACODYL 10 MG RE SUPP
10.0000 mg | Freq: Every day | RECTAL | Status: DC | PRN
  Filled 2016-10-24: qty 1

## 2016-10-24 MED ORDER — TRANEXAMIC ACID 1000 MG/10ML IV SOLN
INTRAVENOUS | Status: AC | PRN
  Administered 2016-10-24: 1000 mg via TOPICAL

## 2016-10-24 MED ORDER — NEOMYCIN-POLYMYXIN B GU 40-200000 IR SOLN
Status: DC | PRN
  Administered 2016-10-24: 14 mL

## 2016-10-24 MED ORDER — TETRACAINE HCL 1 % IJ SOLN
INTRAMUSCULAR | Status: AC
  Filled 2016-10-24: qty 2

## 2016-10-24 MED ORDER — OXYCODONE HCL 5 MG PO TABS: 5.0000 mg | ORAL_TABLET | ORAL | Status: DC | PRN

## 2016-10-24 MED ORDER — PHENYLEPHRINE HCL 10 MG/ML IJ SOLN
INTRAMUSCULAR | Status: DC | PRN
  Administered 2016-10-24: 50 ug via INTRAVENOUS
  Administered 2016-10-24: 100 ug via INTRAVENOUS

## 2016-10-24 MED ORDER — PHENYLEPHRINE HCL 10 MG/ML IJ SOLN
INTRAMUSCULAR | Status: DC | PRN
  Administered 2016-10-24: 30 ug/min via INTRAVENOUS

## 2016-10-24 MED ORDER — TETRACAINE HCL 1 % IJ SOLN
INTRAMUSCULAR | Status: DC | PRN
  Administered 2016-10-24: 1 mg via INTRASPINAL

## 2016-10-24 MED ORDER — MAGNESIUM HYDROXIDE 400 MG/5ML PO SUSP
30.0000 mL | Freq: Every day | ORAL | Status: DC | PRN
  Administered 2016-10-26: 30 mL via ORAL
  Filled 2016-10-24: qty 30

## 2016-10-24 MED ORDER — ACETAMINOPHEN 325 MG PO TABS
650.0000 mg | ORAL_TABLET | Freq: Four times a day (QID) | ORAL | Status: DC | PRN
  Administered 2016-10-26 (×2): 650 mg via ORAL
  Filled 2016-10-24 (×2): qty 2

## 2016-10-24 MED ORDER — MIDAZOLAM HCL 5 MG/5ML IJ SOLN
INTRAMUSCULAR | Status: DC | PRN
  Administered 2016-10-24: 1 mg via INTRAVENOUS

## 2016-10-24 MED ORDER — LIDOCAINE HCL (PF) 2 % IJ SOLN
INTRAMUSCULAR | Status: AC
  Filled 2016-10-24: qty 2

## 2016-10-24 MED ORDER — CEFAZOLIN SODIUM-DEXTROSE 2-4 GM/100ML-% IV SOLN
2.0000 g | Freq: Four times a day (QID) | INTRAVENOUS | Status: AC
  Administered 2016-10-25 (×3): 2 g via INTRAVENOUS
  Filled 2016-10-24 (×3): qty 100

## 2016-10-24 MED ORDER — PANTOPRAZOLE SODIUM 40 MG IV SOLR
40.0000 mg | Freq: Once | INTRAVENOUS | Status: AC
  Administered 2016-10-25: 40 mg via INTRAVENOUS
  Filled 2016-10-24: qty 40

## 2016-10-24 MED ORDER — HYDROCHLOROTHIAZIDE 25 MG PO TABS
12.5000 mg | ORAL_TABLET | Freq: Every day | ORAL | Status: DC
  Filled 2016-10-24 (×4): qty 1

## 2016-10-24 MED ORDER — VITAMIN D 1000 UNITS PO TABS
1000.0000 [IU] | ORAL_TABLET | ORAL | Status: DC
  Administered 2016-10-25 – 2016-10-27 (×2): 1000 [IU] via ORAL
  Filled 2016-10-24 (×4): qty 1

## 2016-10-24 MED ORDER — VITAMIN D 1000 UNITS PO TABS
2000.0000 [IU] | ORAL_TABLET | ORAL | Status: DC
  Administered 2016-10-26: 2000 [IU] via ORAL
  Filled 2016-10-24 (×3): qty 2

## 2016-10-24 MED ORDER — ONDANSETRON HCL 4 MG/2ML IJ SOLN
4.0000 mg | Freq: Four times a day (QID) | INTRAMUSCULAR | Status: DC | PRN
  Administered 2016-10-24: 4 mg via INTRAVENOUS
  Filled 2016-10-24: qty 2

## 2016-10-24 MED ORDER — DOCUSATE SODIUM 100 MG PO CAPS
100.0000 mg | ORAL_CAPSULE | Freq: Two times a day (BID) | ORAL | Status: DC
  Administered 2016-10-25 – 2016-10-27 (×5): 100 mg via ORAL
  Filled 2016-10-24 (×5): qty 1

## 2016-10-24 MED ORDER — BUPIVACAINE LIPOSOME 1.3 % IJ SUSP
INTRAMUSCULAR | Status: AC
  Filled 2016-10-24: qty 20

## 2016-10-24 MED ORDER — BUPIVACAINE-EPINEPHRINE (PF) 0.5% -1:200000 IJ SOLN
INTRAMUSCULAR | Status: AC
  Filled 2016-10-24: qty 30

## 2016-10-24 MED ORDER — ACETAMINOPHEN 10 MG/ML IV SOLN
INTRAVENOUS | Status: AC
  Filled 2016-10-24: qty 100

## 2016-10-24 MED ORDER — CALCIUM CARBONATE ANTACID 500 MG PO CHEW
500.0000 mg | CHEWABLE_TABLET | Freq: Two times a day (BID) | ORAL | Status: DC
  Administered 2016-10-26 (×2): 500 mg via ORAL
  Filled 2016-10-24 (×4): qty 3

## 2016-10-24 MED ORDER — ONDANSETRON HCL 4 MG PO TABS: 4.0000 mg | ORAL_TABLET | Freq: Four times a day (QID) | ORAL | Status: DC | PRN

## 2016-10-24 MED ORDER — FLEET ENEMA 7-19 GM/118ML RE ENEM: 1.0000 | ENEMA | Freq: Once | RECTAL | Status: DC | PRN

## 2016-10-24 MED ORDER — PROPOFOL 500 MG/50ML IV EMUL
INTRAVENOUS | Status: AC
  Filled 2016-10-24: qty 50

## 2016-10-24 MED ORDER — PHENYLEPHRINE HCL 10 MG/ML IJ SOLN
INTRAMUSCULAR | Status: AC
  Filled 2016-10-24: qty 1

## 2016-10-24 MED ORDER — CEFAZOLIN SODIUM-DEXTROSE 2-4 GM/100ML-% IV SOLN
INTRAVENOUS | Status: AC
  Filled 2016-10-24: qty 100

## 2016-10-24 SURGICAL SUPPLY — 93 items
BAG DECANTER FOR FLEXI CONT (MISCELLANEOUS) IMPLANT
BEARING DUAL 28X40 E HIP (Hips) ×2 IMPLANT
BEARING DUAL 28X40MM E HIP (Hips) ×1 IMPLANT
BIT DRILL RINGLOC 3.2MMX20 (BIT) ×1 IMPLANT
BIT DRILL RINGLOC 3.2X20 (BIT) ×1
BIT DRILL RINGLOC QUICK CONN (BIT) ×1 IMPLANT
BLADE SAGITTAL WIDE XTHICK NO (BLADE) IMPLANT
BLADE SURG SZ20 CARB STEEL (BLADE) ×3 IMPLANT
BNDG COHESIVE 6X5 TAN STRL LF (GAUZE/BANDAGES/DRESSINGS) ×3 IMPLANT
CANISTER SUCT 1200ML W/VALVE (MISCELLANEOUS) ×3 IMPLANT
CANISTER SUCT 3000ML PPV (MISCELLANEOUS) ×3 IMPLANT
CATH TRAY METER 16FR LF (MISCELLANEOUS) ×3 IMPLANT
CHLORAPREP W/TINT 26ML (MISCELLANEOUS) ×6 IMPLANT
CNTNR SPEC 2.5X3XGRAD LEK (MISCELLANEOUS) ×1
CONT SPEC 4OZ STER OR WHT (MISCELLANEOUS) ×2
CONTAINER SPEC 2.5X3XGRAD LEK (MISCELLANEOUS) ×1 IMPLANT
COVER MAYO STAND STRL (DRAPES) ×3 IMPLANT
DRAPE IMP U-DRAPE 54X76 (DRAPES) ×6 IMPLANT
DRAPE INCISE IOBAN 66X60 STRL (DRAPES) ×3 IMPLANT
DRAPE SHEET LG 3/4 BI-LAMINATE (DRAPES) ×3 IMPLANT
DRAPE SURG 17X11 SM STRL (DRAPES) ×6 IMPLANT
DRAPE TABLE BACK 80X90 (DRAPES) ×3 IMPLANT
DRILL BIT RINGLOC 3.2MMX20 (BIT) ×2
DRILL BIT RINGLOC QUICK CONN (BIT) ×2
DRSG OPSITE POSTOP 3X4 (GAUZE/BANDAGES/DRESSINGS) ×3 IMPLANT
DRSG OPSITE POSTOP 4X10 (GAUZE/BANDAGES/DRESSINGS) ×3 IMPLANT
DRSG OPSITE POSTOP 4X12 (GAUZE/BANDAGES/DRESSINGS) ×6 IMPLANT
DRSG OPSITE POSTOP 4X14 (GAUZE/BANDAGES/DRESSINGS) IMPLANT
ELECT BLADE 6.5 EXT (BLADE) ×3 IMPLANT
ELECT CAUTERY BLADE 6.4 (BLADE) ×3 IMPLANT
GAUZE PACK 2X3YD (MISCELLANEOUS) ×3 IMPLANT
GAUZE PETRO XEROFOAM 1X8 (MISCELLANEOUS) ×6 IMPLANT
GLOVE BIO SURGEON STRL SZ7.5 (GLOVE) ×15 IMPLANT
GLOVE BIO SURGEON STRL SZ8 (GLOVE) ×15 IMPLANT
GLOVE BIOGEL PI IND STRL 8 (GLOVE) ×2 IMPLANT
GLOVE BIOGEL PI INDICATOR 8 (GLOVE) ×4
GLOVE INDICATOR 8.0 STRL GRN (GLOVE) ×3 IMPLANT
GLOVE SURG SYN 6.5 ES PF (GLOVE) ×6 IMPLANT
GOWN STRL REUS W/ TWL LRG LVL3 (GOWN DISPOSABLE) ×3 IMPLANT
GOWN STRL REUS W/ TWL XL LVL3 (GOWN DISPOSABLE) ×1 IMPLANT
GOWN STRL REUS W/TWL LRG LVL3 (GOWN DISPOSABLE) ×6
GOWN STRL REUS W/TWL XL LVL3 (GOWN DISPOSABLE) ×2
GUIDEPIN VERSANAIL DSP 3.2X444 (ORTHOPEDIC DISPOSABLE SUPPLIES) ×3 IMPLANT
HANDLE YANKAUER SUCT BULB TIP (MISCELLANEOUS) ×3 IMPLANT
HEAD CERAMIC DELTA 28MM 16/18 (Head) ×3 IMPLANT
HEAD HUMERAL ARCOS 16M 210M (Head) ×1 IMPLANT
HIP ACETABULAR SCREW 6.5X20MM (Hips) ×3 IMPLANT
HIP ACETABULAR SCREW 6.5X25MM (Hips) ×3 IMPLANT
HIP SLEEVE BIOLOX -6MM OFFSET (Sleeve) ×3 IMPLANT
HOOD PEEL AWAY FLYTE STAYCOOL (MISCELLANEOUS) ×12 IMPLANT
HUMERAL HEAD ARCOS 16M 210M (Head) ×3 IMPLANT
IV NS 100ML SINGLE PACK (IV SOLUTION) IMPLANT
KIT RM TURNOVER STRD PROC AR (KITS) ×3 IMPLANT
LINER DUAL MOBIL G7 42 E (Liner) ×1 IMPLANT
LINER DUAL MOBILITY G7 42  E (Liner) ×2 IMPLANT
NDL SAFETY 18GX1.5 (NEEDLE) ×3 IMPLANT
NEEDLE FILTER BLUNT 18X 1/2SAF (NEEDLE) ×2
NEEDLE FILTER BLUNT 18X1 1/2 (NEEDLE) ×1 IMPLANT
NEEDLE SPNL 20GX3.5 QUINCKE YW (NEEDLE) ×3 IMPLANT
NEEDLE SPNL 22GX3.5 QUINCKE BK (NEEDLE) ×3 IMPLANT
NS IRRIG 1000ML POUR BTL (IV SOLUTION) ×3 IMPLANT
PACK HIP PROSTHESIS (MISCELLANEOUS) ×3 IMPLANT
PILLOW ABDUC SM (MISCELLANEOUS) ×3 IMPLANT
PIN STEINMANN 3/16X9 BAY 6PK (Pin) ×1 IMPLANT
PRESSURIZER CEMENT PROX FEM SM (MISCELLANEOUS) ×3 IMPLANT
PULSAVAC PLUS IRRIG FAN TIP (DISPOSABLE) ×3
SCREW ACETABULAR G7 6.5X30 (Screw) ×3 IMPLANT
SCREW ACETABULAR G7 6.5X40MM (Screw) ×3 IMPLANT
SCREW ACETABULAR HIP 6.5X20MM (Hips) ×1 IMPLANT
SCREW ACETABULAR HIP 6.5X25MM (Hips) ×1 IMPLANT
SHELL ACETAB MULTI HOLE 52MM (Shell) ×3 IMPLANT
SLEEVE HIP BIOLOX -6MM OFFSET (Sleeve) ×1 IMPLANT
SOL .9 NS 3000ML IRR  AL (IV SOLUTION) ×2
SOL .9 NS 3000ML IRR UROMATIC (IV SOLUTION) ×1 IMPLANT
SPONGE LAP 18X18 5 PK (GAUZE/BANDAGES/DRESSINGS) ×3 IMPLANT
SPONGE XRAY 4X4 16PLY STRL (MISCELLANEOUS) ×3 IMPLANT
ST PIN 3/16X9 BAY 6PK (Pin) ×3 IMPLANT
STAPLER SKIN PROX 35W (STAPLE) ×3 IMPLANT
SUT ETHIBOND #5 BRAIDED 30INL (SUTURE) ×3 IMPLANT
SUT ETHIBOND CT1 BRD #0 30IN (SUTURE) IMPLANT
SUT ETHIBOND CT1 BRD 2-0 30IN (SUTURE) IMPLANT
SUT VIC AB 1 CT1 36 (SUTURE) ×9 IMPLANT
SUT VIC AB 1 CTX 27 (SUTURE) ×3 IMPLANT
SUT VIC AB 2-0 CT1 (SUTURE) ×15 IMPLANT
SUT VIC AB 2-0 CT1 27 (SUTURE) ×4
SUT VIC AB 2-0 CT1 TAPERPNT 27 (SUTURE) ×2 IMPLANT
SYR 20CC LL (SYRINGE) ×3 IMPLANT
SYR 30ML LL (SYRINGE) ×6 IMPLANT
SYR TB 1ML 27GX1/2 LL (SYRINGE) ×3 IMPLANT
SYRINGE 10CC LL (SYRINGE) ×3 IMPLANT
TAPE TRANSPORE STRL 2 31045 (GAUZE/BANDAGES/DRESSINGS) ×3 IMPLANT
TIP BRUSH PULSAVAC PLUS 24.33 (MISCELLANEOUS) ×3 IMPLANT
TIP FAN IRRIG PULSAVAC PLUS (DISPOSABLE) ×1 IMPLANT

## 2016-10-24 NOTE — Anesthesia Post-op Follow-up Note (Signed)
Anesthesia QCDR form completed.        

## 2016-10-24 NOTE — Anesthesia Procedure Notes (Signed)
Spinal  Patient location during procedure: OR Start time: 10/24/2016 1:31 PM End time: 10/24/2016 1:58 PM Staffing Anesthesiologist: Martha Clan Resident/CRNA: Hedda Slade Other anesthesia staff: Johnna Acosta Performed: resident/CRNA, other anesthesia staff and anesthesiologist  Preanesthetic Checklist Completed: patient identified, site marked, surgical consent, pre-op evaluation, timeout performed, IV checked, risks and benefits discussed and monitors and equipment checked Spinal Block Patient position: sitting Prep: ChloraPrep Patient monitoring: heart rate, continuous pulse ox, blood pressure and cardiac monitor Approach: midline Location: L4-5 Injection technique: single-shot Needle Needle type: Whitacre and Introducer  Needle gauge: 24 G Needle length: 9 cm Assessment Sensory level: T10 Additional Notes Negative paresthesia. Negative blood return. Positive free-flowing CSF. Expiration date of kit checked and confirmed. Patient tolerated procedure well, without complications.

## 2016-10-24 NOTE — Anesthesia Procedure Notes (Signed)
Procedure Name: Intubation Date/Time: 10/24/2016 2:13 PM Performed by: Hedda Slade Pre-anesthesia Checklist: Patient identified, Patient being monitored, Timeout performed, Emergency Drugs available and Suction available Patient Re-evaluated:Patient Re-evaluated prior to induction Oxygen Delivery Method: Circle system utilized Preoxygenation: Pre-oxygenation with 100% oxygen Induction Type: IV induction Ventilation: Mask ventilation without difficulty Laryngoscope Size: Mac and 3 Grade View: Grade I Tube type: Oral Tube size: 7.0 mm Number of attempts: 1 Airway Equipment and Method: Stylet Placement Confirmation: ETT inserted through vocal cords under direct vision,  positive ETCO2 and breath sounds checked- equal and bilateral Secured at: 21 cm Tube secured with: Tape Dental Injury: Teeth and Oropharynx as per pre-operative assessment

## 2016-10-24 NOTE — Op Note (Signed)
10/24/2016  6:17 PM  Patient:   Christine [name]  Pre-Op Diagnosis:   Nonunion of displaced intertrochanteric fracture, right hip.  Post-Op Diagnosis:   Same.  Procedure:   Revision right total hip arthroplasty with removal of intramedullary nail, right hip.  Surgeon:   Pascal Lux, MD  Assistant:   Cameron Proud, PA-C  Anesthesia:   GET following failed attempted Spinal  Findings:   As above.  Complications:   None  EBL:   1250 cc  Fluids:   2300 cc crystalloid, one unit of packed RBCs  UOP:   250 cc  TT:   None  Drains:   None  Closure:   Staples  Implants:   Biomet press-fit system with a #16 Arcos high offset calcar replacement revision femoral stem, a 52 mm acetabular shell with four acetabular screws, a 42 mm E-sized Dual Mobility acetabular liner, a 42 x 28 mm Dual Mobility E-polyethylene outer diameter shell, and a 28 mm ceramic head with a -6 mm adapter.  Brief Clinical Note:   The patient is a 83 year old female who is now nearly 2 years status post a trochanteric femoral nailing for a displaced intertrochanteric fracture of her right hip. Initially, the patient seemed to be doing well and she was discharged from follow-up. However, about 3-4 weeks ago, the patient began to experience increased pain in her hip. Subsequent x-rays demonstrated that the femoral neck portion of the fracture had not healed and that the lag screw had penetrated the femoral head. She presents at this time for removal of the intramedullary nail and revision right total hip arthroplasty.   Procedure:   The patient was brought into the operating room. After an extended attempt at spinal anesthesia was performed, the patient underwent general endotracheal intubation and anesthesia. The patient was repositioned in the left lateral decubitus position and secured using a lateral hip positioner. The right hip and lower extremity were prepped with ChloroPrep solution before being draped sterilely.  Preoperative antibiotics were administered. A timeout was performed to verify the appropriate surgical site before a standard posterior approach to the hip was made through an approximately 8-9 inch incision. The incision was carried down through the subcutaneous tissues to expose the gluteal fascia and proximal end of the iliotibial band. These structures were split the length of the incision and the Charnley self-retaining hip retractor placed. The lag screw and was identified and exposed first. A guidewire was passed up through it to facilitate placement of the screw driver. Next, the proximal end of the nail was exposed at the tip of the trochanter by carefully dissecting down through the gluteus medius. The locking cap was released to permit removal of the lag screw before the removal bolt was screwed into the proximal end of the nail. Through a separate 2 cm incision, the distal interlocking screw was removed. The slap hammer was then applied to the proximal end of the nail and the nail removed from the femur without incident. The defects in the gluteus medius and vastus lateralis were reapproximated using #1 Vicryl interrupted sutures.  Attention was then directed to the hip itself. The bursal and scar tissues were swept posteriorly to expose the short external rotators. The anterior border of the piriformis tendon was identified and this plane developed down through the capsule to enter the joint. A flap of tissue was elevated off the posterior aspect of the femoral neck and greater trochanter and retracted posteriorly. This flap included the piriformis tendon,  the short external rotators, and the posterior capsule. In addition, some of the gluteal sling was released to permit optimal exposure. Care also was taken to avoid injury to the sciatic nerve. Once adequate exposure was achieved, the hip was dislocated. The femoral head was removed with little difficulty other than release of some soft scar tissue.  The calcar and femoral neck were worn down to the level of the lesser trochanter, so it was elected to proceed with a calcar replacement stem.  Attention was directed to the acetabular side. The labrum was debrided circumferentially before the ligamentum teres was removed using a large curette. A line was drawn on the drapes corresponding to the native version of the acetabulum. This line was used as a guide while the acetabulum was reamed sequentially beginning with a 45 mm reamer and progressing to a 51 mm reamer. This provided excellent circumferential chatter. The 52 mm high-textured acetabulum was positioned and found to fit reasonably well. However, given her age, it was elected to proceed with stabilizing the acetabulum with additional acetabular screws. A total of 4 screws were used to stabilize the acetabulum. In addition, some of the reamings and bone graft harvested from the femoral head were used to fill an acetabular cyst and the acetabular lesion which developed from abrasion by the lag screw. The trial Dual Mobility acetabular shell was impacted into place with care taken to maintain the appropriate version.   Attention was redirected to the femoral side. The canal was reamed sequentially beginning with a #9 reamer and progressing to a 15.5 mm reamer. Care was taken to avoid excessive stress on the greater trochanter. In fact, some of the greater trochanter was removed so as to avoid fracturing it. In addition, the calcar cut was made at the +0 setting. Next, the femoral canal was broached sequentially beginning with a #13 broach and progressing to a #16 broach. Care was taken to be sure that the appropriate version was obtained. Additional trochanter was carefully removed in order to optimize the version for the calcar replacement stem. This was left in place and several trial reductions performed using both a standard and laterally offset neck options, as well as the -6 mm neck length. The  lateral offset calcar stem with the -6 mm neck demonstrated excellent stability both to extension and external rotation as well as with flexion and internal rotation beyond 50. She did have some tightness in extension.   The permanent dual mobility shell was inserted into the acetabulum before the #16 Arcos high offset calcar replacement revision femoral stem was impacted into place with care taken to maintain the appropriate version. A repeat trial reduction was performed using the -6 mm neck length. The -6 mm neck length demonstrated excellent stability both in extension and external rotation as well as with flexion to 90 and internal rotation beyond 50. It also was stable in the position of sleep. In addition, leg lengths appeared to be restored adequately. The 42 x 28 mm dual mobility outer diameter shell was put together with the 28 mm ceramic head and the -6 mm neck adapter construct on the back table before being impacted onto the stem of the femoral component. The Morse taper locking mechanism was verified using manual distraction before the head was relocated and placed through a range of motion with the findings as described above.  The wound was copiously irrigated with bacitracin saline solution via the jet lavage system before the peri-incisional and pericapsular tissues were injected  with 30 cc of 0.5% Sensorcaine with epinephrine and 20 cc of Exparel diluted out to 60 cc with normal saline to help with postoperative analgesia. The posterior flap was reapproximated to the posterior aspect of the greater trochanter using #2 Tycron interrupted sutures placed through drill holes. Several additional #2 Tycron interrupted sutures were used to reinforce this layer of closure. The iliotibial band was reapproximated using #1 Vicryl interrupted sutures before the gluteal fascia was closed using a running #1 Vicryl suture. At this point, 1 g of transexemic acid in 10 cc of normal saline was injected into  the joint to help reduce postoperative bleeding. The subcutaneous tissues were closed in several layers using 2-0 Vicryl interrupted sutures before the skin was closed using staples. The distal wound also was closed using 2-0 Vicryl interrupted sutures for the subcutaneous tissues and staples for the skin. Sterile occlusive dressings were applied to the wounds before the patient was placed into an abduction wedge pillow. The patient was then rolled back into the supine position on her hospital bed before being awakened, extubated, and returned to the recovery room in satisfactory condition after tolerating the procedure well.

## 2016-10-24 NOTE — H&P (Signed)
Paper H&P to be scanned into permanent record. H&P reviewed and patient re-examined. No changes. 

## 2016-10-24 NOTE — Transfer of Care (Signed)
Immediate Anesthesia Transfer of Care Note  Patient: Christine [name]  Procedure(s) Performed: Procedure(s): TOTAL HIP REVISION AND HARDWARE REMOVAL (Right)  Patient Location: PACU  Anesthesia Type:General  Level of Consciousness: awake  Airway & Oxygen Therapy: Patient connected to face mask oxygen  Post-op Assessment: Post -op Vital signs reviewed and stable  Post vital signs: stable  Last Vitals:  Vitals:   10/24/16 1201 10/24/16 1841  BP: (!) 164/51 (!) 139/57  Pulse: 62 92  Resp: 18 15  Temp: 36.7 C   SpO2: 98% 100%    Last Pain:  Vitals:   10/24/16 1201  TempSrc: Oral  PainSc: 2       Patients Stated Pain Goal: 0 (34/06/84 0335)  Complications: No apparent anesthesia complications

## 2016-10-24 NOTE — Anesthesia Preprocedure Evaluation (Signed)
Anesthesia Evaluation  Patient identified by MRN, date of birth, ID band Patient awake    Reviewed: Allergy & Precautions, NPO status , Patient's Chart, lab work & pertinent test results  History of Anesthesia Complications (+) PROLONGED EMERGENCE, Family history of anesthesia reaction and history of anesthetic complications  Airway Mallampati: II       Dental  (+) Chipped   Pulmonary neg pulmonary ROS,           Cardiovascular negative cardio ROS       Neuro/Psych negative neurological ROS     GI/Hepatic negative GI ROS, Neg liver ROS,   Endo/Other  Hypothyroidism   Renal/GU      Musculoskeletal  (+) Arthritis , Osteoarthritis,    Abdominal   Peds  Hematology negative hematology ROS (+)   Anesthesia Other Findings Past Medical History: No date: Cancer ([place])     Comment:  colon No date: Carpal tunnel syndrome No date: Complication of anesthesia     Comment:  hard time waking up after thyroidectomy No date: DJD (degenerative joint disease) No date: Family history of adverse reaction to anesthesia     Comment:  daughter PONV No date: Hearing loss No date: Hypothyroidism No date: Osteoporosis No date: Wears glasses   Reproductive/Obstetrics negative OB ROS                             Anesthesia Physical  Anesthesia Plan  ASA: III  Anesthesia Plan: Spinal   Post-op Pain Management:    Induction:   PONV Risk Score and Plan:   Airway Management Planned: Natural Airway and Simple Face Mask  Additional Equipment:   Intra-op Plan:   Post-operative Plan:   Informed Consent: I have reviewed the patients History and Physical, chart, labs and discussed the procedure including the risks, benefits and alternatives for the proposed anesthesia with the patient or authorized representative who has indicated his/her understanding and acceptance.     Plan Discussed with:  Anesthesiologist, CRNA and Surgeon  Anesthesia Plan Comments:         Anesthesia Quick Evaluation

## 2016-10-24 NOTE — OR Nursing (Signed)
Explanted three components from right femur -  lag screw, cortical screw,and nail .

## 2016-10-25 ENCOUNTER — Encounter
Admission: RE | Admit: 2016-10-25 | Discharge: 2016-10-25 | Disposition: A | Discharge status: Home / Self Care | Payer: Medicare HMO | Source: Ambulatory Visit | Attending: Internal Medicine | Admitting: Internal Medicine

## 2016-10-25 DIAGNOSIS — L8990 Pressure ulcer of unspecified site, unspecified stage: Secondary | ICD-10-CM | POA: Insufficient documentation

## 2016-10-25 LAB — CBC WITH DIFFERENTIAL/PLATELET
Basophils Absolute: 0 10*3/uL (ref 0–0.1)
Basophils Relative: 0 %
EOS ABS: 0 10*3/uL (ref 0–0.7)
Eosinophils Relative: 0 %
HEMATOCRIT: 28.6 % — AB (ref 35.0–47.0)
HEMOGLOBIN: 9.7 g/dL — AB (ref 12.0–16.0)
Lymphocytes Relative: 3 %
Lymphs Abs: 0.5 10*3/uL — ABNORMAL LOW (ref 1.0–3.6)
MCH: 31 pg (ref 26.0–34.0)
MCHC: 33.9 g/dL (ref 32.0–36.0)
MCV: 91.4 fL (ref 80.0–100.0)
MONO ABS: 1 10*3/uL — AB (ref 0.2–0.9)
MONOS PCT: 6 %
NEUTROS ABS: 15.6 10*3/uL — AB (ref 1.4–6.5)
NEUTROS PCT: 91 %
Platelets: 183 10*3/uL (ref 150–440)
RBC: 3.14 MIL/uL — ABNORMAL LOW (ref 3.80–5.20)
RDW: 14.8 % — ABNORMAL HIGH (ref 11.5–14.5)
WBC: 17.1 10*3/uL — ABNORMAL HIGH (ref 3.6–11.0)

## 2016-10-25 LAB — BASIC METABOLIC PANEL
Anion gap: 9 (ref 5–15)
BUN: 18 mg/dL (ref 6–20)
CHLORIDE: 105 mmol/L (ref 101–111)
CO2: 23 mmol/L (ref 22–32)
CREATININE: 1 mg/dL (ref 0.44–1.00)
Calcium: 7.5 mg/dL — ABNORMAL LOW (ref 8.9–10.3)
GFR calc Af Amer: 54 mL/min — ABNORMAL LOW (ref >60)
GFR calc non Af Amer: 47 mL/min — ABNORMAL LOW (ref >60)
Glucose, Bld: 209 mg/dL — ABNORMAL HIGH (ref 65–99)
Potassium: 3.9 mmol/L (ref 3.5–5.1)
Sodium: 137 mmol/L (ref 135–145)

## 2016-10-25 LAB — GLUCOSE, CAPILLARY
GLUCOSE-CAPILLARY: 162 mg/dL — AB (ref 65–99)
GLUCOSE-CAPILLARY: 106 mg/dL — AB (ref 65–99)
Glucose-Capillary: 188 mg/dL — ABNORMAL HIGH (ref 65–99)

## 2016-10-25 MED ORDER — TRAMADOL HCL 50 MG PO TABS: 50.0000 mg | ORAL_TABLET | Freq: Four times a day (QID) | ORAL | Status: DC | PRN

## 2016-10-25 MED ORDER — TRAMADOL HCL 50 MG PO TABS: 50.0000 mg | ORAL_TABLET | Freq: Four times a day (QID) | ORAL | 0 refills | Status: DC | PRN

## 2016-10-25 MED ORDER — ENOXAPARIN SODIUM 30 MG/0.3ML ~~LOC~~ SOLN
30.0000 mg | SUBCUTANEOUS | Status: DC
  Administered 2016-10-26 – 2016-10-27 (×2): 30 mg via SUBCUTANEOUS
  Filled 2016-10-25 (×2): qty 0.3

## 2016-10-25 MED ORDER — ENOXAPARIN SODIUM 40 MG/0.4ML ~~LOC~~ SOLN: 40.0000 mg | SUBCUTANEOUS | 0 refills | Status: DC

## 2016-10-25 MED ORDER — OXYCODONE HCL 5 MG PO TABS: 5.0000 mg | ORAL_TABLET | ORAL | 0 refills | Status: DC | PRN

## 2016-10-25 NOTE — Clinical Social Work Placement (Signed)
   CLINICAL SOCIAL WORK PLACEMENT  NOTE  Date:  10/25/2016  Patient Details  Name: FALLON HAECKER MRN: [id number] Date of Birth: [birth year]  Clinical Social Work is seeking post-discharge placement for this patient at the [place] level of care (*CSW will initial, date and re-position this form in  chart as items are completed):  Yes   Patient/family provided with [facility] Work Department's list of facilities offering this level of care within the geographic area requested by the patient (or if unable, by the patient's family).  Yes   Patient/family informed of their freedom to choose among providers that offer the needed level of care, that participate in Medicare, Medicaid or managed care program needed by the patient, have an available bed and are willing to accept the patient.  Yes   Patient/family informed of [facility]'s ownership interest in [facility] and [facility], as well as of the fact that they are under no obligation to receive care at these facilities.  PASRR submitted to EDS on       PASRR number received on       Existing PASRR number confirmed on 10/25/16     FL2 transmitted to all facilities in geographic area requested by pt/family on 10/25/16     FL2 transmitted to all facilities within larger geographic area on       Patient informed that his/her managed care company has contracts with or will negotiate with certain facilities, including the following:        Yes   Patient/family informed of bed offers received.  Patient chooses bed at  [facility] )     Physician recommends and patient chooses bed at      Patient to be transferred to   on  .  Patient to be transferred to facility by       Patient family notified on   of transfer.  Name of family member notified:        PHYSICIAN       Additional Comment:    _______________________________________________ Naylea [place], Veronia Beets, LCSW 10/25/2016, 1:27  PM

## 2016-10-25 NOTE — Progress Notes (Signed)
Physical Therapy Treatment Patient Details Name: Christine [name] MRN: [id number] DOB: [birth year] Today's Date: 10/25/2016    History of Present Illness Pt is a 83 year old female who is now nearly 2 years status post a trochanteric femoral nailing for a displaced intertrochanteric fracture of her right hip. Initially, the patient seemed to be doing well and she was discharged from follow-up. However, about 3-4 weeks ago, the patient began to experience increased pain in her hip. Subsequent x-rays demonstrated that the femoral neck portion of the fracture had not healed and that the lag screw had penetrated the femoral head. Pt is now s/p removal of the intramedullary nail and revision right total hip arthroplasty with posterior approach.  PMH includes: DJD, HOH, hypothyroidism, and osteoporosis.     PT Comments    Pt presents with deficits in strength, transfers, mobility, gait, balance, and activity tolerance but is progressing towards goals.  Pt required Mod A with bed mobility tasks most notably to assist RLE out of bed but did require additional help coming to upright sitting.  Pt was CGA with transfers but did continue to require extensive cues for sequencing to maintain posterior hip precautions.  Pt ambulated 85' with RW and CGA with slow cadence and short B step length but was steady without LOB.  Pt will benefit from PT services in a SNF setting to safely address above deficits for decreased caregiver assistance upon discharge.     Follow Up Recommendations  SNF     Equipment Recommendations  None recommended by PT    Recommendations for Other Services OT consult     Precautions / Restrictions Precautions Precautions: Fall;Posterior Hip Precaution Booklet Issued: Yes (comment) Precaution Comments: Posterior hip precaution education with pt and granddaughter reinforced Restrictions Weight Bearing Restrictions: Yes RLE Weight Bearing: Weight bearing as tolerated    Mobility  Bed Mobility Overal bed mobility: Needs Assistance Bed Mobility: Supine to Sit     Supine to sit: Mod assist     General bed mobility comments: Assistance required for RLE in/out of bed  Transfers Overall transfer level: Needs assistance Equipment used: Rolling walker (2 wheeled) Transfers: Sit to/from Stand Sit to Stand: Min guard         General transfer comment: Max verbal and tactile cues for sequencing to maintain hip precautions  Ambulation/Gait Ambulation/Gait assistance: Min guard Ambulation Distance (Feet): 80 Feet Assistive device: Rolling walker (2 wheeled) Gait Pattern/deviations: Step-through pattern;Decreased step length - left;Trunk flexed   Gait velocity interpretation: Below normal speed for age/gender General Gait Details: Practiced 180 deg R turns x 2 with max verbal and tactile cues for sequencing to prevent closed kinetic chain R hip IR   Stairs Stairs:  (deferred)          Wheelchair Mobility    Modified Rankin (Stroke Patients Only)       Balance Overall balance assessment: Needs assistance Sitting-balance support: Feet supported;Feet unsupported;Bilateral upper extremity supported Sitting balance-Leahy Scale: Fair     Standing balance support: Bilateral upper extremity supported Standing balance-Leahy Scale: Fair                              Cognition Arousal/Alertness: Awake/alert Behavior During Therapy: WFL for tasks assessed/performed Overall Cognitive Status: Within Functional Limits for tasks assessed  Exercises Total Joint Exercises Ankle Circles/Pumps: AROM;Both;10 reps Long Arc Quad: Strengthening;Right;10 reps;15 reps Knee Flexion: Strengthening;Right;10 reps;15 reps Marching in Standing: AROM;Both;10 reps    General Comments        Pertinent Vitals/Pain Pain Assessment: 0-10 Pain Score: 2  Pain Location: R hip Pain Descriptors / Indicators:  Sore Pain Intervention(s): Premedicated before session;Monitored during session    Home Living                      Prior Function            PT Goals (current goals can now be found in the care plan section) Progress towards PT goals: Progressing toward goals    Frequency    BID      PT Plan Current plan remains appropriate    Co-evaluation              AM-PAC PT "6 Clicks" Daily Activity  Outcome Measure                   End of Session Equipment Utilized During Treatment: Gait belt Activity Tolerance: Patient limited by fatigue Patient left: in chair;with chair alarm set;with SCD's reapplied;with call bell/phone within reach;with family/visitor present;Other (comment) (hip abd pillow donned) Nurse Communication: Mobility status PT Visit Diagnosis: Other abnormalities of gait and mobility (R26.89);Muscle weakness (generalized) (M62.81)     Time: 2778-2423 PT Time Calculation (min) (ACUTE ONLY): 29 min  Charges:  $Gait Training: 8-22 mins $Therapeutic Exercise: 8-22 mins                    G Codes:       DRoyetta Asal PT, DPT 10/25/16, 3:19 PM

## 2016-10-25 NOTE — Clinical Social Work Note (Signed)
Clinical Social Work Assessment  Patient Details  Name: Christine [name] MRN: [id number] Date of Birth: September 14, 1920  Date of referral:  10/25/16               Reason for consult:  Facility Placement                Permission sought to share information with:  Chartered certified accountant granted to share information::  Yes, Verbal Permission Granted  Name::      [facility]ine [name]::   [name]   Relationship::     Contact Information:     Housing/Transportation Living arrangements for the past 2 months:  [name] of Information:  Patient, Adult Children Patient Interpreter Needed:  None Criminal Activity/Legal Involvement Pertinent to Current Situation/Hospitalization:  No - Comment as needed Significant Relationships:  Adult Children, Other Family Members Lives with:  Self Do you feel safe going back to the place where you live?  Yes Need for family participation in patient care:  Yes (Comment)  Care giving concerns:  Patient lives alone in [place].    Social Worker assessment / plan:  Holiday representative (CSW) received SNF consult. PT is recommending SNF. CSW met with patient and her great granddaughter Christine [name] and granddaughter Christine [name] were at bedside. Patient was alert and oriented X4 and was laying in the bed. CSW introduced self and explained role of CSW department. Patient reported that she lives at home alone and has lots of family support. Granddaughter Christine [name] reported that they would prefer to take patient home with [facility] however if patient doesn't progress with PT then they want [name]. CSW explained that [name] will have to approve SNF stay. Patient and her family verbalized their understanding. FL2 complete and faxed out.   CSW presented bed offers to patient and family, they chose [facility] Per [name] admissions coordinator at [name] she will start [name] authorization today and she will have a  bed for patient on Monday. Granddaughter Christine [name] works for [employer] and prefers them if patient goes home over the weekend. RN case manager aware of above. CSW will continue to follow and assist as needed.   Employment status:  Retired Nurse, adult PT Recommendations:  [name] / Referral to community resources:  [name]  Patient/Family's Response to care:  Patient and her family prefer home health however they are agreeable to [facility]   Patient/Family's Understanding of and Emotional Response to Diagnosis, Current Treatment, and Prognosis:  Patient and her family were very pleasant and thanked CSW for assistance.   Emotional Assessment Appearance:  Appears stated age Attitude/Demeanor/Rapport:    Affect (typically observed):  Accepting, Adaptable, Pleasant Orientation:  Oriented to Self, Oriented to Place, Oriented to  Time, Oriented to Situation Alcohol / Substance use:  Not Applicable Psych involvement (Current and /or in the community):  No (Comment)  Discharge Needs  Concerns to be addressed:  Discharge Planning Concerns Readmission within the last 30 days:  No Current discharge risk:  Dependent with Mobility Barriers to Discharge:  Continued Medical Work up   [employer], Christine Beets, LCSW 10/25/2016, 1:28 PM

## 2016-10-25 NOTE — Evaluation (Signed)
Physical Therapy Evaluation Patient Details Name: Christine [name] MRN: [id number] DOB: [birth year] Today's Date: 10/25/2016   History of Present Illness  Pt is a 83 year old female who is now nearly 2 years status post a trochanteric femoral nailing for a displaced intertrochanteric fracture of her right hip. Initially, the patient seemed to be doing well and she was discharged from follow-up. However, about 3-4 weeks ago, the patient began to experience increased pain in her hip. Subsequent x-rays demonstrated that the femoral neck portion of the fracture had not healed and that the lag screw had penetrated the femoral head. Pt is now s/p removal of the intramedullary nail and revision right total hip arthroplasty with posterior approach.  PMH includes: DJD, HOH, hypothyroidism, and osteoporosis.     Clinical Impression  Pt presents with deficits in strength, transfers, mobility, gait, balance, and activity tolerance.  Pt required Mod-Max A with bed mobility tasks most notable for RLE in/out of bed.  Pt required Min A with transfers with significant cues in order to maintain hip precautions.  Pt able to amb 6' with RW and Min A with cues for general safety with RW and to maintain hip precautions.  Pt very fatigued after amb and returned to supine with SpO2 and HR WNL.  Pt will benefit from PT services in a SNF setting upon discharge to safely address above deficits for decreased caregiver assistance and return to PLOF.       Follow Up Recommendations SNF    Equipment Recommendations  None recommended by PT    Recommendations for Other Services OT consult     Precautions / Restrictions Precautions Precautions: Fall;Posterior Hip Precaution Booklet Issued: Yes (comment) Precaution Comments: Posterior hip precaution education with pt and granddaughter who is a [place] with [employer] Restrictions Weight Bearing Restrictions: Yes RLE Weight Bearing: Weight bearing as tolerated       Mobility  Bed Mobility Overal bed mobility: Needs Assistance Bed Mobility: Sit to Supine;Supine to Sit     Supine to sit: Mod assist;Max assist Sit to supine: Mod assist;Max assist   General bed mobility comments: Significant assistance required for RLE in/out of bed  Transfers Overall transfer level: Needs assistance Equipment used: Rolling walker (2 wheeled) Transfers: Sit to/from Stand Sit to Stand: Min guard         General transfer comment: Max verbal and tactile cues for sequencing to maintain hip precautions  Ambulation/Gait Ambulation/Gait assistance: Min assist Ambulation Distance (Feet): 40 Feet Assistive device: Rolling walker (2 wheeled) Gait Pattern/deviations: Step-through pattern;Decreased step length - left;Trunk flexed   Gait velocity interpretation: Below normal speed for age/gender General Gait Details: Pt/family education on preventing CKC R hip IR during turns to the R; pt resistant to WB throught RLE upon initial stand but improved during session.    Stairs            Wheelchair Mobility    Modified Rankin (Stroke Patients Only)       Balance Overall balance assessment: Needs assistance Sitting-balance support: Feet supported;Feet unsupported;Bilateral upper extremity supported Sitting balance-Leahy Scale: Fair     Standing balance support: Bilateral upper extremity supported Standing balance-Leahy Scale: Fair                               Pertinent Vitals/Pain Pain Assessment: No/denies pain    Home Living Family/patient expects to be discharged to:: Private residence Living Arrangements: Alone Available Help at Discharge: Family;Available  24 hours/day Type of Home: House Home Access: Stairs to enter Entrance Stairs-Rails: Right Entrance Stairs-Number of Steps: 2 Home Layout: One level Home Equipment: Walker - 2 wheels;Walker - 4 wheels      Prior Function Level of Independence: Independent with assistive  device(s)         Comments: Mod Ind with amb with a RW and in the community with a rollator.  No recent fall history.     Hand Dominance   Dominant Hand: Right    Extremity/Trunk Assessment        Lower Extremity Assessment Lower Extremity Assessment: Generalized weakness       Communication   Communication: HOH  Cognition Arousal/Alertness: Awake/alert Behavior During Therapy: WFL for tasks assessed/performed Overall Cognitive Status: Within Functional Limits for tasks assessed                                        General Comments      Exercises Total Joint Exercises Ankle Circles/Pumps: Both;5 reps;10 reps;Strengthening Quad Sets: Strengthening;Both;10 reps Hip ABduction/ADduction: AAROM;10 reps;Right Straight Leg Raises: AAROM;Right;10 reps Long Arc Quad: AROM;Both;10 reps Knee Flexion: AROM;Both;10 reps Marching in Standing: AROM;Both;10 reps Other Exercises Other Exercises: HEP education with pt/family for APs, QS, and GS x 10 each 5-6x/day   Assessment/Plan    PT Assessment Patient needs continued PT services  PT Problem List Decreased strength;Decreased activity tolerance;Decreased balance;Decreased mobility       PT Treatment Interventions DME instruction;Gait training;Stair training;Functional mobility training;Neuromuscular re-education;Balance training;Therapeutic exercise;Therapeutic activities;Patient/family education    PT Goals (Current goals can be found in the Care Plan section)  Acute Rehab PT Goals Patient Stated Goal: "To get out and work in the yard" PT Goal Formulation: With patient Time For Goal Achievement: 11/07/16 Potential to Achieve Goals: Good    Frequency BID   Barriers to discharge Inaccessible home environment      Co-evaluation               AM-PAC PT "6 Clicks" Daily Activity  Outcome Measure Difficulty turning over in bed (including adjusting bedclothes, sheets and blankets)?:  Total Difficulty moving from lying on back to sitting on the side of the bed? : Total Difficulty sitting down on and standing up from a chair with arms (e.g., wheelchair, bedside commode, etc,.)?: Total Help needed moving to and from a bed to chair (including a wheelchair)?: A Little Help needed walking in hospital room?: A Little Help needed climbing 3-5 steps with a railing? : A Lot 6 Click Score: 11    End of Session Equipment Utilized During Treatment: Gait belt Activity Tolerance: Patient limited by fatigue;Patient limited by pain Patient left: in bed;with bed alarm set;with SCD's reapplied;with family/visitor present;with call bell/phone within reach, abd pillow in place Nurse Communication: Mobility status PT Visit Diagnosis: Other abnormalities of gait and mobility (R26.89);Muscle weakness (generalized) (M62.81)    Time: 4496-7591 PT Time Calculation (min) (ACUTE ONLY): 39 min   Charges:   PT Evaluation $PT Eval Low Complexity: 1 Low PT Treatments $Gait Training: 8-22 mins $Therapeutic Exercise: 8-22 mins   PT G Codes:        DRoyetta Asal PT, DPT 10/25/16, 11:14 AM

## 2016-10-25 NOTE — Discharge Summary (Signed)
Physician Discharge Summary  Patient ID: Christine [name] MRN: [id number] DOB/AGE: [birth year] 83 y.o.  Admit date: 10/24/2016 Discharge date: 10/27/16  Admission Diagnoses:  PRIMARY OSTEOARTHRITIS RIGHT HIP,CLOSED DISPLACED INTERTROCHANTERIC FRACTURE OF RIGHT FEMUR WITHMALUNION Nonunion of displaced intertrochanteric fracture, right hip.  Discharge Diagnoses: Patient Active Problem List   Diagnosis Date Noted  . 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 Hip fracture ([place]) 01/09/2015  Nonunion of displaced intertrochanteric fracture, right hip.  Past Medical History:  Diagnosis Date  . Cancer ([place])    colon  . Carpal tunnel syndrome   . Complication of anesthesia    hard time waking up after thyroidectomy  . DJD (degenerative joint disease)   . Family history of adverse reaction to anesthesia    daughter PONV  . Hearing loss   . Hypothyroidism   . Osteoporosis   . Wears glasses    Transfusion: None.   Consultants (if any):   Discharged Condition: Improved  Hospital Course: Christine [name] is an 83 y.o. female who was admitted 10/24/2016 with a diagnosis of a nonunion of displaced intertrochanteric fracture of the right hip and went to the operating room on 10/24/2016 and underwent the above named procedures.    Surgeries: Procedure(s): TOTAL HIP REVISION AND HARDWARE REMOVAL on 10/24/2016 Patient tolerated the surgery well. Taken to PACU where she was stabilized and then transferred to the orthopedic floor.  Started on Lovenox 40mg  q 24 hrs. Foot pumps applied bilaterally at 80 mm. Heels elevated on bed with rolled towels. No evidence of DVT. Negative Homan. Physical therapy started on day #1 for gait training and transfer. OT started day #1 for ADL and assisted devices.  Patient's IV and foley were d/c on POD1.  Implants: Biomet press-fit system with a #16 Arcos high offset calcar replacement revision femoral stem, a 52 mm acetabular shell with four  acetabular screws, a 42 mm E-sized Dual Mobility acetabular liner, a 42 x 28 mm Dual Mobility E-polyethylene outer diameter shell, and a 28 mm ceramic head with a -6 mm adapter.  She was given perioperative antibiotics:  Anti-infectives    Start     Dose/Rate Route Frequency Ordered Stop   10/25/16 0000  ceFAZolin (ANCEF) IVPB 2g/100 mL premix     2 g 200 mL/hr over 30 Minutes Intravenous Every 6 hours 10/24/16 2019 10/25/16 1759   10/24/16 1126  ceFAZolin (ANCEF) 2-4 GM/100ML-% IVPB    Comments:  Cleatis Polka   : cabinet override      10/24/16 1126 10/24/16 1425   10/24/16 0230  ceFAZolin (ANCEF) IVPB 2g/100 mL premix     2 g 200 mL/hr over 30 Minutes Intravenous  Once 10/24/16 0216 10/24/16 1755    .  She was given sequential compression devices, early ambulation, and Lovenox for DVT prophylaxis.  She benefited maximally from the hospital stay and there were no complications.    Recent vital signs:  Vitals:   10/25/16 0022 10/25/16 0502  BP: (!) 121/48 134/62  Pulse: 84 91  Resp:    Temp: 98.4 F (36.9 C) (!) 97.5 F (36.4 C)  SpO2: 100% 100%    Recent laboratory studies:  Lab Results  Component Value Date   HGB 9.7 (L) 10/25/2016   HGB 12.1 10/21/2016   HGB 9.9 (L) 01/13/2015   Lab Results  Component Value Date   WBC 17.1 (H) 10/25/2016   PLT 183 10/25/2016   Lab Results  Component Value Date   INR  1.00 10/21/2016   Lab Results  Component Value Date   NA 137 10/25/2016   K 3.9 10/25/2016   CL 105 10/25/2016   CO2 23 10/25/2016   BUN 18 10/25/2016   CREATININE 1.00 10/25/2016   GLUCOSE 209 (H) 10/25/2016    Discharge Medications:   Allergies as of 10/25/2016      Reactions   Adhesive [tape] Other (See Comments)   Red itchy      Medication List    TAKE these medications   calcium carbonate 1500 (600 Ca) MG Tabs tablet Commonly known as:  OSCAL Take 600 mg by mouth 2 (two) times daily with a meal.   cholecalciferol 1000 units tablet Commonly  known as:  VITAMIN D Take 1,000 Units by mouth every other day.   KP VITAMIN D3 2000 units Caps Generic drug:  Cholecalciferol Take 2,000 Units by mouth every other day.   enoxaparin 40 MG/0.4ML injection Commonly known as:  LOVENOX Inject 0.4 mLs (40 mg total) into the skin daily.   hydrochlorothiazide 12.5 MG tablet Commonly known as:  HYDRODIURIL Take 12.5 mg by mouth daily as needed. For fluid retention.   levothyroxine 100 MCG tablet Commonly known as:  SYNTHROID, LEVOTHROID Take 100 mcg by mouth daily before breakfast.   LUBRICANT EYE DROPS 0.4-0.3 % Soln Generic drug:  Polyethyl Glycol-Propyl Glycol Place 1 drop into both eyes 3 (three) times daily as needed. For dry eyes.   oxyCODONE 5 MG immediate release tablet Commonly known as:  Oxy IR/ROXICODONE Take 1-2 tablets (5-10 mg total) by mouth every 4 (four) hours as needed for breakthrough pain.   pantoprazole 40 MG tablet Commonly known as:  PROTONIX Take 1 tablet (40 mg total) by mouth 2 (two) times daily. What changed:  when to take this  reasons to take this   raloxifene 60 MG tablet Commonly known as:  EVISTA Take 60 mg by mouth daily with lunch.   traMADol 50 MG tablet Commonly known as:  ULTRAM Take 1 tablet (50 mg total) by mouth every 6 (six) hours as needed.       Diagnostic Studies: Dg Chest 2 View  Result Date: 10/21/2016 CLINICAL DATA:  83 year old female under preoperative evaluation. EXAM: CHEST  2 VIEW COMPARISON:  Chest x-ray 01/09/2015. FINDINGS: Chest x-ray lung volumes are low. No consolidative airspace disease. No pleural effusions. Mild diffuse peribronchial cuffing and slight interstitial prominence, chronic and similar to prior examinations. No acute consolidative airspace disease. No pleural effusions. No evidence of pulmonary edema. Mild cardiomegaly (unchanged). Upper mediastinal contours are within normal limits. Aortic atherosclerosis. Surgical clips at the level of the thoracic  inlet presumably from prior thyroid surgery. IMPRESSION: 1. No radiographic evidence of acute cardiopulmonary disease. 2. Mild cardiomegaly. 3. Aortic atherosclerosis. 4. Mild diffuse peribronchial cuffing and interstitial prominence, similar to prior studies, which could suggest chronic bronchitis. Electronically Signed   By: Vinnie Langton M.D.   On: 10/21/2016 17:00   Dg Hip Unilat With Pelvis 1v Right  Result Date: 10/24/2016 CLINICAL DATA:  Status post right femoral hardware removal right hip arthroplasty. EXAM: DG HIP (WITH OR WITHOUT PELVIS) 1V RIGHT COMPARISON:  Prior intraoperative images on 01/09/2015 FINDINGS: A right total hip arthroplasty demonstrates normal alignment. There was difficulty in positioning the patient and the superior aspect of the acetabular screws are not visible in the frontal projection. IMPRESSION: Normal alignment of right total hip arthroplasty. Electronically Signed   By: Aletta Edouard M.D.   On: 10/24/2016 21:28  Disposition: Plan will be for discharge to SNF on Sunday pending progress with physical therapy.  Follow-up Information    Lattie Corns, PA-C Follow up in 14 day(s).   Specialty:  Physician Assistant Why:  [name] information: [place] [address] [phone]          Signed: Judson Roch PA-C 10/25/2016, 7:24 AM

## 2016-10-25 NOTE — NC FL2 (Signed)
[place] LEVEL OF CARE SCREENING TOOL     IDENTIFICATION  Patient Name: Christine [name] Birthdate: 06-06-1920 Sex: female Admission Date (Current Location): 10/24/2016  [place] and [place] Number:  Engineering geologist and Address:  [facility], [address], [place], [place] [address]      Provider Number: [id number]  Attending Physician Name and Address:  Corky Mull, MD  Relative Name and Phone Number:       Current Level of Care: Hospital Recommended Level of Care: [place] Prior Approval Number:    Date Approved/Denied:   PASRR Number:  [id number])  Discharge Plan: SNF    Current Diagnoses: Patient Active Problem List   Diagnosis Date Noted  . Pressure injury of skin 10/25/2016  . 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 Hip fracture ([place]) 01/09/2015    Orientation RESPIRATION BLADDER Height & Weight     Self, Time, Situation, Place  Normal Continent Weight: 120 lb (54.4 kg) Height:  5\' 1"  (154.9 cm)  BEHAVIORAL SYMPTOMS/MOOD NEUROLOGICAL BOWEL NUTRITION STATUS   (none)  (none) Continent Diet (Diet: Heart Healthy/ Carb Modified. )  AMBULATORY STATUS COMMUNICATION OF NEEDS Skin   Extensive Assist Verbally Surgical wounds (Incision: Right Hip. )                       Personal Care Assistance Level of Assistance  Bathing, Feeding, Dressing Bathing Assistance: Limited assistance Feeding assistance: Independent Dressing Assistance: Limited assistance     Functional Limitations Info  Sight, Hearing, Speech Sight Info: Adequate Hearing Info: Adequate Speech Info: Adequate    SPECIAL CARE FACTORS FREQUENCY  PT (By licensed PT), OT (By licensed OT)     PT Frequency:  (5) OT Frequency:  (5)            Contractures      Additional Factors Info  Code Status, Allergies Code Status Info:  (Full Code. ) Allergies Info:  (Adhesive tape. )           Current  Medications (10/25/2016):  This is the current hospital active medication list Current Facility-Administered Medications  Medication Dose Route Frequency Provider Last Rate Last Dose  . acetaminophen (TYLENOL) tablet 650 mg  650 mg Oral Q6H PRN Poggi, Marshall Cork, MD       Or  . acetaminophen (TYLENOL) suppository 650 mg  650 mg Rectal Q6H PRN Poggi, Marshall Cork, MD      . acetaminophen (TYLENOL) tablet 1,000 mg  1,000 mg Oral Q6H Poggi, Marshall Cork, MD   1,000 mg at 10/25/16 0515  . bisacodyl (DULCOLAX) suppository 10 mg  10 mg Rectal Daily PRN Poggi, Marshall Cork, MD      . calcium carbonate (TUMS - dosed in mg elemental calcium) chewable tablet 500 mg  500 mg Oral BID WC Poggi, Marshall Cork, MD      . ceFAZolin (ANCEF) IVPB 2g/100 mL premix  2 g Intravenous Q6H Poggi, Marshall Cork, MD   Stopped at 10/25/16 0545  . cholecalciferol (VITAMIN D) tablet 1,000 Units  1,000 Units Oral Caleen Essex, MD   1,000 Units at 10/25/16 [phone]  . cholecalciferol (VITAMIN D) tablet 2,000 Units  2,000 Units Oral QODAY Poggi, Marshall Cork, MD      . dextrose 5 %-0.9 % sodium chloride infusion   Intravenous Continuous Poggi, Marshall Cork, MD 75 mL/hr at 10/24/16 2225    . diphenhydrAMINE (BENADRYL) 12.5 MG/5ML  elixir 12.5-25 mg  12.5-25 mg Oral Q4H PRN Poggi, Marshall Cork, MD      . docusate sodium (COLACE) capsule 100 mg  100 mg Oral BID Corky Mull, MD   100 mg at 10/25/16 1470  . enoxaparin (LOVENOX) injection 40 mg  40 mg Subcutaneous Q24H Poggi, Marshall Cork, MD   40 mg at 10/25/16 0900  . hydrochlorothiazide (HYDRODIURIL) tablet 12.5 mg  12.5 mg Oral Daily Poggi, Marshall Cork, MD      . levothyroxine (SYNTHROID, LEVOTHROID) tablet 100 mcg  100 mcg Oral QAC breakfast Poggi, Marshall Cork, MD   100 mcg at 10/25/16 0900  . magnesium hydroxide (MILK OF MAGNESIA) suspension 30 mL  30 mL Oral Daily PRN Poggi, Marshall Cork, MD      . metoCLOPramide (REGLAN) tablet 5-10 mg  5-10 mg Oral Q8H PRN Poggi, Marshall Cork, MD       Or  . metoCLOPramide (REGLAN) injection 5-10 mg  5-10 mg  Intravenous Q8H PRN Poggi, Marshall Cork, MD   10 mg at 10/25/16 0000  . morphine 2 MG/ML injection 1-2 mg  1-2 mg Intravenous Q2H PRN Poggi, Marshall Cork, MD      . ondansetron [facility]) tablet 4 mg  4 mg Oral Q6H PRN Poggi, Marshall Cork, MD       Or  . ondansetron [facility]) injection 4 mg  4 mg Intravenous Q6H PRN Poggi, Marshall Cork, MD   4 mg at 10/24/16 2225  . oxyCODONE (Oxy IR/ROXICODONE) immediate release tablet 5-10 mg  5-10 mg Oral Q3H PRN Poggi, Marshall Cork, MD      . pantoprazole (PROTONIX) EC tablet 40 mg  40 mg Oral BID Corky Mull, MD   40 mg at 10/25/16 0903  . polyvinyl alcohol (LIQUIFILM TEARS) 1.4 % ophthalmic solution 1 drop  1 drop Both Eyes TID PRN Poggi, Marshall Cork, MD      . promethazine (PHENERGAN) injection 25 mg  25 mg Intramuscular Q6H PRN Hessie Knows, MD      . raloxifene (EVISTA) tablet 60 mg  60 mg Oral Q lunch Poggi, Marshall Cork, MD      . sodium phosphate (FLEET) 7-19 GM/118ML enema 1 enema  1 enema Rectal Once PRN Poggi, Marshall Cork, MD      . traMADol Veatrice Bourbon) tablet 50 mg  50 mg Oral Q6H PRN Lattie Corns, PA-C         Discharge Medications: Please see discharge summary for a list of discharge medications.  Relevant Imaging Results:  Relevant Lab Results:   Additional Information  (SSN: [id number])  [employer], Veronia Beets, LCSW

## 2016-10-25 NOTE — Anesthesia Postprocedure Evaluation (Signed)
Anesthesia Post Note  Patient: Christine [name]  Procedure(s) Performed: Procedure(s) (LRB): TOTAL HIP REVISION AND HARDWARE REMOVAL (Right)  Patient location during evaluation: Nursing Unit Anesthesia Type: Spinal Level of consciousness: awake, awake and alert and oriented Pain management: pain level controlled Vital Signs Assessment: post-procedure vital signs reviewed and stable Respiratory status: spontaneous breathing, nonlabored ventilation and respiratory function stable Cardiovascular status: blood pressure returned to baseline and stable Postop Assessment: no headache and no backache Anesthetic complications: no     Last Vitals:  Vitals:   10/25/16 0022 10/25/16 0502  BP: (!) 121/48 134/62  Pulse: 84 91  Resp:    Temp: 36.9 C (!) 36.4 C  SpO2: 100% 100%    Last Pain:  Vitals:   10/25/16 0615  TempSrc:   PainSc: Asleep                 Johnna Acosta

## 2016-10-25 NOTE — Progress Notes (Signed)
  Subjective: 1 Day Post-Op Procedure(s) (LRB): TOTAL HIP REVISION AND HARDWARE REMOVAL (Right) Patient reports pain as mild.   Patient is well, and has had no acute complaints or problems PT and Care Management to assist with discharge planning. Negative for chest pain and shortness of breath Fever: no Gastrointestinal:Negative for nausea and vomiting  Objective: Vital signs in last 24 hours: Temp:  [97.1 F (36.2 C)-98.4 F (36.9 C)] 97.5 F (36.4 C) (08/10 0502) Pulse Rate:  [62-104] 91 (08/10 0502) Resp:  [12-22] 17 (08/09 2142) BP: (111-164)/(46-79) 134/62 (08/10 0502) SpO2:  [94 %-100 %] 100 % (08/10 0502) Weight:  [54.4 kg (120 lb)] 54.4 kg (120 lb) (08/09 1201)  Intake/Output from previous day:  Intake/Output Summary (Last 24 hours) at 10/25/16 0719 Last data filed at 10/25/16 0509  Gross per 24 hour  Intake          3903.75 ml  Output             1790 ml  Net          2113.75 ml    Intake/Output this shift: No intake/output data recorded.  Labs:  Recent Labs  10/25/16 0450  HGB 9.7*    Recent Labs  10/25/16 0450  WBC 17.1*  RBC 3.14*  HCT 28.6*  PLT 183    Recent Labs  10/25/16 0450  NA 137  K 3.9  CL 105  CO2 23  BUN 18  CREATININE 1.00  GLUCOSE 209*  CALCIUM 7.5*   No results for input(s): LABPT, INR in the last 72 hours.   EXAM General - Patient is Alert, Appropriate and Oriented Extremity - ABD soft Intact pulses distally Dorsiflexion/Plantar flexion intact Incision: scant drainage No cellulitis present Dressing/Incision - Mild bloody drainage to the distal incision, mild serosanguinous drainage. Motor Function - intact, moving foot and toes well on exam.  Abdomen soft with normal BS.  Past Medical History:  Diagnosis Date  . Cancer ([place])    colon  . Carpal tunnel syndrome   . Complication of anesthesia    hard time waking up after thyroidectomy  . DJD (degenerative joint disease)   . Family history of adverse reaction  to anesthesia    daughter PONV  . Hearing loss   . Hypothyroidism   . Osteoporosis   . Wears glasses    Assessment/Plan: 1 Day Post-Op Procedure(s) (LRB): TOTAL HIP REVISION AND HARDWARE REMOVAL (Right) Active Problems:   Status post revision of total hip replacement  Estimated body mass index is 22.67 kg/m as calculated from the following:   Height as of this encounter: 5\' 1"  (1.549 m).   Weight as of this encounter: 54.4 kg (120 lb). Advance diet Up with therapy D/C IV fluids when tolerating po intake.  Labs reviewed, WBC 17.1, likely post-op inflammation.  Hg 9.7, did receive one unit of PRBC in the OR, CBC and BMP ordered for tomorrow morning.  Transfuse if below 7.0. Foley to be removed today. Up with therapy today. Will likely need SNF placement, plan will be for discharge home on Sunday pending progress with PT and Hg.  DVT Prophylaxis - Lovenox, Foot Pumps and TED hose Weight-Bearing as tolerated to right leg  J. Cameron Proud, PA-C [facility] 10/25/2016, 7:19 AM

## 2016-10-25 NOTE — Discharge Instructions (Signed)

## 2016-10-26 LAB — BASIC METABOLIC PANEL
Anion gap: 5 (ref 5–15)
BUN: 22 mg/dL — AB (ref 6–20)
CALCIUM: 7.6 mg/dL — AB (ref 8.9–10.3)
CO2: 27 mmol/L (ref 22–32)
Chloride: 107 mmol/L (ref 101–111)
Creatinine, Ser: 1.26 mg/dL — ABNORMAL HIGH (ref 0.44–1.00)
GFR calc Af Amer: 41 mL/min — ABNORMAL LOW (ref >60)
GFR, EST NON AFRICAN AMERICAN: 35 mL/min — AB (ref >60)
GLUCOSE: 125 mg/dL — AB (ref 65–99)
POTASSIUM: 4 mmol/L (ref 3.5–5.1)
Sodium: 139 mmol/L (ref 135–145)

## 2016-10-26 LAB — HEMOGLOBIN AND HEMATOCRIT, BLOOD
HEMATOCRIT: 26.5 % — AB (ref 35.0–47.0)
HEMOGLOBIN: 8.9 g/dL — AB (ref 12.0–16.0)

## 2016-10-26 LAB — CBC
HEMATOCRIT: 23.8 % — AB (ref 35.0–47.0)
Hemoglobin: 8 g/dL — ABNORMAL LOW (ref 12.0–16.0)
MCH: 30.5 pg (ref 26.0–34.0)
MCHC: 33.6 g/dL (ref 32.0–36.0)
MCV: 90.9 fL (ref 80.0–100.0)
Platelets: 182 10*3/uL (ref 150–440)
RBC: 2.62 MIL/uL — ABNORMAL LOW (ref 3.80–5.20)
RDW: 15.6 % — AB (ref 11.5–14.5)
WBC: 17.8 10*3/uL — ABNORMAL HIGH (ref 3.6–11.0)

## 2016-10-26 LAB — PREPARE RBC (CROSSMATCH)

## 2016-10-26 MED ORDER — SODIUM CHLORIDE 0.9 % IV SOLN
Freq: Once | INTRAVENOUS | Status: AC
Start: 2016-10-26 — End: 2016-10-26
  Administered 2016-10-26: 08:00:00 via INTRAVENOUS

## 2016-10-26 NOTE — Progress Notes (Signed)
  Subjective: 2 Days Post-Op Procedure(s) (LRB): TOTAL HIP REVISION AND HARDWARE REMOVAL (Right) Patient reports pain as mild.   Patient is well, and has had no acute complaints or problems PT and Care Management to assist with discharge planning. Negative for chest pain and shortness of breath Fever: no Gastrointestinal:Negative for nausea and vomiting  Objective: Vital signs in last 24 hours: Temp:  [97.5 F (36.4 C)-98.5 F (36.9 C)] 97.5 F (36.4 C) (08/10 2231) Pulse Rate:  [94-102] 100 (08/10 2231) Resp:  [18-20] 20 (08/10 2231) BP: (127-131)/(50-70) 131/70 (08/10 2231) SpO2:  [96 %-100 %] 96 % (08/10 2231)  Intake/Output from previous day:  Intake/Output Summary (Last 24 hours) at 10/26/16 0726 Last data filed at 10/26/16 0025  Gross per 24 hour  Intake              360 ml  Output              300 ml  Net               60 ml    Intake/Output this shift: No intake/output data recorded.  Labs:  Recent Labs  10/25/16 0450 10/26/16 0413  HGB 9.7* 8.0*    Recent Labs  10/25/16 0450 10/26/16 0413  WBC 17.1* 17.8*  RBC 3.14* 2.62*  HCT 28.6* 23.8*  PLT 183 182    Recent Labs  10/25/16 0450 10/26/16 0413  NA 137 139  K 3.9 4.0  CL 105 107  CO2 23 27  BUN 18 22*  CREATININE 1.00 1.26*  GLUCOSE 209* 125*  CALCIUM 7.5* 7.6*   No results for input(s): LABPT, INR in the last 72 hours.   EXAM General - Patient is Alert, Appropriate and Oriented Extremity - ABD soft Intact pulses distally Dorsiflexion/Plantar flexion intact Incision: scant drainage No cellulitis present Dressing/Incision - Mild bloody drainage to the distal incision, mild serosanguinous drainage. Motor Function - intact, moving foot and toes well on exam.  Abdomen soft with normal BS.  Past Medical History:  Diagnosis Date  . Cancer ([place])    colon  . Carpal tunnel syndrome   . Complication of anesthesia    hard time waking up after thyroidectomy  . DJD (degenerative joint  disease)   . Family history of adverse reaction to anesthesia    daughter PONV  . Hearing loss   . Hypothyroidism   . Osteoporosis   . Wears glasses    Assessment/Plan: 2 Days Post-Op Procedure(s) (LRB): TOTAL HIP REVISION AND HARDWARE REMOVAL (Right) Active Problems:   Status post revision of total hip replacement   Pressure injury of skin  Estimated body mass index is 22.67 kg/m as calculated from the following:   Height as of this encounter: 5\' 1"  (1.549 m).   Weight as of this encounter: 54.4 kg (120 lb). Advance diet Up with therapy D/C IV fluids when tolerating po intake.  Labs reviewed, WBC 17.1, likely post-op inflammation.  Hg 8..0, did receive one unit of PRBC in the OR.  Dizzy with sitting up and lying down. Transfuse 1 unit PRBC this AM.  Up with therapy today after transfusion. Will likely need SNF placement, plan will be for discharge home on Sunday pending progress with PT and Hg.  DVT Prophylaxis - Lovenox, Foot Pumps and TED hose Weight-Bearing as tolerated to right leg  Reche Dixon, PA-C [facility] 10/26/2016, 7:26 AM

## 2016-10-26 NOTE — Progress Notes (Signed)
Pt remaining alert and oriented this shift. Up to bedside commode with assistance. Minimal pain this shift. Surgical dressing with old drainage. Pt able to sleep in between care.

## 2016-10-26 NOTE — Progress Notes (Signed)
PT Cancellation Note  Patient Details Name: Christine [name] MRN: [id number] DOB: [birth year]   Cancelled Treatment:    Reason Eval/Treat Not Completed: Other (comment). Pt unavailable x 3 when PT attempted due to trips to BR and her IV being pulled out.  Will try again early in the AM.   Ramond Dial 10/26/2016, 4:25 PM  Mee Hives, PT MS Acute Rehab Dept. Number: [place] and [place]

## 2016-10-26 NOTE — Progress Notes (Signed)
Physical Therapy Treatment Patient Details Name: Christine [name] MRN: [id number] DOB: Jul 18, 1920 Today's Date: 10/26/2016    History of Present Illness Pt is a 83 year old female who is now nearly 2 years status post a trochanteric femoral nailing for a displaced intertrochanteric fracture of her right hip. Initially, the patient seemed to be doing well and she was discharged from follow-up. However, about 3-4 weeks ago, the patient began to experience increased pain in her hip. Subsequent x-rays demonstrated that the femoral neck portion of the fracture had not healed and that the lag screw had penetrated the femoral head. Pt is now s/p removal of the intramedullary nail and revision right total hip arthroplasty with posterior approach.  PMH includes: DJD, HOH, hypothyroidism, and osteoporosis.     PT Comments    Pt in bed,chart reviewed.  Awaiting blood transfusion then up with PT.  Participated in exercises as described below.  Discussed at length discharge planning with pt and family in room.  Family stated they would like pt to return home upon discharge if able.  Will continue to monitor mobility skills and adjust recommendations as appropriate.  Will return in pm for mobility skills after blood transfusion is complete.   Follow Up Recommendations  SNF     Equipment Recommendations  None recommended by PT    Recommendations for Other Services       Precautions / Restrictions Precautions Precautions: Fall;Posterior Hip Precaution Comments: Posterior hip precaution education with pt and granddaughter reinforced Restrictions Weight Bearing Restrictions: Yes RLE Weight Bearing: Weight bearing as tolerated    Mobility  Bed Mobility                  Transfers                    Ambulation/Gait                 Stairs            Wheelchair Mobility    Modified Rankin (Stroke Patients Only)       Balance                                             Cognition Arousal/Alertness: Awake/alert Behavior During Therapy: WFL for tasks assessed/performed Overall Cognitive Status: Within Functional Limits for tasks assessed                                        Exercises Other Exercises Other Exercises: Supine A/AAROM BLE RLE 2 x 10, LLE x 10 for ankle plumps, glut sets, pillow squeeze, SLR, heel slides, SAQ and  ab/add     General Comments        Pertinent Vitals/Pain Pain Assessment: 0-10 Pain Score: 3  Pain Descriptors / Indicators: Sore;Operative site guarding Pain Intervention(s): Limited activity within patient's tolerance;Premedicated before session    Home Living                      Prior Function            PT Goals (current goals can now be found in the care plan section) Progress towards PT goals: Progressing toward goals    Frequency    BID      PT  Plan Current plan remains appropriate    Co-evaluation              AM-PAC PT "6 Clicks" Daily Activity  Outcome Measure  Difficulty turning over in bed (including adjusting bedclothes, sheets and blankets)?: Total Difficulty moving from lying on back to sitting on the side of the bed? : Total Difficulty sitting down on and standing up from a chair with arms (e.g., wheelchair, bedside commode, etc,.)?: Total Help needed moving to and from a bed to chair (including a wheelchair)?: A Little Help needed walking in hospital room?: A Little Help needed climbing 3-5 steps with a railing? : A Lot 6 Click Score: 11    End of Session   Activity Tolerance: Patient limited by fatigue Patient left: in bed;with bed alarm set;with call bell/phone within reach;with family/visitor present         Time: 6122-4497 PT Time Calculation (min) (ACUTE ONLY): 23 min  Charges:  $Therapeutic Exercise: 23-37 mins                    G Codes:       Chesley Noon, PTA 10/26/16, 11:42 AM

## 2016-10-26 NOTE — Progress Notes (Signed)
Pt completed 1 unit of blood with no adverse reactions. Pulled out 2nd IV site when up to [facility].

## 2016-10-26 NOTE — Progress Notes (Signed)
Pt declined hclthz 12.5 this AM, she only takes PRN at home for swelling in feet/legs.

## 2016-10-26 NOTE — Progress Notes (Signed)
Dressing changed to hip. Telfa and honeycomb applied, copious serosanguinous drainage from site.

## 2016-10-26 NOTE — Progress Notes (Addendum)
OT Cancellation Note  Patient Details Name: Christine [name] MRN: [id number] DOB: [birth year]   Cancelled Treatment:    Reason Eval/Treat Not Completed: Medical issues which prohibited therapy. Order received, chart reviewed. Per ortho note, pt dizzy with sitting up and lying down, Hgb 8.0, to receive blood transfusion this morning, and up with therapy after transfusion. Will hold OT evaluation this morning and continue to follow acutely for appropriateness to re-attempt OT evaluation. Addendum: pt still awaiting blood transfusion on 2nd attempt. Will re-attempt at later date/time as pt is appropriate and as schedule allows.  Jeni Salles, MPH, MS, OTR/L ascom [phone] 10/26/16, 12:54 PM

## 2016-10-27 LAB — TYPE AND SCREEN
ABO/RH(D): A POS
Antibody Screen: NEGATIVE
UNIT DIVISION: 0
Unit division: 0

## 2016-10-27 LAB — BPAM RBC
BLOOD PRODUCT EXPIRATION DATE: 201808152359
Blood Product Expiration Date: 201808162359
ISSUE DATE / TIME: 201808111202
ISSUE DATE / TIME: 201808091741
UNIT TYPE AND RH: 6200
Unit Type and Rh: 5100

## 2016-10-27 LAB — BASIC METABOLIC PANEL
ANION GAP: 6 (ref 5–15)
BUN: 21 mg/dL — ABNORMAL HIGH (ref 6–20)
CALCIUM: 7.7 mg/dL — AB (ref 8.9–10.3)
CO2: 27 mmol/L (ref 22–32)
Chloride: 106 mmol/L (ref 101–111)
Creatinine, Ser: 0.74 mg/dL (ref 0.44–1.00)
GLUCOSE: 113 mg/dL — AB (ref 65–99)
Potassium: 4 mmol/L (ref 3.5–5.1)
Sodium: 139 mmol/L (ref 135–145)

## 2016-10-27 LAB — HEMOGLOBIN: Hemoglobin: 8.7 g/dL — ABNORMAL LOW (ref 12.0–16.0)

## 2016-10-27 NOTE — Progress Notes (Signed)
  Subjective: 3 Days Post-Op Procedure(s) (LRB): TOTAL HIP REVISION AND HARDWARE REMOVAL (Right) Patient reports pain as mild.   Patient is well, and has had no acute complaints or problems PT and Care Management to assist with discharge planning. Plan for going home with family. Negative for chest pain and shortness of breath Fever: no Gastrointestinal:Negative for nausea and vomiting  Objective: Vital signs in last 24 hours: Temp:  [98.5 F (36.9 C)-99.2 F (37.3 C)] 99.2 F (37.3 C) (08/11 2149) Pulse Rate:  [100-121] 100 (08/11 2149) Resp:  [18] 18 (08/11 1233) BP: (125-151)/(42-59) 151/49 (08/11 2149) SpO2:  [91 %-97 %] 97 % (08/11 2149)  Intake/Output from previous day:  Intake/Output Summary (Last 24 hours) at 10/27/16 0633 Last data filed at 10/27/16 0103  Gross per 24 hour  Intake             1020 ml  Output                0 ml  Net             1020 ml    Intake/Output this shift: No intake/output data recorded.  Labs:  Recent Labs  10/25/16 0450 10/26/16 0413 10/26/16 1640 10/27/16 0336  HGB 9.7* 8.0* 8.9* 8.7*    Recent Labs  10/25/16 0450 10/26/16 0413 10/26/16 1640  WBC 17.1* 17.8*  --   RBC 3.14* 2.62*  --   HCT 28.6* 23.8* 26.5*  PLT 183 182  --     Recent Labs  10/26/16 0413 10/27/16 0336  NA 139 139  K 4.0 4.0  CL 107 106  CO2 27 27  BUN 22* 21*  CREATININE 1.26* 0.74  GLUCOSE 125* 113*  CALCIUM 7.6* 7.7*   No results for input(s): LABPT, INR in the last 72 hours.   EXAM General - Patient is Alert, Appropriate and Oriented Extremity - ABD soft Intact pulses distally Dorsiflexion/Plantar flexion intact Incision: scant drainage No cellulitis present Dressing/Incision - Clean dry and intact. Motor Function - intact, moving foot and toes well on exam.  Abdomen soft with normal BS.  Past Medical History:  Diagnosis Date  . Cancer ([place])    colon  . Carpal tunnel syndrome   . Complication of anesthesia    hard time  waking up after thyroidectomy  . DJD (degenerative joint disease)   . Family history of adverse reaction to anesthesia    daughter PONV  . Hearing loss   . Hypothyroidism   . Osteoporosis   . Wears glasses    Assessment/Plan: 3 Days Post-Op Procedure(s) (LRB): TOTAL HIP REVISION AND HARDWARE REMOVAL (Right) Active Problems:   Status post revision of total hip replacement   Pressure injury of skin  Estimated body mass index is 22.67 kg/m as calculated from the following:   Height as of this encounter: 5\' 1"  (1.549 m).   Weight as of this encounter: 54.4 kg (120 lb).  Regular diet Continue physical therapy.  Hemoglobin 8.7 this morning.  Up with therapy today Discharge home with home health physical therapy. Family to assist.  DVT Prophylaxis - Lovenox, Foot Pumps and TED hose Weight-Bearing as tolerated to right leg  Reche Dixon, PA-C [facility] Orthopaedic Surgery 10/27/2016, 6:33 AM

## 2016-10-27 NOTE — Clinical Social Work Note (Signed)
CSW aware that patient will not be discharging to a SNF; instead, she will pursue HHPT. CSW is signing off.  Santiago Bumpers, MSW, Latanya Presser [phone]

## 2016-10-27 NOTE — Care Management Note (Signed)
Case Management Note  Patient Details  Name: Christine [name] MRN: [id number] Date of Birth: Oct 25, 1920  Subjective/Objective:   Dr Roland Rack also ordered HH-RN which was called to [facility].                  Action/Plan:   Expected Discharge Date:  10/27/16               Expected Discharge Plan:  [place]  In-House Referral:     Discharge planning Services  CM Consult  Post Acute Care Choice:  Home Health Choice offered to:  Patient, Adult Children  DME Arranged:    DME Agency:     HH Arranged:  PT, RN [place] Agency:  [place]  Status of Service:  Completed, signed off  If discussed at [place] of Stay Meetings, dates discussed:    Additional Comments:  [name], RN 10/27/2016, 11:21 AM

## 2016-10-27 NOTE — Discharge Summary (Signed)
Physician Discharge Summary  Subjective: 3 Days Post-Op Procedure(s) (LRB): TOTAL HIP REVISION AND HARDWARE REMOVAL (Right) Patient reports pain as mild.   Patient seen in rounds with Dr. Roland [name]. Patient is well, and has had no acute complaints or problems Patient is ready to go Home with home health physical therapy.  Physician Discharge Summary  Patient ID: Christine [name] MRN: [id number] DOB/AGE: [birth year] 83 y.o.  Admit date: 10/24/2016 Discharge date: 10/27/2016  Admission Diagnoses:  Discharge Diagnoses:  Active Problems:   Status post revision of total hip replacement   Pressure injury of skin   Discharged Condition: fair  Hospital Course: The patient is now postop day 3 from a right hip fracture. She is doing well since surgery. She has had a bowel movement. She has been working slowly with physical therapy. Her hemoglobin yesterday was 8.0 and she received 1 unit of packed red blood cells. Her hemoglobin this morning 70.7. She is stable and ready to go home with family and home health physical therapy.  Treatments: surgery:  Revision right total hip arthroplasty with removal of intramedullary nail, right hip.  Surgeon:   Christine Lux, MD  Assistant:   Christine Proud, PA-C  Anesthesia:   GET following failed attempted Spinal  Findings:   As above.  Complications:   None  EBL:   1250 cc  Fluids:   2300 cc crystalloid, one unit of packed RBCs  UOP:   250 cc  TT:   None  Drains:   None  Closure:   Staples  Implants:   Biomet press-fit system with a #16 Arcos high offset calcar replacement revision femoral stem, a 52 mm acetabular shell with four acetabular screws, a 42 mm E-sized Dual Mobility acetabular liner, a 42 x 28 mm Dual Mobility E-polyethylene outer diameter shell, and a 28 mm ceramic head with a -6 mm adapter.  Discharge Exam: Blood pressure (!) 151/49, pulse 100, temperature 99.2 F (37.3 C), temperature source Oral, resp. rate 18,  height 5\' 1"  (1.549 m), weight 54.4 kg (120 lb), SpO2 97 %.   Disposition: 01-Home or Self Care   Allergies as of 10/27/2016      Reactions   Adhesive [tape] Other (See Comments)   Red itchy      Medication List    TAKE these medications   calcium carbonate 1500 (600 Ca) MG Tabs tablet Commonly known as:  OSCAL Take 600 mg by mouth 2 (two) times daily with a meal.   cholecalciferol 1000 units tablet Commonly known as:  VITAMIN D Take 1,000 Units by mouth every other day.   KP VITAMIN D3 2000 units Caps Generic drug:  Cholecalciferol Take 2,000 Units by mouth every other day.   enoxaparin 40 MG/0.4ML injection Commonly known as:  LOVENOX Inject 0.4 mLs (40 mg total) into the skin daily.   hydrochlorothiazide 12.5 MG tablet Commonly known as:  HYDRODIURIL Take 12.5 mg by mouth daily as needed. For fluid retention.   levothyroxine 100 MCG tablet Commonly known as:  SYNTHROID, LEVOTHROID Take 100 mcg by mouth daily before breakfast.   LUBRICANT EYE DROPS 0.4-0.3 % Soln Generic drug:  Polyethyl Glycol-Propyl Glycol Place 1 drop into both eyes 3 (three) times daily as needed. For dry eyes.   oxyCODONE 5 MG immediate release tablet Commonly known as:  Oxy IR/ROXICODONE Take 1-2 tablets (5-10 mg total) by mouth every 4 (four) hours as needed for breakthrough pain.   pantoprazole 40 MG tablet Commonly known as:  PROTONIX  Take 1 tablet (40 mg total) by mouth 2 (two) times daily. What changed:  when to take this  reasons to take this   raloxifene 60 MG tablet Commonly known as:  EVISTA Take 60 mg by mouth daily with lunch.   traMADol 50 MG tablet Commonly known as:  ULTRAM Take 1 tablet (50 mg total) by mouth every 6 (six) hours as needed.       Contact information for follow-up providers    Christine Corns, PA-C Follow up in 14 day(s).   Specialty:  Physician Assistant Why:  Christine [name] information: [place] [address] [phone]            Contact information for after-discharge care    Destination    [facility] Follow up.   Specialty:  [place] information: [address] [place] [phone]                  Signed: [place] [name], Christine [name] 10/27/2016, 6:37 AM   Objective: Vital signs in last 24 hours: Temp:  [98.5 F (36.9 C)-99.2 F (37.3 C)] 99.2 F (37.3 C) (08/11 2149) Pulse Rate:  [100-121] 100 (08/11 2149) Resp:  [18] 18 (08/11 1233) BP: (125-151)/(42-59) 151/49 (08/11 2149) SpO2:  [91 %-97 %] 97 % (08/11 2149)  Intake/Output from previous day:  Intake/Output Summary (Last 24 hours) at 10/27/16 0637 Last data filed at 10/27/16 0103  Gross per 24 hour  Intake             1020 ml  Output                0 ml  Net             1020 ml    Intake/Output this shift: No intake/output data recorded.  Labs:  Recent Labs  10/25/16 0450 10/26/16 0413 10/26/16 1640 10/27/16 0336  HGB 9.7* 8.0* 8.9* 8.7*    Recent Labs  10/25/16 0450 10/26/16 0413 10/26/16 1640  WBC 17.1* 17.8*  --   RBC 3.14* 2.62*  --   HCT 28.6* 23.8* 26.5*  PLT 183 182  --     Recent Labs  10/26/16 0413 10/27/16 0336  NA 139 139  K 4.0 4.0  CL 107 106  CO2 27 27  BUN 22* 21*  CREATININE 1.26* 0.74  GLUCOSE 125* 113*  CALCIUM 7.6* 7.7*   No results for input(s): LABPT, INR in the last 72 hours.  EXAM: General - Patient is Alert and Oriented Extremity - Neurologically intact Sensation intact distally Dorsiflexion/Plantar flexion intact No cellulitis present Compartment soft Incision - clean, dry, no drainage Motor Function -  plantarflexion and dorsiflexion intact.  Assessment/Plan: 3 Days Post-Op Procedure(s) (LRB): TOTAL HIP REVISION AND HARDWARE REMOVAL (Right) Procedure(s) (LRB): TOTAL HIP REVISION AND HARDWARE REMOVAL (Right) Past Medical History:  Diagnosis Date  .  Cancer ([place])    colon  . Carpal tunnel syndrome   . Complication of anesthesia    hard time waking up after thyroidectomy  . DJD (degenerative joint disease)   . Family history of adverse reaction to anesthesia    daughter PONV  . Hearing loss   . Hypothyroidism   . Osteoporosis   . Wears glasses    Active Problems:   Status post revision of total hip replacement   Pressure injury of skin  Estimated body mass index is 22.67 kg/m as calculated from the following:  Height as of this encounter: 5\' 1"  (1.549 m).   Weight as of this encounter: 54.4 kg (120 lb). Up with therapy Discharge home with home health Diet - Regular diet Follow up - in 2 weeks Activity - WBAT Disposition - Home Condition Upon Discharge - Stable DVT Prophylaxis - Lovenox and TED hose  Reche Dixon, PA-C Orthopaedic Surgery 10/27/2016, 6:37 AM

## 2016-10-27 NOTE — Care Management Note (Signed)
Case Management Note  Patient Details  Name: Christine [name] MRN: [id number] Date of Birth: 04-02-1920  Subjective/Objective:     Discussed discharge planning with Christine [name] and her daughter. Christine [name] is being discharged today to her grand daughters home:  Christine [name], ph: [phone]. Address: [address], [place], [place] [address]. Has all DME equipment. A referral was called to Christine [name] at [facility] for HH-PT.               Action/Plan:   Expected Discharge Date:  10/27/16               Expected Discharge Plan:  [place]  In-House Referral:     Discharge planning Services  CM Consult  Post Acute Care Choice:  Home Health Choice offered to:  Patient, Adult Children  DME Arranged:    DME Agency:     HH Arranged:  PT [place]:  [place]  Status of Service:  Completed, signed off  If discussed at [place] of Stay Meetings, dates discussed:    Additional Comments:  Skyy Nilan A, RN 10/27/2016, 9:17 AM

## 2016-10-28 ENCOUNTER — Encounter: Payer: Self-pay | Admitting: Surgery

## 2016-10-28 DIAGNOSIS — E039 Hypothyroidism, unspecified: Secondary | ICD-10-CM | POA: Diagnosis not present

## 2016-10-28 DIAGNOSIS — M1990 Unspecified osteoarthritis, unspecified site: Secondary | ICD-10-CM | POA: Diagnosis not present

## 2016-10-28 DIAGNOSIS — H9190 Unspecified hearing loss, unspecified ear: Secondary | ICD-10-CM | POA: Diagnosis not present

## 2016-10-28 DIAGNOSIS — M810 Age-related osteoporosis without current pathological fracture: Secondary | ICD-10-CM | POA: Diagnosis not present

## 2016-10-28 DIAGNOSIS — S72141K Displaced intertrochanteric fracture of right femur, subsequent encounter for closed fracture with nonunion: Secondary | ICD-10-CM | POA: Diagnosis not present

## 2016-10-28 DIAGNOSIS — Z85038 Personal history of other malignant neoplasm of large intestine: Secondary | ICD-10-CM | POA: Diagnosis not present

## 2016-10-28 DIAGNOSIS — Z96641 Presence of right artificial hip joint: Secondary | ICD-10-CM | POA: Diagnosis not present

## 2016-10-29 DIAGNOSIS — Z96641 Presence of right artificial hip joint: Secondary | ICD-10-CM | POA: Diagnosis not present

## 2016-10-29 DIAGNOSIS — M1990 Unspecified osteoarthritis, unspecified site: Secondary | ICD-10-CM | POA: Diagnosis not present

## 2016-10-29 DIAGNOSIS — S72141K Displaced intertrochanteric fracture of right femur, subsequent encounter for closed fracture with nonunion: Secondary | ICD-10-CM | POA: Diagnosis not present

## 2016-10-29 DIAGNOSIS — H9190 Unspecified hearing loss, unspecified ear: Secondary | ICD-10-CM | POA: Diagnosis not present

## 2016-10-29 DIAGNOSIS — E039 Hypothyroidism, unspecified: Secondary | ICD-10-CM | POA: Diagnosis not present

## 2016-10-29 DIAGNOSIS — M810 Age-related osteoporosis without current pathological fracture: Secondary | ICD-10-CM | POA: Diagnosis not present

## 2016-10-29 DIAGNOSIS — Z85038 Personal history of other malignant neoplasm of large intestine: Secondary | ICD-10-CM | POA: Diagnosis not present

## 2016-10-29 LAB — SURGICAL PATHOLOGY

## 2016-11-01 DIAGNOSIS — Z96641 Presence of right artificial hip joint: Secondary | ICD-10-CM | POA: Diagnosis not present

## 2016-11-01 DIAGNOSIS — M810 Age-related osteoporosis without current pathological fracture: Secondary | ICD-10-CM | POA: Diagnosis not present

## 2016-11-01 DIAGNOSIS — H9190 Unspecified hearing loss, unspecified ear: Secondary | ICD-10-CM | POA: Diagnosis not present

## 2016-11-01 DIAGNOSIS — Z85038 Personal history of other malignant neoplasm of large intestine: Secondary | ICD-10-CM | POA: Diagnosis not present

## 2016-11-01 DIAGNOSIS — M1990 Unspecified osteoarthritis, unspecified site: Secondary | ICD-10-CM | POA: Diagnosis not present

## 2016-11-01 DIAGNOSIS — E039 Hypothyroidism, unspecified: Secondary | ICD-10-CM | POA: Diagnosis not present

## 2016-11-01 DIAGNOSIS — S72141K Displaced intertrochanteric fracture of right femur, subsequent encounter for closed fracture with nonunion: Secondary | ICD-10-CM | POA: Diagnosis not present

## 2016-11-04 DIAGNOSIS — M1990 Unspecified osteoarthritis, unspecified site: Secondary | ICD-10-CM | POA: Diagnosis not present

## 2016-11-04 DIAGNOSIS — S72141K Displaced intertrochanteric fracture of right femur, subsequent encounter for closed fracture with nonunion: Secondary | ICD-10-CM | POA: Diagnosis not present

## 2016-11-04 DIAGNOSIS — M810 Age-related osteoporosis without current pathological fracture: Secondary | ICD-10-CM | POA: Diagnosis not present

## 2016-11-04 DIAGNOSIS — E039 Hypothyroidism, unspecified: Secondary | ICD-10-CM | POA: Diagnosis not present

## 2016-11-04 DIAGNOSIS — H9190 Unspecified hearing loss, unspecified ear: Secondary | ICD-10-CM | POA: Diagnosis not present

## 2016-11-04 DIAGNOSIS — Z85038 Personal history of other malignant neoplasm of large intestine: Secondary | ICD-10-CM | POA: Diagnosis not present

## 2016-11-04 DIAGNOSIS — Z96641 Presence of right artificial hip joint: Secondary | ICD-10-CM | POA: Diagnosis not present

## 2016-11-06 DIAGNOSIS — Z96641 Presence of right artificial hip joint: Secondary | ICD-10-CM | POA: Diagnosis not present

## 2016-11-06 DIAGNOSIS — S72141K Displaced intertrochanteric fracture of right femur, subsequent encounter for closed fracture with nonunion: Secondary | ICD-10-CM | POA: Diagnosis not present

## 2016-11-06 DIAGNOSIS — Z85038 Personal history of other malignant neoplasm of large intestine: Secondary | ICD-10-CM | POA: Diagnosis not present

## 2016-11-06 DIAGNOSIS — H9190 Unspecified hearing loss, unspecified ear: Secondary | ICD-10-CM | POA: Diagnosis not present

## 2016-11-06 DIAGNOSIS — M810 Age-related osteoporosis without current pathological fracture: Secondary | ICD-10-CM | POA: Diagnosis not present

## 2016-11-06 DIAGNOSIS — E039 Hypothyroidism, unspecified: Secondary | ICD-10-CM | POA: Diagnosis not present

## 2016-11-06 DIAGNOSIS — M1990 Unspecified osteoarthritis, unspecified site: Secondary | ICD-10-CM | POA: Diagnosis not present

## 2016-11-08 DIAGNOSIS — S72141K Displaced intertrochanteric fracture of right femur, subsequent encounter for closed fracture with nonunion: Secondary | ICD-10-CM | POA: Diagnosis not present

## 2016-11-08 DIAGNOSIS — H9190 Unspecified hearing loss, unspecified ear: Secondary | ICD-10-CM | POA: Diagnosis not present

## 2016-11-08 DIAGNOSIS — M1990 Unspecified osteoarthritis, unspecified site: Secondary | ICD-10-CM | POA: Diagnosis not present

## 2016-11-08 DIAGNOSIS — Z85038 Personal history of other malignant neoplasm of large intestine: Secondary | ICD-10-CM | POA: Diagnosis not present

## 2016-11-08 DIAGNOSIS — M810 Age-related osteoporosis without current pathological fracture: Secondary | ICD-10-CM | POA: Diagnosis not present

## 2016-11-08 DIAGNOSIS — Z96641 Presence of right artificial hip joint: Secondary | ICD-10-CM | POA: Diagnosis not present

## 2016-11-08 DIAGNOSIS — E039 Hypothyroidism, unspecified: Secondary | ICD-10-CM | POA: Diagnosis not present

## 2016-11-12 DIAGNOSIS — Z96641 Presence of right artificial hip joint: Secondary | ICD-10-CM | POA: Diagnosis not present

## 2016-11-12 DIAGNOSIS — H9190 Unspecified hearing loss, unspecified ear: Secondary | ICD-10-CM | POA: Diagnosis not present

## 2016-11-12 DIAGNOSIS — S72141K Displaced intertrochanteric fracture of right femur, subsequent encounter for closed fracture with nonunion: Secondary | ICD-10-CM | POA: Diagnosis not present

## 2016-11-12 DIAGNOSIS — Z85038 Personal history of other malignant neoplasm of large intestine: Secondary | ICD-10-CM | POA: Diagnosis not present

## 2016-11-12 DIAGNOSIS — E039 Hypothyroidism, unspecified: Secondary | ICD-10-CM | POA: Diagnosis not present

## 2016-11-12 DIAGNOSIS — M810 Age-related osteoporosis without current pathological fracture: Secondary | ICD-10-CM | POA: Diagnosis not present

## 2016-11-12 DIAGNOSIS — M1990 Unspecified osteoarthritis, unspecified site: Secondary | ICD-10-CM | POA: Diagnosis not present

## 2016-11-14 DIAGNOSIS — Z85038 Personal history of other malignant neoplasm of large intestine: Secondary | ICD-10-CM | POA: Diagnosis not present

## 2016-11-14 DIAGNOSIS — S72141K Displaced intertrochanteric fracture of right femur, subsequent encounter for closed fracture with nonunion: Secondary | ICD-10-CM | POA: Diagnosis not present

## 2016-11-14 DIAGNOSIS — M810 Age-related osteoporosis without current pathological fracture: Secondary | ICD-10-CM | POA: Diagnosis not present

## 2016-11-14 DIAGNOSIS — H9190 Unspecified hearing loss, unspecified ear: Secondary | ICD-10-CM | POA: Diagnosis not present

## 2016-11-14 DIAGNOSIS — E039 Hypothyroidism, unspecified: Secondary | ICD-10-CM | POA: Diagnosis not present

## 2016-11-14 DIAGNOSIS — Z96641 Presence of right artificial hip joint: Secondary | ICD-10-CM | POA: Diagnosis not present

## 2016-11-14 DIAGNOSIS — M1990 Unspecified osteoarthritis, unspecified site: Secondary | ICD-10-CM | POA: Diagnosis not present

## 2016-11-19 DIAGNOSIS — Z96641 Presence of right artificial hip joint: Secondary | ICD-10-CM | POA: Diagnosis not present

## 2016-11-19 DIAGNOSIS — H9190 Unspecified hearing loss, unspecified ear: Secondary | ICD-10-CM | POA: Diagnosis not present

## 2016-11-19 DIAGNOSIS — S72141K Displaced intertrochanteric fracture of right femur, subsequent encounter for closed fracture with nonunion: Secondary | ICD-10-CM | POA: Diagnosis not present

## 2016-11-19 DIAGNOSIS — E039 Hypothyroidism, unspecified: Secondary | ICD-10-CM | POA: Diagnosis not present

## 2016-11-19 DIAGNOSIS — M1990 Unspecified osteoarthritis, unspecified site: Secondary | ICD-10-CM | POA: Diagnosis not present

## 2016-11-19 DIAGNOSIS — M810 Age-related osteoporosis without current pathological fracture: Secondary | ICD-10-CM | POA: Diagnosis not present

## 2016-11-19 DIAGNOSIS — Z85038 Personal history of other malignant neoplasm of large intestine: Secondary | ICD-10-CM | POA: Diagnosis not present

## 2016-11-21 DIAGNOSIS — E039 Hypothyroidism, unspecified: Secondary | ICD-10-CM | POA: Diagnosis not present

## 2016-11-21 DIAGNOSIS — M1990 Unspecified osteoarthritis, unspecified site: Secondary | ICD-10-CM | POA: Diagnosis not present

## 2016-11-21 DIAGNOSIS — Z96641 Presence of right artificial hip joint: Secondary | ICD-10-CM | POA: Diagnosis not present

## 2016-11-21 DIAGNOSIS — S72141K Displaced intertrochanteric fracture of right femur, subsequent encounter for closed fracture with nonunion: Secondary | ICD-10-CM | POA: Diagnosis not present

## 2016-11-21 DIAGNOSIS — M810 Age-related osteoporosis without current pathological fracture: Secondary | ICD-10-CM | POA: Diagnosis not present

## 2016-11-21 DIAGNOSIS — Z85038 Personal history of other malignant neoplasm of large intestine: Secondary | ICD-10-CM | POA: Diagnosis not present

## 2016-11-21 DIAGNOSIS — H9190 Unspecified hearing loss, unspecified ear: Secondary | ICD-10-CM | POA: Diagnosis not present

## 2016-11-22 DIAGNOSIS — S72009A Fracture of unspecified part of neck of unspecified femur, initial encounter for closed fracture: Secondary | ICD-10-CM | POA: Diagnosis not present

## 2016-11-22 DIAGNOSIS — M545 Low back pain: Secondary | ICD-10-CM | POA: Diagnosis not present

## 2016-11-22 DIAGNOSIS — G8929 Other chronic pain: Secondary | ICD-10-CM | POA: Diagnosis not present

## 2016-11-22 DIAGNOSIS — S72143A Displaced intertrochanteric fracture of unspecified femur, initial encounter for closed fracture: Secondary | ICD-10-CM | POA: Diagnosis not present

## 2016-11-27 DIAGNOSIS — S72141K Displaced intertrochanteric fracture of right femur, subsequent encounter for closed fracture with nonunion: Secondary | ICD-10-CM | POA: Diagnosis not present

## 2016-11-27 DIAGNOSIS — E039 Hypothyroidism, unspecified: Secondary | ICD-10-CM | POA: Diagnosis not present

## 2016-11-27 DIAGNOSIS — Z85038 Personal history of other malignant neoplasm of large intestine: Secondary | ICD-10-CM | POA: Diagnosis not present

## 2016-11-27 DIAGNOSIS — M810 Age-related osteoporosis without current pathological fracture: Secondary | ICD-10-CM | POA: Diagnosis not present

## 2016-11-27 DIAGNOSIS — Z96641 Presence of right artificial hip joint: Secondary | ICD-10-CM | POA: Diagnosis not present

## 2016-11-27 DIAGNOSIS — H9190 Unspecified hearing loss, unspecified ear: Secondary | ICD-10-CM | POA: Diagnosis not present

## 2016-11-27 DIAGNOSIS — M1990 Unspecified osteoarthritis, unspecified site: Secondary | ICD-10-CM | POA: Diagnosis not present

## 2016-12-04 DIAGNOSIS — Z96641 Presence of right artificial hip joint: Secondary | ICD-10-CM | POA: Diagnosis not present

## 2016-12-04 DIAGNOSIS — M810 Age-related osteoporosis without current pathological fracture: Secondary | ICD-10-CM | POA: Diagnosis not present

## 2016-12-04 DIAGNOSIS — H9190 Unspecified hearing loss, unspecified ear: Secondary | ICD-10-CM | POA: Diagnosis not present

## 2016-12-04 DIAGNOSIS — M1990 Unspecified osteoarthritis, unspecified site: Secondary | ICD-10-CM | POA: Diagnosis not present

## 2016-12-04 DIAGNOSIS — S72141K Displaced intertrochanteric fracture of right femur, subsequent encounter for closed fracture with nonunion: Secondary | ICD-10-CM | POA: Diagnosis not present

## 2016-12-04 DIAGNOSIS — E039 Hypothyroidism, unspecified: Secondary | ICD-10-CM | POA: Diagnosis not present

## 2016-12-04 DIAGNOSIS — Z85038 Personal history of other malignant neoplasm of large intestine: Secondary | ICD-10-CM | POA: Diagnosis not present

## 2016-12-06 DIAGNOSIS — Z96649 Presence of unspecified artificial hip joint: Secondary | ICD-10-CM | POA: Diagnosis not present

## 2016-12-06 DIAGNOSIS — M23203 Derangement of unspecified medial meniscus due to old tear or injury, right knee: Secondary | ICD-10-CM | POA: Diagnosis not present

## 2016-12-06 DIAGNOSIS — S72141D Displaced intertrochanteric fracture of right femur, subsequent encounter for closed fracture with routine healing: Secondary | ICD-10-CM | POA: Diagnosis not present

## 2016-12-06 DIAGNOSIS — Z96641 Presence of right artificial hip joint: Secondary | ICD-10-CM | POA: Diagnosis not present

## 2016-12-12 DIAGNOSIS — M23203 Derangement of unspecified medial meniscus due to old tear or injury, right knee: Secondary | ICD-10-CM | POA: Diagnosis not present

## 2016-12-12 DIAGNOSIS — S72141D Displaced intertrochanteric fracture of right femur, subsequent encounter for closed fracture with routine healing: Secondary | ICD-10-CM | POA: Diagnosis not present

## 2016-12-12 DIAGNOSIS — Z96641 Presence of right artificial hip joint: Secondary | ICD-10-CM | POA: Diagnosis not present

## 2016-12-22 DIAGNOSIS — M545 Low back pain: Secondary | ICD-10-CM | POA: Diagnosis not present

## 2016-12-22 DIAGNOSIS — G8929 Other chronic pain: Secondary | ICD-10-CM | POA: Diagnosis not present

## 2016-12-22 DIAGNOSIS — S72143A Displaced intertrochanteric fracture of unspecified femur, initial encounter for closed fracture: Secondary | ICD-10-CM | POA: Diagnosis not present

## 2016-12-22 DIAGNOSIS — S72009A Fracture of unspecified part of neck of unspecified femur, initial encounter for closed fracture: Secondary | ICD-10-CM | POA: Diagnosis not present

## 2016-12-23 DIAGNOSIS — E890 Postprocedural hypothyroidism: Secondary | ICD-10-CM | POA: Diagnosis not present

## 2016-12-23 DIAGNOSIS — Z131 Encounter for screening for diabetes mellitus: Secondary | ICD-10-CM | POA: Diagnosis not present

## 2016-12-30 DIAGNOSIS — Z0000 Encounter for general adult medical examination without abnormal findings: Secondary | ICD-10-CM | POA: Diagnosis not present

## 2016-12-30 DIAGNOSIS — M810 Age-related osteoporosis without current pathological fracture: Secondary | ICD-10-CM | POA: Diagnosis not present

## 2016-12-30 DIAGNOSIS — D649 Anemia, unspecified: Secondary | ICD-10-CM | POA: Diagnosis not present

## 2016-12-30 DIAGNOSIS — E890 Postprocedural hypothyroidism: Secondary | ICD-10-CM | POA: Diagnosis not present

## 2017-01-22 DIAGNOSIS — S72143A Displaced intertrochanteric fracture of unspecified femur, initial encounter for closed fracture: Secondary | ICD-10-CM | POA: Diagnosis not present

## 2017-01-22 DIAGNOSIS — M545 Low back pain: Secondary | ICD-10-CM | POA: Diagnosis not present

## 2017-01-22 DIAGNOSIS — G8929 Other chronic pain: Secondary | ICD-10-CM | POA: Diagnosis not present

## 2017-01-22 DIAGNOSIS — S72009A Fracture of unspecified part of neck of unspecified femur, initial encounter for closed fracture: Secondary | ICD-10-CM | POA: Diagnosis not present

## 2017-02-21 DIAGNOSIS — S72143A Displaced intertrochanteric fracture of unspecified femur, initial encounter for closed fracture: Secondary | ICD-10-CM | POA: Diagnosis not present

## 2017-02-21 DIAGNOSIS — M545 Low back pain: Secondary | ICD-10-CM | POA: Diagnosis not present

## 2017-02-21 DIAGNOSIS — S72009A Fracture of unspecified part of neck of unspecified femur, initial encounter for closed fracture: Secondary | ICD-10-CM | POA: Diagnosis not present

## 2017-02-21 DIAGNOSIS — G8929 Other chronic pain: Secondary | ICD-10-CM | POA: Diagnosis not present

## 2017-03-24 DIAGNOSIS — S72009A Fracture of unspecified part of neck of unspecified femur, initial encounter for closed fracture: Secondary | ICD-10-CM | POA: Diagnosis not present

## 2017-03-24 DIAGNOSIS — S72143A Displaced intertrochanteric fracture of unspecified femur, initial encounter for closed fracture: Secondary | ICD-10-CM | POA: Diagnosis not present

## 2017-03-24 DIAGNOSIS — G8929 Other chronic pain: Secondary | ICD-10-CM | POA: Diagnosis not present

## 2017-03-24 DIAGNOSIS — M545 Low back pain: Secondary | ICD-10-CM | POA: Diagnosis not present

## 2017-04-24 DIAGNOSIS — S72009A Fracture of unspecified part of neck of unspecified femur, initial encounter for closed fracture: Secondary | ICD-10-CM | POA: Diagnosis not present

## 2017-04-24 DIAGNOSIS — S72143A Displaced intertrochanteric fracture of unspecified femur, initial encounter for closed fracture: Secondary | ICD-10-CM | POA: Diagnosis not present

## 2017-04-24 DIAGNOSIS — M545 Low back pain: Secondary | ICD-10-CM | POA: Diagnosis not present

## 2017-04-24 DIAGNOSIS — G8929 Other chronic pain: Secondary | ICD-10-CM | POA: Diagnosis not present

## 2017-05-22 DIAGNOSIS — M545 Low back pain: Secondary | ICD-10-CM | POA: Diagnosis not present

## 2017-05-22 DIAGNOSIS — S72009A Fracture of unspecified part of neck of unspecified femur, initial encounter for closed fracture: Secondary | ICD-10-CM | POA: Diagnosis not present

## 2017-05-22 DIAGNOSIS — S72143A Displaced intertrochanteric fracture of unspecified femur, initial encounter for closed fracture: Secondary | ICD-10-CM | POA: Diagnosis not present

## 2017-05-22 DIAGNOSIS — G8929 Other chronic pain: Secondary | ICD-10-CM | POA: Diagnosis not present

## 2017-06-22 DIAGNOSIS — M545 Low back pain: Secondary | ICD-10-CM | POA: Diagnosis not present

## 2017-06-22 DIAGNOSIS — G8929 Other chronic pain: Secondary | ICD-10-CM | POA: Diagnosis not present

## 2017-06-22 DIAGNOSIS — S72009A Fracture of unspecified part of neck of unspecified femur, initial encounter for closed fracture: Secondary | ICD-10-CM | POA: Diagnosis not present

## 2017-06-22 DIAGNOSIS — S72143A Displaced intertrochanteric fracture of unspecified femur, initial encounter for closed fracture: Secondary | ICD-10-CM | POA: Diagnosis not present

## 2017-06-27 DIAGNOSIS — E890 Postprocedural hypothyroidism: Secondary | ICD-10-CM | POA: Diagnosis not present

## 2017-06-27 DIAGNOSIS — D649 Anemia, unspecified: Secondary | ICD-10-CM | POA: Diagnosis not present

## 2017-07-05 DIAGNOSIS — N390 Urinary tract infection, site not specified: Secondary | ICD-10-CM | POA: Diagnosis not present

## 2017-07-11 DIAGNOSIS — R300 Dysuria: Secondary | ICD-10-CM | POA: Diagnosis not present

## 2017-07-11 DIAGNOSIS — E890 Postprocedural hypothyroidism: Secondary | ICD-10-CM | POA: Diagnosis not present

## 2017-07-21 DIAGNOSIS — R309 Painful micturition, unspecified: Secondary | ICD-10-CM | POA: Diagnosis not present

## 2017-07-21 DIAGNOSIS — R8290 Unspecified abnormal findings in urine: Secondary | ICD-10-CM | POA: Diagnosis not present

## 2017-07-22 DIAGNOSIS — S72143A Displaced intertrochanteric fracture of unspecified femur, initial encounter for closed fracture: Secondary | ICD-10-CM | POA: Diagnosis not present

## 2017-07-22 DIAGNOSIS — M545 Low back pain: Secondary | ICD-10-CM | POA: Diagnosis not present

## 2017-07-22 DIAGNOSIS — S72009A Fracture of unspecified part of neck of unspecified femur, initial encounter for closed fracture: Secondary | ICD-10-CM | POA: Diagnosis not present

## 2017-07-22 DIAGNOSIS — G8929 Other chronic pain: Secondary | ICD-10-CM | POA: Diagnosis not present

## 2017-08-05 DIAGNOSIS — H52229 Regular astigmatism, unspecified eye: Secondary | ICD-10-CM | POA: Diagnosis not present

## 2017-08-05 DIAGNOSIS — Z961 Presence of intraocular lens: Secondary | ICD-10-CM | POA: Diagnosis not present

## 2017-08-05 DIAGNOSIS — H26493 Other secondary cataract, bilateral: Secondary | ICD-10-CM | POA: Diagnosis not present

## 2017-08-22 DIAGNOSIS — G8929 Other chronic pain: Secondary | ICD-10-CM | POA: Diagnosis not present

## 2017-08-22 DIAGNOSIS — M545 Low back pain: Secondary | ICD-10-CM | POA: Diagnosis not present

## 2017-08-22 DIAGNOSIS — S72009A Fracture of unspecified part of neck of unspecified femur, initial encounter for closed fracture: Secondary | ICD-10-CM | POA: Diagnosis not present

## 2017-08-22 DIAGNOSIS — S72143A Displaced intertrochanteric fracture of unspecified femur, initial encounter for closed fracture: Secondary | ICD-10-CM | POA: Diagnosis not present

## 2017-09-21 DIAGNOSIS — M545 Low back pain: Secondary | ICD-10-CM | POA: Diagnosis not present

## 2017-09-21 DIAGNOSIS — S72009A Fracture of unspecified part of neck of unspecified femur, initial encounter for closed fracture: Secondary | ICD-10-CM | POA: Diagnosis not present

## 2017-09-21 DIAGNOSIS — G8929 Other chronic pain: Secondary | ICD-10-CM | POA: Diagnosis not present

## 2017-09-21 DIAGNOSIS — S72143A Displaced intertrochanteric fracture of unspecified femur, initial encounter for closed fracture: Secondary | ICD-10-CM | POA: Diagnosis not present

## 2017-10-10 DIAGNOSIS — R8290 Unspecified abnormal findings in urine: Secondary | ICD-10-CM | POA: Diagnosis not present

## 2017-10-10 DIAGNOSIS — N390 Urinary tract infection, site not specified: Secondary | ICD-10-CM | POA: Diagnosis not present

## 2017-10-10 DIAGNOSIS — E890 Postprocedural hypothyroidism: Secondary | ICD-10-CM | POA: Diagnosis not present

## 2017-10-14 DIAGNOSIS — L03119 Cellulitis of unspecified part of limb: Secondary | ICD-10-CM | POA: Diagnosis not present

## 2017-10-14 DIAGNOSIS — E890 Postprocedural hypothyroidism: Secondary | ICD-10-CM | POA: Diagnosis not present

## 2017-10-14 DIAGNOSIS — L02419 Cutaneous abscess of limb, unspecified: Secondary | ICD-10-CM | POA: Diagnosis not present

## 2017-10-14 DIAGNOSIS — M25552 Pain in left hip: Secondary | ICD-10-CM | POA: Diagnosis not present

## 2017-10-22 DIAGNOSIS — S72143A Displaced intertrochanteric fracture of unspecified femur, initial encounter for closed fracture: Secondary | ICD-10-CM | POA: Diagnosis not present

## 2017-10-22 DIAGNOSIS — M545 Low back pain: Secondary | ICD-10-CM | POA: Diagnosis not present

## 2017-10-22 DIAGNOSIS — S72009A Fracture of unspecified part of neck of unspecified femur, initial encounter for closed fracture: Secondary | ICD-10-CM | POA: Diagnosis not present

## 2017-10-22 DIAGNOSIS — G8929 Other chronic pain: Secondary | ICD-10-CM | POA: Diagnosis not present

## 2017-10-24 ENCOUNTER — Ambulatory Visit
Admission: RE | Admit: 2017-10-24 | Discharge: 2017-10-24 | Disposition: A | Discharge status: Home / Self Care | Payer: Medicare HMO | Source: Ambulatory Visit | Attending: Surgery | Admitting: Surgery

## 2017-10-24 ENCOUNTER — Other Ambulatory Visit: Payer: Self-pay | Admitting: Surgery

## 2017-10-24 DIAGNOSIS — M47816 Spondylosis without myelopathy or radiculopathy, lumbar region: Secondary | ICD-10-CM | POA: Insufficient documentation

## 2017-10-24 DIAGNOSIS — I82442 Acute embolism and thrombosis of left tibial vein: Secondary | ICD-10-CM | POA: Diagnosis not present

## 2017-10-24 DIAGNOSIS — M810 Age-related osteoporosis without current pathological fracture: Secondary | ICD-10-CM | POA: Diagnosis not present

## 2017-10-24 DIAGNOSIS — M7989 Other specified soft tissue disorders: Secondary | ICD-10-CM

## 2017-11-18 DIAGNOSIS — H26493 Other secondary cataract, bilateral: Secondary | ICD-10-CM | POA: Diagnosis not present

## 2017-11-18 DIAGNOSIS — H353132 Nonexudative age-related macular degeneration, bilateral, intermediate dry stage: Secondary | ICD-10-CM | POA: Diagnosis not present

## 2017-11-18 DIAGNOSIS — Z961 Presence of intraocular lens: Secondary | ICD-10-CM | POA: Diagnosis not present

## 2017-11-18 DIAGNOSIS — H26491 Other secondary cataract, right eye: Secondary | ICD-10-CM | POA: Diagnosis not present

## 2017-11-18 DIAGNOSIS — H18413 Arcus senilis, bilateral: Secondary | ICD-10-CM | POA: Diagnosis not present

## 2017-12-09 DIAGNOSIS — H26493 Other secondary cataract, bilateral: Secondary | ICD-10-CM | POA: Diagnosis not present

## 2017-12-09 DIAGNOSIS — H26492 Other secondary cataract, left eye: Secondary | ICD-10-CM | POA: Diagnosis not present

## 2018-01-09 DIAGNOSIS — R300 Dysuria: Secondary | ICD-10-CM | POA: Diagnosis not present

## 2018-01-09 DIAGNOSIS — E890 Postprocedural hypothyroidism: Secondary | ICD-10-CM | POA: Diagnosis not present

## 2018-01-09 DIAGNOSIS — R350 Frequency of micturition: Secondary | ICD-10-CM | POA: Diagnosis not present

## 2018-01-20 DIAGNOSIS — R600 Localized edema: Secondary | ICD-10-CM | POA: Diagnosis not present

## 2018-01-20 DIAGNOSIS — R918 Other nonspecific abnormal finding of lung field: Secondary | ICD-10-CM | POA: Diagnosis not present

## 2018-01-20 DIAGNOSIS — E890 Postprocedural hypothyroidism: Secondary | ICD-10-CM | POA: Diagnosis not present

## 2018-01-20 DIAGNOSIS — N390 Urinary tract infection, site not specified: Secondary | ICD-10-CM | POA: Diagnosis not present

## 2018-03-23 DIAGNOSIS — Z0000 Encounter for general adult medical examination without abnormal findings: Secondary | ICD-10-CM | POA: Diagnosis not present

## 2018-03-23 DIAGNOSIS — E890 Postprocedural hypothyroidism: Secondary | ICD-10-CM | POA: Diagnosis not present

## 2018-03-23 DIAGNOSIS — N183 Chronic kidney disease, stage 3 (moderate): Secondary | ICD-10-CM | POA: Diagnosis not present

## 2018-03-26 DIAGNOSIS — L820 Inflamed seborrheic keratosis: Secondary | ICD-10-CM | POA: Diagnosis not present

## 2018-03-26 DIAGNOSIS — L821 Other seborrheic keratosis: Secondary | ICD-10-CM | POA: Diagnosis not present

## 2018-05-14 ENCOUNTER — Emergency Department: Payer: Medicare HMO

## 2018-05-14 ENCOUNTER — Inpatient Hospital Stay
Admission: EM | Admit: 2018-05-14 | Discharge: 2018-05-18 | DRG: 482 | Disposition: A | Discharge status: Home Health | Payer: Medicare HMO | Attending: Internal Medicine | Admitting: Internal Medicine

## 2018-05-14 ENCOUNTER — Other Ambulatory Visit: Payer: Self-pay

## 2018-05-14 DIAGNOSIS — Z8249 Family history of ischemic heart disease and other diseases of the circulatory system: Secondary | ICD-10-CM | POA: Diagnosis not present

## 2018-05-14 DIAGNOSIS — M2550 Pain in unspecified joint: Secondary | ICD-10-CM | POA: Diagnosis not present

## 2018-05-14 DIAGNOSIS — K219 Gastro-esophageal reflux disease without esophagitis: Secondary | ICD-10-CM | POA: Diagnosis present

## 2018-05-14 DIAGNOSIS — S299XXA Unspecified injury of thorax, initial encounter: Secondary | ICD-10-CM | POA: Diagnosis not present

## 2018-05-14 DIAGNOSIS — Z86718 Personal history of other venous thrombosis and embolism: Secondary | ICD-10-CM | POA: Diagnosis not present

## 2018-05-14 DIAGNOSIS — S72142D Displaced intertrochanteric fracture of left femur, subsequent encounter for closed fracture with routine healing: Secondary | ICD-10-CM | POA: Diagnosis not present

## 2018-05-14 DIAGNOSIS — M79662 Pain in left lower leg: Secondary | ICD-10-CM | POA: Diagnosis not present

## 2018-05-14 DIAGNOSIS — Z888 Allergy status to other drugs, medicaments and biological substances status: Secondary | ICD-10-CM | POA: Diagnosis not present

## 2018-05-14 DIAGNOSIS — H9190 Unspecified hearing loss, unspecified ear: Secondary | ICD-10-CM | POA: Diagnosis present

## 2018-05-14 DIAGNOSIS — Z85038 Personal history of other malignant neoplasm of large intestine: Secondary | ICD-10-CM

## 2018-05-14 DIAGNOSIS — S8991XA Unspecified injury of right lower leg, initial encounter: Secondary | ICD-10-CM | POA: Diagnosis not present

## 2018-05-14 DIAGNOSIS — S72092A Other fracture of head and neck of left femur, initial encounter for closed fracture: Secondary | ICD-10-CM | POA: Diagnosis not present

## 2018-05-14 DIAGNOSIS — Z7901 Long term (current) use of anticoagulants: Secondary | ICD-10-CM | POA: Diagnosis not present

## 2018-05-14 DIAGNOSIS — Z419 Encounter for procedure for purposes other than remedying health state, unspecified: Secondary | ICD-10-CM

## 2018-05-14 DIAGNOSIS — Z833 Family history of diabetes mellitus: Secondary | ICD-10-CM | POA: Diagnosis not present

## 2018-05-14 DIAGNOSIS — R404 Transient alteration of awareness: Secondary | ICD-10-CM | POA: Diagnosis not present

## 2018-05-14 DIAGNOSIS — R58 Hemorrhage, not elsewhere classified: Secondary | ICD-10-CM | POA: Diagnosis not present

## 2018-05-14 DIAGNOSIS — E890 Postprocedural hypothyroidism: Secondary | ICD-10-CM | POA: Diagnosis present

## 2018-05-14 DIAGNOSIS — W010XXA Fall on same level from slipping, tripping and stumbling without subsequent striking against object, initial encounter: Secondary | ICD-10-CM | POA: Diagnosis present

## 2018-05-14 DIAGNOSIS — S72142A Displaced intertrochanteric fracture of left femur, initial encounter for closed fracture: Secondary | ICD-10-CM | POA: Diagnosis present

## 2018-05-14 DIAGNOSIS — E039 Hypothyroidism, unspecified: Secondary | ICD-10-CM | POA: Diagnosis not present

## 2018-05-14 DIAGNOSIS — I10 Essential (primary) hypertension: Secondary | ICD-10-CM | POA: Diagnosis not present

## 2018-05-14 DIAGNOSIS — S81811A Laceration without foreign body, right lower leg, initial encounter: Secondary | ICD-10-CM | POA: Diagnosis present

## 2018-05-14 DIAGNOSIS — Y92009 Unspecified place in unspecified non-institutional (private) residence as the place of occurrence of the external cause: Secondary | ICD-10-CM | POA: Diagnosis not present

## 2018-05-14 DIAGNOSIS — D638 Anemia in other chronic diseases classified elsewhere: Secondary | ICD-10-CM | POA: Diagnosis present

## 2018-05-14 DIAGNOSIS — Z90710 Acquired absence of both cervix and uterus: Secondary | ICD-10-CM

## 2018-05-14 DIAGNOSIS — R0902 Hypoxemia: Secondary | ICD-10-CM | POA: Diagnosis not present

## 2018-05-14 DIAGNOSIS — Z7401 Bed confinement status: Secondary | ICD-10-CM | POA: Diagnosis not present

## 2018-05-14 DIAGNOSIS — M25552 Pain in left hip: Secondary | ICD-10-CM | POA: Diagnosis present

## 2018-05-14 DIAGNOSIS — S72002A Fracture of unspecified part of neck of left femur, initial encounter for closed fracture: Secondary | ICD-10-CM

## 2018-05-14 DIAGNOSIS — R52 Pain, unspecified: Secondary | ICD-10-CM | POA: Diagnosis not present

## 2018-05-14 DIAGNOSIS — M810 Age-related osteoporosis without current pathological fracture: Secondary | ICD-10-CM | POA: Diagnosis present

## 2018-05-14 DIAGNOSIS — Z79899 Other long term (current) drug therapy: Secondary | ICD-10-CM

## 2018-05-14 DIAGNOSIS — S72009A Fracture of unspecified part of neck of unspecified femur, initial encounter for closed fracture: Secondary | ICD-10-CM | POA: Diagnosis not present

## 2018-05-14 DIAGNOSIS — M79606 Pain in leg, unspecified: Secondary | ICD-10-CM

## 2018-05-14 DIAGNOSIS — R030 Elevated blood-pressure reading, without diagnosis of hypertension: Secondary | ICD-10-CM | POA: Diagnosis not present

## 2018-05-14 LAB — BASIC METABOLIC PANEL
Anion gap: 8 (ref 5–15)
BUN: 16 mg/dL (ref 8–23)
CO2: 26 mmol/L (ref 22–32)
Calcium: 8.4 mg/dL — ABNORMAL LOW (ref 8.9–10.3)
Chloride: 101 mmol/L (ref 98–111)
Creatinine, Ser: 0.92 mg/dL (ref 0.44–1.00)
GFR calc Af Amer: >60 mL/min (ref >60)
GFR, EST NON AFRICAN AMERICAN: 53 mL/min — AB (ref >60)
Glucose, Bld: 144 mg/dL — ABNORMAL HIGH (ref 70–99)
Potassium: 3.7 mmol/L (ref 3.5–5.1)
Sodium: 135 mmol/L (ref 135–145)

## 2018-05-14 LAB — CBC WITH DIFFERENTIAL/PLATELET
ABS IMMATURE GRANULOCYTES: 0.05 10*3/uL (ref 0.00–0.07)
BASOS PCT: 1 %
Basophils Absolute: 0.1 10*3/uL (ref 0.0–0.1)
Eosinophils Absolute: 0.2 10*3/uL (ref 0.0–0.5)
Eosinophils Relative: 2 %
HCT: 35.8 % — ABNORMAL LOW (ref 36.0–46.0)
HEMOGLOBIN: 12 g/dL (ref 12.0–15.0)
IMMATURE GRANULOCYTES: 1 %
LYMPHS ABS: 3.2 10*3/uL (ref 0.7–4.0)
LYMPHS PCT: 30 %
MCH: 32.7 pg (ref 26.0–34.0)
MCHC: 33.5 g/dL (ref 30.0–36.0)
MCV: 97.5 fL (ref 80.0–100.0)
Monocytes Absolute: 0.8 10*3/uL (ref 0.1–1.0)
Monocytes Relative: 8 %
NEUTROS ABS: 6.2 10*3/uL (ref 1.7–7.7)
NEUTROS PCT: 58 %
PLATELETS: 247 10*3/uL (ref 150–400)
RBC: 3.67 MIL/uL — ABNORMAL LOW (ref 3.87–5.11)
RDW: 13.6 % (ref 11.5–15.5)
WBC: 10.5 10*3/uL (ref 4.0–10.5)
nRBC: 0 % (ref 0.0–0.2)

## 2018-05-14 LAB — PROTIME-INR
INR: 1.1 (ref 0.8–1.2)
Prothrombin Time: 14 seconds (ref 11.4–15.2)

## 2018-05-14 MED ORDER — BACITRACIN ZINC 500 UNIT/GM EX OINT
TOPICAL_OINTMENT | Freq: Once | CUTANEOUS | Status: AC
  Administered 2018-05-14: via TOPICAL
  Filled 2018-05-14: qty 0.9

## 2018-05-14 MED ORDER — ONDANSETRON HCL 4 MG/2ML IJ SOLN
4.0000 mg | Freq: Once | INTRAMUSCULAR | Status: AC
  Administered 2018-05-14: 4 mg via INTRAVENOUS
  Filled 2018-05-14: qty 2

## 2018-05-14 MED ORDER — MORPHINE SULFATE (PF) 4 MG/ML IV SOLN
4.0000 mg | Freq: Once | INTRAVENOUS | Status: AC
  Administered 2018-05-14: 4 mg via INTRAVENOUS
  Filled 2018-05-14: qty 1

## 2018-05-14 NOTE — ED Provider Notes (Signed)
[facility] Emergency Department Provider Note ____________________________________________   First MD Initiated Contact with Patient 05/14/18 2208     (approximate)  I have reviewed the triage vital signs and the nursing notes.   HISTORY  Chief Complaint Fall  Level 5 caveat: History of present illness limited due to hearing impairment  HPI Christine [name] is a 85 y.o. female with PMH as noted below who presents with left hip injury, acute onset when she was putting on her robe and fell backwards from standing height.  She also has a skin tear on the right lower leg but no head injury or LOC.  Past Medical History:  Diagnosis Date  . Cancer ([place])    colon  . Carpal tunnel syndrome   . Complication of anesthesia    hard time waking up after thyroidectomy  . DJD (degenerative joint disease)   . Family history of adverse reaction to anesthesia    daughter PONV  . Hearing loss   . Hypothyroidism   . Osteoporosis   . Wears glasses     Patient Active Problem List   Diagnosis Date Noted  . Pressure injury of skin 10/25/2016  . 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 Hip fracture ([place]) 01/09/2015    Past Surgical History:  Procedure Laterality Date  . ABDOMINAL HYSTERECTOMY    . COLON RESECTION  1995   for colon cancer  . EYE SURGERY Bilateral    cataract surgery  . INTRAMEDULLARY (IM) NAIL INTERTROCHANTERIC Right 01/09/2015   Procedure: INTRAMEDULLARY (IM) NAIL INTERTROCHANTRIC;  Surgeon: Corky Mull, MD;  Location: [facility] ORS;  Service: Orthopedics;  Laterality: Right;  . Lung surgery     as a child for empyema  . OOPHORECTOMY    . THYROID SURGERY  1967   for multinodular goitre  . TOTAL HIP REVISION Right 10/24/2016   Procedure: TOTAL HIP REVISION AND HARDWARE REMOVAL;  Surgeon: Corky Mull, MD;  Location: [facility] ORS;  Service: Orthopedics;  Laterality: Right;    Prior to Admission medications   Medication Sig Start  Date End Date Taking? Authorizing Provider  calcium carbonate (OSCAL) 1500 (600 Ca) MG TABS tablet Take 600 mg by mouth 2 (two) times daily with a meal.    [provider]  Cholecalciferol (KP VITAMIN D3) 2000 units CAPS Take 2,000 Units by mouth every other day.    [provider]  cholecalciferol (VITAMIN D) 1000 units tablet Take 1,000 Units by mouth every other day.    [provider]  enoxaparin (LOVENOX) 40 MG/0.4ML injection Inject 0.4 mLs (40 mg total) into the skin daily. 10/25/16   Lattie Corns, PA-C  hydrochlorothiazide (HYDRODIURIL) 12.5 MG tablet Take 12.5 mg by mouth daily as needed. For fluid retention. 07/18/16   [provider]  levothyroxine (SYNTHROID, LEVOTHROID) 100 MCG tablet Take 100 mcg by mouth daily before breakfast.     [provider]  LUBRICANT EYE DROPS 0.4-0.3 % SOLN Place 1 drop into both eyes 3 (three) times daily as needed. For dry eyes. 07/19/16   [provider]  oxyCODONE (OXY IR/ROXICODONE) 5 MG immediate release tablet Take 1-2 tablets (5-10 mg total) by mouth every 4 (four) hours as needed for breakthrough pain. 10/25/16   Lattie Corns, PA-C  pantoprazole (PROTONIX) 40 MG tablet Take 1 tablet (40 mg total) by mouth 2 (two) times daily. Patient taking differently: Take 40 mg by mouth 2 (two) times daily as needed.  01/13/15   Glendon Axe, MD  raloxifene (EVISTA) 60 MG tablet Take 60 mg by mouth daily with lunch. 09/21/16   [provider]  traMADol (ULTRAM) 50 MG tablet Take 1 tablet (50 mg total) by mouth every 6 (six) hours as needed. 10/25/16   Lattie Corns, PA-C    Allergies Adhesive [tape] and Tapentadol  Family History  Problem Relation Age of Onset  . Heart disease Father   . Diabetes Sister   . Cancer Sister   . Diabetes Brother   . Cancer Brother   . Cancer Mother     Social History Social History   Tobacco Use  . Smoking status: Never Smoker  . Smokeless  tobacco: Never Used  Substance Use Topics  . Alcohol use: No  . Drug use: No    Review of Systems Level 5 caveat: Review of systems limited due to hearing impairment Constitutional: No fever. ENT: No neck pain. Cardiovascular: Denies chest pain. Respiratory: Denies shortness of breath. Gastrointestinal: No vomiting. Musculoskeletal: Negative for back pain.  Positive for left hip injury. Skin: Positive for skin tear to right lower leg. Neurological: Negative for head injury.   ____________________________________________   PHYSICAL EXAM:  VITAL SIGNS: ED Triage Vitals  Enc Vitals Group     BP 05/14/18 2203 (!) 190/73     Pulse Rate 05/14/18 2203 92     Resp 05/14/18 2207 (!) 24     Temp 05/14/18 2207 97.6 F (36.4 C)     Temp Source 05/14/18 2207 Oral     SpO2 05/14/18 2203 96 %     Weight 05/14/18 2202 130 lb (59 kg)     Height 05/14/18 2202 5\' 7"  (1.702 m)     Head Circumference --      Peak Flow --      Pain Score 05/14/18 2206 10     Pain Loc --      Pain Edu? --      Excl. in [place]? --     Constitutional: Alert and oriented.  Uncomfortable appearing but in no acute distress. Eyes: Conjunctivae are normal.  Head: Atraumatic. Nose: No congestion/rhinnorhea. Mouth/Throat: Mucous membranes are moist.   Neck: Normal range of motion.  Cardiovascular: Normal rate, regular rhythm.Good peripheral circulation. Respiratory: Normal respiratory effort.  Gastrointestinal: No distention.  Musculoskeletal: Shortening and external rotation of left leg.  Pain on range of motion of left hip. Neurologic:  Normal speech and language.  Motor intact in all extremities. Skin:  Skin is warm and dry. No rash noted.  5 cm superficial skin tear to right anterior lower leg just distal to the knee. Psychiatric: Calm and cooperative.  ____________________________________________   LABS (all labs ordered are listed, but only abnormal results are displayed)  Labs Reviewed  CBC WITH  DIFFERENTIAL/PLATELET - Abnormal; Notable for the following components:      Result Value   RBC 3.67 (*)    HCT 35.8 (*)    All other components within normal limits  PROTIME-INR  TYPE AND SCREEN   ____________________________________________  EKG  ED ECG REPORT I, Arta Silence, the attending physician, personally viewed and interpreted this ECG.  Date: 05/14/2018 EKG Time: 2207 Rate: 83 Rhythm: normal sinus rhythm QRS Axis: Right axis Intervals: normal ST/T Wave abnormalities: normal Narrative Interpretation: no evidence of acute ischemia  ____________________________________________  RADIOLOGY  XR L hip: Intertrochanteric fracture XR R tib/fib: No acute fracture CXR: No focal infiltrate  ____________________________________________   PROCEDURES  Procedure(s) performed:  No  Procedures  Critical Care performed: No ____________________________________________   INITIAL IMPRESSION / ASSESSMENT AND PLAN / ED COURSE  Pertinent labs & imaging results that were available during my care of the patient were reviewed by me and considered in my medical decision making (see chart for details).  85 year old female with PMH as noted above presents with left hip injury as well as a right lower leg skin tear after an apparent mechanical fall from standing height.  There is no evidence of head injury or other injuries.  On exam the patient does have shortening and external rotation of the left leg, and pain on range of motion of the left hip.  I am concerned for acute hip fracture.  The right lower leg skin tear does not require repair but will need a sterile dressing.  We will obtain x-rays as well as lab work-up for medical clearance.  ----------------------------------------- 11:19 PM on 05/14/2018 -----------------------------------------  I consulted Dr. Roland Rack from orthopedics about the intertrochanteric fracture seen on x-ray.  Since the patient is on Xarelto,  he will evaluate her for surgery tomorrow.  I signed the patient out to the hospitalist Dr. Posey Pronto.  I discussed the results of the work-up and the plan of care with the patient and her family. ____________________________________________   FINAL CLINICAL IMPRESSION(S) / ED DIAGNOSES  Final diagnoses:  Closed fracture of left hip, initial encounter ([place])      NEW MEDICATIONS STARTED DURING THIS VISIT:  New Prescriptions   No medications on file     Note:  This document was prepared using Dragon voice recognition software and may include unintentional dictation errors.    Arta Silence, MD 05/14/18 2320

## 2018-05-14 NOTE — ED Triage Notes (Signed)
Pt arrived via [facility] from home with c/o fall. EMS states pt was with daughter and was attempting to put on robe and fell backwards. EMS states that pt has shortening and rotation on left leg. EMS states pt has skin tear on right leg. EMS states no LOC or hitting head. Pt given 75 mcg of fentanyl en route with minimal relief.

## 2018-05-15 ENCOUNTER — Inpatient Hospital Stay: Payer: Medicare HMO | Admitting: Anesthesiology

## 2018-05-15 ENCOUNTER — Inpatient Hospital Stay: Payer: Medicare HMO

## 2018-05-15 ENCOUNTER — Encounter: Payer: Self-pay | Admitting: Certified Registered Nurse Anesthetist

## 2018-05-15 ENCOUNTER — Other Ambulatory Visit: Payer: Self-pay

## 2018-05-15 ENCOUNTER — Encounter
Admission: EM | Disposition: A | Discharge status: Home Health | Payer: Self-pay | Source: Home / Self Care | Attending: Internal Medicine

## 2018-05-15 DIAGNOSIS — H9190 Unspecified hearing loss, unspecified ear: Secondary | ICD-10-CM | POA: Diagnosis present

## 2018-05-15 DIAGNOSIS — K219 Gastro-esophageal reflux disease without esophagitis: Secondary | ICD-10-CM | POA: Diagnosis present

## 2018-05-15 DIAGNOSIS — W010XXA Fall on same level from slipping, tripping and stumbling without subsequent striking against object, initial encounter: Secondary | ICD-10-CM | POA: Diagnosis present

## 2018-05-15 DIAGNOSIS — Z86718 Personal history of other venous thrombosis and embolism: Secondary | ICD-10-CM | POA: Diagnosis not present

## 2018-05-15 DIAGNOSIS — Z7901 Long term (current) use of anticoagulants: Secondary | ICD-10-CM | POA: Diagnosis not present

## 2018-05-15 DIAGNOSIS — Z833 Family history of diabetes mellitus: Secondary | ICD-10-CM | POA: Diagnosis not present

## 2018-05-15 DIAGNOSIS — Z888 Allergy status to other drugs, medicaments and biological substances status: Secondary | ICD-10-CM | POA: Diagnosis not present

## 2018-05-15 DIAGNOSIS — S72142A Displaced intertrochanteric fracture of left femur, initial encounter for closed fracture: Secondary | ICD-10-CM | POA: Diagnosis present

## 2018-05-15 DIAGNOSIS — Z8249 Family history of ischemic heart disease and other diseases of the circulatory system: Secondary | ICD-10-CM | POA: Diagnosis not present

## 2018-05-15 DIAGNOSIS — Z79899 Other long term (current) drug therapy: Secondary | ICD-10-CM | POA: Diagnosis not present

## 2018-05-15 DIAGNOSIS — D638 Anemia in other chronic diseases classified elsewhere: Secondary | ICD-10-CM | POA: Diagnosis present

## 2018-05-15 DIAGNOSIS — Z85038 Personal history of other malignant neoplasm of large intestine: Secondary | ICD-10-CM | POA: Diagnosis not present

## 2018-05-15 DIAGNOSIS — E890 Postprocedural hypothyroidism: Secondary | ICD-10-CM | POA: Diagnosis present

## 2018-05-15 DIAGNOSIS — Z90710 Acquired absence of both cervix and uterus: Secondary | ICD-10-CM | POA: Diagnosis not present

## 2018-05-15 DIAGNOSIS — Y92009 Unspecified place in unspecified non-institutional (private) residence as the place of occurrence of the external cause: Secondary | ICD-10-CM | POA: Diagnosis not present

## 2018-05-15 DIAGNOSIS — M25552 Pain in left hip: Secondary | ICD-10-CM | POA: Diagnosis present

## 2018-05-15 DIAGNOSIS — M810 Age-related osteoporosis without current pathological fracture: Secondary | ICD-10-CM | POA: Diagnosis present

## 2018-05-15 DIAGNOSIS — S81811A Laceration without foreign body, right lower leg, initial encounter: Secondary | ICD-10-CM | POA: Diagnosis present

## 2018-05-15 HISTORY — PX: INTRAMEDULLARY (IM) NAIL INTERTROCHANTERIC: SHX5875

## 2018-05-15 LAB — MRSA PCR SCREENING: MRSA by PCR: NEGATIVE

## 2018-05-15 SURGERY — FIXATION, FRACTURE, INTERTROCHANTERIC, WITH INTRAMEDULLARY ROD
Anesthesia: General | Site: Hip | Laterality: Left

## 2018-05-15 MED ORDER — ACETAMINOPHEN 500 MG PO TABS
1000.0000 mg | ORAL_TABLET | Freq: Four times a day (QID) | ORAL | Status: AC
  Administered 2018-05-15 – 2018-05-16 (×4): 1000 mg via ORAL
  Filled 2018-05-15 (×4): qty 2

## 2018-05-15 MED ORDER — TRAZODONE HCL 50 MG PO TABS
25.0000 mg | ORAL_TABLET | Freq: Every day | ORAL | Status: DC
  Administered 2018-05-15 – 2018-05-17 (×4): 25 mg via ORAL
  Filled 2018-05-15 (×4): qty 1

## 2018-05-15 MED ORDER — ONDANSETRON HCL 4 MG PO TABS: 4.0000 mg | ORAL_TABLET | Freq: Four times a day (QID) | ORAL | Status: DC | PRN

## 2018-05-15 MED ORDER — MORPHINE SULFATE (PF) 2 MG/ML IV SOLN
INTRAVENOUS | Status: AC
  Filled 2018-05-15: qty 1

## 2018-05-15 MED ORDER — LEVOTHYROXINE SODIUM 100 MCG PO TABS
100.0000 ug | ORAL_TABLET | Freq: Every day | ORAL | Status: DC
  Administered 2018-05-16 – 2018-05-18 (×3): 100 ug via ORAL
  Filled 2018-05-15 (×3): qty 1
  Filled 2018-05-15: qty 2

## 2018-05-15 MED ORDER — CEFAZOLIN SODIUM-DEXTROSE 2-3 GM-%(50ML) IV SOLR
INTRAVENOUS | Status: DC | PRN
  Administered 2018-05-15: 2 g via INTRAVENOUS

## 2018-05-15 MED ORDER — FUROSEMIDE 20 MG PO TABS: 20.0000 mg | ORAL_TABLET | ORAL | Status: DC | PRN

## 2018-05-15 MED ORDER — KETAMINE HCL 50 MG/ML IJ SOLN
INTRAMUSCULAR | Status: AC
  Filled 2018-05-15: qty 10

## 2018-05-15 MED ORDER — ACETAMINOPHEN 650 MG RE SUPP: 650.0000 mg | Freq: Four times a day (QID) | RECTAL | Status: DC | PRN

## 2018-05-15 MED ORDER — LACTATED RINGERS IV SOLN
INTRAVENOUS | Status: DC | PRN
  Administered 2018-05-15: 13:00:00 via INTRAVENOUS

## 2018-05-15 MED ORDER — SUGAMMADEX SODIUM 200 MG/2ML IV SOLN
INTRAVENOUS | Status: DC | PRN
  Administered 2018-05-15: 118 mg via INTRAVENOUS

## 2018-05-15 MED ORDER — METOCLOPRAMIDE HCL 10 MG PO TABS: 5.0000 mg | ORAL_TABLET | Freq: Three times a day (TID) | ORAL | Status: DC | PRN

## 2018-05-15 MED ORDER — CEFAZOLIN SODIUM-DEXTROSE 2-4 GM/100ML-% IV SOLN
2.0000 g | Freq: Four times a day (QID) | INTRAVENOUS | Status: AC
  Administered 2018-05-15 – 2018-05-16 (×2): 2 g via INTRAVENOUS
  Filled 2018-05-15 (×2): qty 100

## 2018-05-15 MED ORDER — OXYCODONE HCL 5 MG/5ML PO SOLN: 5.0000 mg | Freq: Once | ORAL | Status: DC | PRN

## 2018-05-15 MED ORDER — KETAMINE HCL 10 MG/ML IJ SOLN
INTRAMUSCULAR | Status: DC | PRN
  Administered 2018-05-15: 20 mg via INTRAVENOUS

## 2018-05-15 MED ORDER — FENTANYL CITRATE (PF) 100 MCG/2ML IJ SOLN: 25.0000 ug | INTRAMUSCULAR | Status: DC | PRN

## 2018-05-15 MED ORDER — CEFAZOLIN SODIUM-DEXTROSE 2-4 GM/100ML-% IV SOLN
2.0000 g | Freq: Four times a day (QID) | INTRAVENOUS | Status: DC
  Filled 2018-05-15 (×3): qty 100

## 2018-05-15 MED ORDER — RALOXIFENE HCL 60 MG PO TABS
60.0000 mg | ORAL_TABLET | Freq: Every day | ORAL | Status: DC
  Administered 2018-05-16 – 2018-05-18 (×3): 60 mg via ORAL
  Filled 2018-05-15 (×4): qty 1

## 2018-05-15 MED ORDER — DIPHENHYDRAMINE HCL 12.5 MG/5ML PO ELIX: 12.5000 mg | ORAL_SOLUTION | ORAL | Status: DC | PRN

## 2018-05-15 MED ORDER — FENTANYL CITRATE (PF) 100 MCG/2ML IJ SOLN
INTRAMUSCULAR | Status: AC
  Filled 2018-05-15: qty 2

## 2018-05-15 MED ORDER — BISACODYL 10 MG RE SUPP
10.0000 mg | Freq: Every day | RECTAL | Status: DC | PRN
  Administered 2018-05-18: 10 mg via RECTAL
  Filled 2018-05-15: qty 1

## 2018-05-15 MED ORDER — SODIUM CHLORIDE 0.9 % IV SOLN
INTRAVENOUS | Status: DC
  Administered 2018-05-15: 02:00:00 via INTRAVENOUS

## 2018-05-15 MED ORDER — VITAMIN D 25 MCG (1000 UNIT) PO TABS
2000.0000 [IU] | ORAL_TABLET | Freq: Every day | ORAL | Status: DC
  Administered 2018-05-16 – 2018-05-18 (×3): 2000 [IU] via ORAL
  Filled 2018-05-15 (×3): qty 2

## 2018-05-15 MED ORDER — HYDROMORPHONE HCL 1 MG/ML IJ SOLN: 0.2500 mg | INTRAMUSCULAR | Status: DC | PRN

## 2018-05-15 MED ORDER — MAGNESIUM HYDROXIDE 400 MG/5ML PO SUSP
30.0000 mL | Freq: Every day | ORAL | Status: DC | PRN
  Administered 2018-05-17: 30 mL via ORAL
  Filled 2018-05-15 (×2): qty 30

## 2018-05-15 MED ORDER — FLEET ENEMA 7-19 GM/118ML RE ENEM: 1.0000 | ENEMA | Freq: Once | RECTAL | Status: DC | PRN

## 2018-05-15 MED ORDER — ROCURONIUM BROMIDE 100 MG/10ML IV SOLN
INTRAVENOUS | Status: DC | PRN
  Administered 2018-05-15: 50 mg via INTRAVENOUS

## 2018-05-15 MED ORDER — MORPHINE SULFATE (PF) 2 MG/ML IV SOLN
2.0000 mg | INTRAVENOUS | Status: DC | PRN
  Administered 2018-05-15 (×3): 2 mg via INTRAVENOUS
  Filled 2018-05-15 (×2): qty 1

## 2018-05-15 MED ORDER — ONDANSETRON HCL 4 MG/2ML IJ SOLN: 4.0000 mg | Freq: Four times a day (QID) | INTRAMUSCULAR | Status: DC | PRN

## 2018-05-15 MED ORDER — SODIUM CHLORIDE 0.9 % IV SOLN
INTRAVENOUS | Status: DC
  Administered 2018-05-15: 18:00:00 via INTRAVENOUS

## 2018-05-15 MED ORDER — METOCLOPRAMIDE HCL 5 MG/ML IJ SOLN: 5.0000 mg | Freq: Three times a day (TID) | INTRAMUSCULAR | Status: DC | PRN

## 2018-05-15 MED ORDER — PROPOFOL 10 MG/ML IV BOLUS
INTRAVENOUS | Status: DC | PRN
  Administered 2018-05-15: 80 mg via INTRAVENOUS

## 2018-05-15 MED ORDER — OXYCODONE HCL 5 MG PO TABS
5.0000 mg | ORAL_TABLET | ORAL | Status: DC | PRN
  Filled 2018-05-15: qty 2

## 2018-05-15 MED ORDER — RIVAROXABAN 20 MG PO TABS
20.0000 mg | ORAL_TABLET | Freq: Every day | ORAL | Status: DC
  Administered 2018-05-16 – 2018-05-18 (×3): 20 mg via ORAL
  Filled 2018-05-15 (×3): qty 1

## 2018-05-15 MED ORDER — ACETAMINOPHEN 325 MG PO TABS: 650.0000 mg | ORAL_TABLET | Freq: Four times a day (QID) | ORAL | Status: DC | PRN

## 2018-05-15 MED ORDER — ONDANSETRON HCL 4 MG/2ML IJ SOLN
4.0000 mg | Freq: Four times a day (QID) | INTRAMUSCULAR | Status: DC | PRN
  Administered 2018-05-15 (×3): 4 mg via INTRAVENOUS
  Filled 2018-05-15 (×2): qty 2

## 2018-05-15 MED ORDER — PHENYLEPHRINE HCL 10 MG/ML IJ SOLN
INTRAMUSCULAR | Status: DC | PRN
  Administered 2018-05-15 (×2): 100 ug via INTRAVENOUS
  Administered 2018-05-15: 200 ug via INTRAVENOUS

## 2018-05-15 MED ORDER — DOCUSATE SODIUM 100 MG PO CAPS
100.0000 mg | ORAL_CAPSULE | Freq: Two times a day (BID) | ORAL | Status: DC
  Administered 2018-05-15 – 2018-05-18 (×6): 100 mg via ORAL
  Filled 2018-05-15 (×6): qty 1

## 2018-05-15 MED ORDER — BUPIVACAINE-EPINEPHRINE (PF) 0.5% -1:200000 IJ SOLN
INTRAMUSCULAR | Status: DC | PRN
  Administered 2018-05-15: 30 mL

## 2018-05-15 MED ORDER — POLYVINYL ALCOHOL 1.4 % OP SOLN
1.0000 [drp] | Freq: Three times a day (TID) | OPHTHALMIC | Status: DC | PRN
  Filled 2018-05-15: qty 15

## 2018-05-15 MED ORDER — CEFAZOLIN SODIUM-DEXTROSE 2-4 GM/100ML-% IV SOLN
2.0000 g | Freq: Once | INTRAVENOUS | Status: DC
  Filled 2018-05-15: qty 100

## 2018-05-15 MED ORDER — SODIUM CHLORIDE 0.9 % IV SOLN
INTRAVENOUS | Status: DC | PRN
  Administered 2018-05-15: 50 ug/min via INTRAVENOUS

## 2018-05-15 MED ORDER — FENTANYL CITRATE (PF) 100 MCG/2ML IJ SOLN
INTRAMUSCULAR | Status: DC | PRN
  Administered 2018-05-15: 50 ug via INTRAVENOUS

## 2018-05-15 MED ORDER — TRAMADOL HCL 50 MG PO TABS
50.0000 mg | ORAL_TABLET | Freq: Four times a day (QID) | ORAL | Status: DC | PRN
  Administered 2018-05-15: 50 mg via ORAL
  Filled 2018-05-15: qty 1

## 2018-05-15 MED ORDER — TRAMADOL HCL 50 MG PO TABS: 50.0000 mg | ORAL_TABLET | Freq: Two times a day (BID) | ORAL | Status: DC | PRN

## 2018-05-15 MED ORDER — POLYETHYLENE GLYCOL 3350 17 G PO PACK: 17.0000 g | PACK | Freq: Every day | ORAL | Status: DC | PRN

## 2018-05-15 MED ORDER — OXYCODONE HCL 5 MG PO TABS: 5.0000 mg | ORAL_TABLET | Freq: Once | ORAL | Status: DC | PRN

## 2018-05-15 MED ORDER — ACETAMINOPHEN 325 MG PO TABS: 325.0000 mg | ORAL_TABLET | Freq: Four times a day (QID) | ORAL | Status: DC | PRN

## 2018-05-15 MED ORDER — TRAMADOL HCL 50 MG PO TABS
50.0000 mg | ORAL_TABLET | Freq: Four times a day (QID) | ORAL | Status: DC | PRN
  Administered 2018-05-16 – 2018-05-18 (×5): 50 mg via ORAL
  Filled 2018-05-15 (×6): qty 1

## 2018-05-15 MED ORDER — LIDOCAINE HCL (CARDIAC) PF 100 MG/5ML IV SOSY
PREFILLED_SYRINGE | INTRAVENOUS | Status: DC | PRN
  Administered 2018-05-15: 50 mg via INTRAVENOUS

## 2018-05-15 MED ORDER — MELATONIN 10 MG PO TABS: 10.0000 mg | ORAL_TABLET | Freq: Every day | ORAL | Status: DC

## 2018-05-15 MED ORDER — PANTOPRAZOLE SODIUM 40 MG PO TBEC
40.0000 mg | DELAYED_RELEASE_TABLET | Freq: Every day | ORAL | Status: DC
  Administered 2018-05-15 – 2018-05-18 (×4): 40 mg via ORAL
  Filled 2018-05-15 (×4): qty 1

## 2018-05-15 MED ORDER — CALCIUM CARBONATE ANTACID 500 MG PO CHEW
1500.0000 mg | CHEWABLE_TABLET | Freq: Two times a day (BID) | ORAL | Status: DC
  Administered 2018-05-16 – 2018-05-18 (×4): 1500 mg via ORAL
  Filled 2018-05-15 (×4): qty 8

## 2018-05-15 SURGICAL SUPPLY — 49 items
Activation tool ×2 IMPLANT
BIT DRILL 4.0X280 (BIT) ×3 IMPLANT
BNDG COHESIVE 4X5 TAN STRL (GAUZE/BANDAGES/DRESSINGS) ×3 IMPLANT
BNDG COHESIVE 6X5 TAN STRL LF (GAUZE/BANDAGES/DRESSINGS) ×3 IMPLANT
Ball Nose Guide Wire ×3 IMPLANT
CANISTER SUCT 1200ML W/VALVE (MISCELLANEOUS) ×3 IMPLANT
CHLORAPREP W/TINT 26ML (MISCELLANEOUS) ×6 IMPLANT
COVER WAND RF STERILE (DRAPES) IMPLANT
Calibrated Dill, AO Style ×2 IMPLANT
DRAPE C-ARMOR (DRAPES) ×3 IMPLANT
DRAPE SHEET LG 3/4 BI-LAMINATE (DRAPES) ×3 IMPLANT
DRAPE STERI IOBAN 125X83 (DRAPES) ×3 IMPLANT
DRSG OPSITE POSTOP 3X4 (GAUZE/BANDAGES/DRESSINGS) ×6 IMPLANT
DRSG OPSITE POSTOP 4X6 (GAUZE/BANDAGES/DRESSINGS) ×6 IMPLANT
ELECT CAUTERY BLADE 6.4 (BLADE) ×3 IMPLANT
ELECT REM PT RETURN 9FT ADLT (ELECTROSURGICAL) ×3
ELECTRODE REM PT RTRN 9FT ADLT (ELECTROSURGICAL) ×1 IMPLANT
GAUZE SPONGE 4X4 12PLY STRL (GAUZE/BANDAGES/DRESSINGS) ×3 IMPLANT
GLOVE BIO SURGEON STRL SZ8 (GLOVE) ×6 IMPLANT
GLOVE BIOGEL PI IND STRL 7.5 (GLOVE) ×3 IMPLANT
GLOVE BIOGEL PI INDICATOR 7.5 (GLOVE) ×6
GLOVE INDICATOR 8.0 STRL GRN (GLOVE) ×3 IMPLANT
GOWN STRL REUS W/ TWL LRG LVL3 (GOWN DISPOSABLE) ×1 IMPLANT
GOWN STRL REUS W/ TWL XL LVL3 (GOWN DISPOSABLE) ×1 IMPLANT
GOWN STRL REUS W/TWL LRG LVL3 (GOWN DISPOSABLE) ×2
GOWN STRL REUS W/TWL XL LVL3 (GOWN DISPOSABLE) ×2
GUIDE PIN 3.2X330 (PIN) ×3
GUIDEWIRE BALL NOSE 3.0X900 (WIRE) ×3 IMPLANT
Guide Pin ×2 IMPLANT
MAT ABSORB  FLUID 56X50 GRAY (MISCELLANEOUS) ×2
MAT ABSORB FLUID 56X50 GRAY (MISCELLANEOUS) ×1 IMPLANT
NAIL 11X36X130D ANGLED ES (Nail) ×3 IMPLANT
NEEDLE FILTER BLUNT 18X 1/2SAF (NEEDLE) ×2
NEEDLE FILTER BLUNT 18X1 1/2 (NEEDLE) ×1 IMPLANT
NEEDLE HYPO 22GX1.5 SAFETY (NEEDLE) ×3 IMPLANT
NS IRRIG 500ML POUR BTL (IV SOLUTION) ×3 IMPLANT
PACK HIP COMPR (MISCELLANEOUS) ×3 IMPLANT
PIN GUIDE 3.2X330 (PIN) ×1 IMPLANT
SCREW LOCK CORT 5X34 (Screw) ×3 IMPLANT
SCREW LOCK LAG 10.5X95 GALILEO (Screw) ×3 IMPLANT
STAPLER SKIN PROX 35W (STAPLE) ×3 IMPLANT
STRAP SAFETY 5IN WIDE (MISCELLANEOUS) ×3 IMPLANT
SUT VIC AB 0 CT1 36 (SUTURE) ×3 IMPLANT
SUT VIC AB 1 CT1 36 (SUTURE) ×3 IMPLANT
SUT VIC AB 2-0 CT1 (SUTURE) ×6 IMPLANT
SYR 10ML LL (SYRINGE) ×3 IMPLANT
SYR 30ML LL (SYRINGE) ×3 IMPLANT
TAPE MICROFOAM 4IN (TAPE) IMPLANT
TOOL ACTIVATION (INSTRUMENTS) ×3 IMPLANT

## 2018-05-15 NOTE — Progress Notes (Signed)
Bladder scan done    No urine

## 2018-05-15 NOTE — ED Notes (Signed)
ED TO INPATIENT HANDOFF REPORT  Name/Age/Gender Christine [name] 85 y.o. female  Code Status Code Status History    Date Active Date Inactive Code Status Order ID Comments User Context   10/24/2016 2019 10/27/2016 1512 Full Code [id number]  Corky Mull, MD Inpatient   01/09/2015 1912 01/13/2015 1822 Full Code [id number]  Corky Mull, MD Inpatient   01/09/2015 1213 01/09/2015 1912 Full Code [id number]  Gladstone Lighter, MD Inpatient    Advance Directive Documentation     Most Recent Value  Type of Advance Directive  Healthcare Power of Attorney, Living will  Pre-existing out of facility DNR order (yellow form or pink MOST form)  -  "MOST" Form in Place?  -      Home/SNF/Other Home  Chief Complaint fall  Level of Care/Admitting Diagnosis ED Disposition    ED Disposition Condition [place]: [place] [100120]  Level of Care: Med-Surg [16]  Diagnosis: Hip fracture [facility]) [433295]  Admitting Physician: Odessa Fleming  Attending Physician: Odessa Fleming  Estimated length of stay: past midnight tomorrow  Certification:: I certify this patient will need inpatient services for at least 2 midnights  PT Class (Do Not Modify): Inpatient [101]  PT Acc Code (Do Not Modify): Private [1]       Medical History Past Medical History:  Diagnosis Date  . Cancer ([place])    colon  . Carpal tunnel syndrome   . Complication of anesthesia    hard time waking up after thyroidectomy  . DJD (degenerative joint disease)   . Family history of adverse reaction to anesthesia    daughter PONV  . Hearing loss   . Hypothyroidism   . Osteoporosis   . Wears glasses     Allergies Allergies  Allergen Reactions  . Adhesive [Tape] Other (See Comments)    Red itchy  . Tapentadol Other (See Comments)    Red itchy    IV Location/Drains/Wounds Patient Lines/Drains/Airways Status   Active Line/Drains/Airways    Name:   Placement date:    Placement time:   Site:   Days:   Peripheral IV 10/24/16 Left Arm   10/24/16    1210    Arm   568   Peripheral IV 05/14/18 Right Hand   05/14/18    2207    Hand   1   Peripheral IV 05/14/18 Right Antecubital   05/14/18    2320    Antecubital   1   Incision (Closed) 01/09/15 Hip Right   01/09/15    1712     1222   Incision (Closed) 10/24/16 Hip Right   10/24/16    1631     568   Incision (Closed) 10/24/16 Thigh Right;Lateral   10/24/16    2000     568   Pressure Injury 10/24/16 Stage I -  Intact skin with non-blanchable redness of a localized area usually over a bony prominence.   10/24/16    2020     568   Wound / Incision (Open or Dehisced) 01/09/15 Other (Comment) Knee Left red   01/09/15    1220    Knee   1222   Wound / Incision (Open or Dehisced) 01/09/15 Other (Comment) Knee Left below knee  with scab   01/09/15    1220    Knee   1222   Wound / Incision (Open or Dehisced) 01/09/15 Other (Comment) Knee Right right knee red  01/09/15    1220    Knee   1222   Wound / Incision (Open or Dehisced) 01/09/15 Other (Comment) Knee Right red   01/09/15    1220    Knee   1222   Wound / Incision (Open or Dehisced) 01/09/15 Other (Comment) Elbow Left red   01/09/15    1220    Elbow   1222   Wound / Incision (Open or Dehisced) 01/09/15 Other (Comment) Elbow Left gray scab   01/09/15    1220    Elbow   1222          Labs/Imaging Results for orders placed or performed during the hospital encounter of 05/14/18 (from the past 48 hour(s))  CBC with Differential     Status: Abnormal   Collection Time: 05/14/18 10:08 PM  Result Value Ref Range   WBC 10.5 4.0 - 10.5 K/uL   RBC 3.67 (L) 3.87 - 5.11 MIL/uL   Hemoglobin 12.0 12.0 - 15.0 g/dL   HCT 35.8 (L) 36.0 - 46.0 %   MCV 97.5 80.0 - 100.0 fL   MCH 32.7 26.0 - 34.0 pg   MCHC 33.5 30.0 - 36.0 g/dL   RDW 13.6 11.5 - 15.5 %   Platelets 247 150 - 400 K/uL   nRBC 0.0 0.0 - 0.2 %   Neutrophils Relative % 58 %   Neutro Abs 6.2 1.7 - 7.7 K/uL    Lymphocytes Relative 30 %   Lymphs Abs 3.2 0.7 - 4.0 K/uL   Monocytes Relative 8 %   Monocytes Absolute 0.8 0.1 - 1.0 K/uL   Eosinophils Relative 2 %   Eosinophils Absolute 0.2 0.0 - 0.5 K/uL   Basophils Relative 1 %   Basophils Absolute 0.1 0.0 - 0.1 K/uL   Immature Granulocytes 1 %   Abs Immature Granulocytes 0.05 0.00 - 0.07 K/uL    Comment: Performed at [facility], [address] [place], [place] [address]  Protime-INR     Status: None   Collection Time: 05/14/18 10:08 PM  Result Value Ref Range   Prothrombin Time 14.0 11.4 - 15.2 seconds   INR 1.1 0.8 - 1.2    Comment: (NOTE) INR goal varies based on device and disease states. Performed at [facility], [address] [place], [place] [address]   Type and screen [place]     Status: None (Preliminary result)   Collection Time: 05/14/18 10:08 PM  Result Value Ref Range   ABO/RH(D) PENDING    Antibody Screen PENDING    Sample Expiration      05/17/2018 Performed at [facility], [address] [place], [place] [address]   Basic metabolic panel     Status: Abnormal   Collection Time: 05/14/18 11:31 PM  Result Value Ref Range   Sodium 135 135 - 145 mmol/L   Potassium 3.7 3.5 - 5.1 mmol/L   Chloride 101 98 - 111 mmol/L   CO2 26 22 - 32 mmol/L   Glucose, Bld 144 (H) 70 - 99 mg/dL   BUN 16 8 - 23 mg/dL   Creatinine, Ser 0.92 0.44 - 1.00 mg/dL   Calcium 8.4 (L) 8.9 - 10.3 mg/dL   GFR calc non Af Amer 53 (L) >60 mL/min   GFR calc Af Amer >60 >60 mL/min   Anion gap 8 5 - 15    Comment: Performed at [facility] [address] [place], [place] [address]  Type and screen  [place]     Status: None   Collection Time: 05/14/18 11:31 PM  Result Value Ref Range   ABO/RH(D) A POS    Antibody Screen NEG    Sample Expiration      05/17/2018 Performed at [facility], [address] [place], [place] [address]    Dg  Tibia/fibula Right  Result Date: 05/14/2018 CLINICAL DATA:  Fall EXAM: RIGHT TIBIA AND FIBULA - 2 VIEW COMPARISON:  None. FINDINGS: There is no evidence of fracture or other focal bone lesions. Soft tissues are unremarkable. IMPRESSION: Negative. Electronically Signed   By: Rolm Baptise M.D.   On: 05/14/2018 23:11   Dg Chest Portable 1 View  Result Date: 05/14/2018 CLINICAL DATA:  Fall EXAM: PORTABLE CHEST 1 VIEW COMPARISON:  10/21/2016 FINDINGS: Cardiomegaly. No confluent airspace opacities, effusions or edema. No acute bony abnormality. IMPRESSION: Cardiomegaly.  No active disease. Electronically Signed   By: Rolm Baptise M.D.   On: 05/14/2018 23:12   Dg Hip Unilat W Or Wo Pelvis 2-3 Views Left  Result Date: 05/14/2018 CLINICAL DATA:  Fall.  Hip pain. EXAM: DG HIP (WITH OR WITHOUT PELVIS) 2-3V LEFT COMPARISON:  10/24/2016 FINDINGS: Prior right hip replacement. There is a left intertrochanteric femoral fracture with varus angulation and mild displacement of fracture fragments. No subluxation or dislocation. IMPRESSION: Left femoral intertrochanteric fracture with varus angulation and mild displacement. Electronically Signed   By: Rolm Baptise M.D.   On: 05/14/2018 23:11    Pending Labs Unresulted Labs (From admission, onward)   None      Vitals/Pain Today's Vitals   05/14/18 2206 05/14/18 2207 05/14/18 2213 05/15/18 0000  BP:  (!) 190/76 (!) 187/91 (!) 142/55  Pulse:  92 88 92  Resp:  (!) 24 (!) 22 19  Temp:  97.6 F (36.4 C)    TempSrc:  Oral    SpO2:  98% 96% 97%  Weight:      Height:      PainSc: 10-Worst pain ever       Isolation Precautions No active isolations  Medications Medications  traMADol (ULTRAM) tablet 50 mg (has no administration in time range)  bacitracin ointment ( Topical Given 05/14/18 2334)  ondansetron (ZOFRAN) injection 4 mg (4 mg Intravenous Given 05/14/18 2334)  morphine 4 MG/ML injection 4 mg (4 mg Intravenous Given 05/14/18 2334)     Mobility Normally walks with device

## 2018-05-15 NOTE — H&P (Signed)
[place] [facility] [place] NAME: Christine [name]    MR#:  [id number]  DATE OF BIRTH:  Nov 01, 1920  DATE OF ADMISSION:  05/14/2018  PRIMARY CARE PHYSICIAN: Glendon Axe, MD   REQUESTING/REFERRING PHYSICIAN: Dr. Ernst Spell  CHIEF COMPLAINT:  patient brought to the emergency room after she had a mechanical fall at home and has a hip fracture on the left. She is very hard on hearing. History obtained from granddaughter and daughter in the ER  HISTORY OF PRESENT ILLNESS:  Christine [name]  is a 85 y.o. female with a known history of impaired hearing, colon cancer, left leg DVT diagnosed in August 2019 on Xarelto took last dose of 20 mg this morning, hypothyroidism comes to the emergency room after she had a mechanical fall at home. Patient lives with granddaughter. She was trying to wear her robe and lost balance and landed on the back and started having left hip pain. She was found to have left hip fracture.  Physician has spoken with orthopedic Dr.Poggi who will see patient in the morning. Patient took her Xarelto 20 mg this morning.  Patient is being admitted for further evaluation and management she does not have cardiac history. Denies any chest pain or shortness of breath.  PAST MEDICAL HISTORY:   Past Medical History:  Diagnosis Date  . Cancer ([place])    colon  . Carpal tunnel syndrome   . Complication of anesthesia    hard time waking up after thyroidectomy  . DJD (degenerative joint disease)   . Family history of adverse reaction to anesthesia    daughter PONV  . Hearing loss   . Hypothyroidism   . Osteoporosis   . Wears glasses     PAST SURGICAL HISTOIRY:   Past Surgical History:  Procedure Laterality Date  . ABDOMINAL HYSTERECTOMY    . COLON RESECTION  1995   for colon cancer  . EYE SURGERY Bilateral    cataract surgery  . INTRAMEDULLARY (IM) NAIL INTERTROCHANTERIC Right 01/09/2015   Procedure: INTRAMEDULLARY (IM) NAIL  INTERTROCHANTRIC;  Surgeon: Corky Mull, MD;  Location: [facility] ORS;  Service: Orthopedics;  Laterality: Right;  . Lung surgery     as a child for empyema  . OOPHORECTOMY    . THYROID SURGERY  1967   for multinodular goitre  . TOTAL HIP REVISION Right 10/24/2016   Procedure: TOTAL HIP REVISION AND HARDWARE REMOVAL;  Surgeon: Corky Mull, MD;  Location: [facility] ORS;  Service: Orthopedics;  Laterality: Right;    SOCIAL HISTORY:   Social History   Tobacco Use  . Smoking status: Never Smoker  . Smokeless tobacco: Never Used  Substance Use Topics  . Alcohol use: No    FAMILY HISTORY:   Family History  Problem Relation Age of Onset  . Heart disease Father   . Diabetes Sister   . Cancer Sister   . Diabetes Brother   . Cancer Brother   . Cancer Mother     DRUG ALLERGIES:   Allergies  Allergen Reactions  . Adhesive [Tape] Other (See Comments)    Red itchy  . Tapentadol Other (See Comments)    Red itchy    REVIEW OF SYSTEMS:  Review of Systems  Unable to perform ROS: Other  HENT: Positive for hearing loss.      MEDICATIONS AT HOME:   Prior to Admission medications   Medication Sig Start Date End Date Taking? Authorizing Provider  calcium carbonate (OSCAL) 1500 (  600 Ca) MG TABS tablet Take 600 mg by mouth 2 (two) times daily with a meal.   Yes [provider]  Cholecalciferol (KP VITAMIN D3) 2000 units CAPS Take 2,000 Units by mouth daily.    Yes [provider]  furosemide (LASIX) 20 MG tablet Take 20 mg by mouth as needed.   Yes [provider]  levothyroxine (SYNTHROID, LEVOTHROID) 100 MCG tablet Take 100 mcg by mouth daily before breakfast.    Yes [provider]  LUBRICANT EYE DROPS 0.4-0.3 % SOLN Place 1 drop into both eyes 3 (three) times daily as needed. For dry eyes. 07/19/16  Yes [provider]  Melatonin 10 MG TABS Take 10 mg by mouth at bedtime.   Yes [provider]  potassium chloride (K-DUR) 10 MEQ tablet  Take 10 mEq by mouth as needed.   Yes [provider]  raloxifene (EVISTA) 60 MG tablet Take 60 mg by mouth daily.  09/21/16  Yes [provider]  cholecalciferol (VITAMIN D) 1000 units tablet Take 1,000 Units by mouth every other day.    [provider]  hydrochlorothiazide (HYDRODIURIL) 12.5 MG tablet Take 12.5 mg by mouth daily as needed. For fluid retention. 07/18/16   [provider]      VITAL SIGNS:  Blood pressure (!) 142/55, pulse 92, temperature 97.6 F (36.4 C), temperature source Oral, resp. rate 19, height 5\' 7"  (1.702 m), weight 59 kg, SpO2 97 %.  PHYSICAL EXAMINATION:  GENERAL:  85 y.o.-year-old patient lying in the bed with no acute distress.  EYES: Pupils equal, round, reactive to light and accommodation. No scleral icterus. Extraocular muscles intact.  HEENT: Head atraumatic, normocephalic. Oropharynx and nasopharynx clear.  NECK:  Supple, no jugular venous distention. No thyroid enlargement, no tenderness.  LUNGS: Normal breath sounds bilaterally, no wheezing, rales,rhonchi or crepitation. No use of accessory muscles of respiration.  CARDIOVASCULAR: S1, S2 normal. No murmurs, rubs, or gallops.  ABDOMEN: Soft, nontender, nondistended. Bowel sounds present. No organomegaly or mass.  EXTREMITIES: No pedal edema, cyanosis, or clubbing. Decreased range of motion left leg NEUROLOGIC: Cranial nerves II through XII are intact. Muscle strength 5/5 in all extremities. Sensation intact. Gait not checked.  PSYCHIATRIC: The patient is alert and awake  SKIN: No obvious rash, lesion, or ulcer.   LABORATORY PANEL:   CBC Recent Labs  Lab 05/14/18 2208  WBC 10.5  HGB 12.0  HCT 35.8*  PLT 247   ------------------------------------------------------------------------------------------------------------------  Chemistries  Recent Labs  Lab 05/14/18 2331  NA 135  K 3.7  CL 101  CO2 26  GLUCOSE 144*  BUN 16  CREATININE 0.92  CALCIUM 8.4*    ------------------------------------------------------------------------------------------------------------------  Cardiac Enzymes No results for input(s): TROPONINI in the last 168 hours. ------------------------------------------------------------------------------------------------------------------  RADIOLOGY:  Dg Tibia/fibula Right  Result Date: 05/14/2018 CLINICAL DATA:  Fall EXAM: RIGHT TIBIA AND FIBULA - 2 VIEW COMPARISON:  None. FINDINGS: There is no evidence of fracture or other focal bone lesions. Soft tissues are unremarkable. IMPRESSION: Negative. Electronically Signed   By: Rolm Baptise M.D.   On: 05/14/2018 23:11   Dg Chest Portable 1 View  Result Date: 05/14/2018 CLINICAL DATA:  Fall EXAM: PORTABLE CHEST 1 VIEW COMPARISON:  10/21/2016 FINDINGS: Cardiomegaly. No confluent airspace opacities, effusions or edema. No acute bony abnormality. IMPRESSION: Cardiomegaly.  No active disease. Electronically Signed   By: Rolm Baptise M.D.   On: 05/14/2018 23:12   Dg Hip Unilat W Or Wo Pelvis 2-3 Views Left  Result Date: 05/14/2018 CLINICAL DATA:  Fall.  Hip pain. EXAM: DG HIP (WITH OR WITHOUT PELVIS) 2-3V LEFT COMPARISON:  10/24/2016 FINDINGS: Prior right hip replacement. There is a left intertrochanteric femoral fracture with varus angulation and mild displacement of fracture fragments. No subluxation or dislocation. IMPRESSION: Left femoral intertrochanteric fracture with varus angulation and mild displacement. Electronically Signed   By: Rolm Baptise M.D.   On: 05/14/2018 23:11    EKG:  sinus rhythm, left atrial enlargement, right axis deviation  IMPRESSION AND PLAN:   * Christine [name]  is a 85 y.o. female with a known history of impaired hearing, colon cancer, left leg DVT diagnosed in August 2019 on Xarelto took last dose of 20 mg this morning, hypothyroidism comes to the emergency room after she had a mechanical fall at home. Patient lives with granddaughter. S  1. Preop  evaluation for left femoral intertrochanteric fracture -admit to orthopedic floor -IV fluids -orthopedic consultation with Dr. Marton Redwood G.I.-- aware of consult -hold Xarelto-- patient took last dose 05/14/2018 in the morning -PRN pain meds -patient does not have cardiac history per granddaughter. She is hemodynamically stable. -She is at a low to intermediate risk for surgery. Family understands the risk and benefits for surgery  2. History of left posterior tibial vein DVT in August 2019 -patient has been on Xarelto -ultrasound left lower extremity--- if no clot burden seen consider discontinuing anticoagulation  3. Hypothyroidism continue Synthroid  4. DVT prophylaxis SCD-- no antiplatelet since surgery pending  Was discussed with granddaughter and daughter.   All the records are reviewed and case discussed with ED provider.   CODE STATUS: full code  TOTAL TIME TAKING CARE OF THIS PATIENT: *50** minutes.    Fritzi Mandes M.D on 05/15/2018 at 12:19 AM  Between 7am to 6pm - Pager - [phone]  After 6pm go to [email or web address] - password [email or web address] [facility]  [facility]  Office  [phone]  CC: Primary care physician; Glendon Axe, MD

## 2018-05-15 NOTE — Progress Notes (Signed)
Family Meeting Note  Advance Directive:yes Today a meeting took place with the daughter and granddaughter  Patient is being admitted for left femoral enter trochanteric fracture status post mechanical fall at home. She is on Xarelto for left lower extremity DVT August 2019. Patient otherwise is hemodynamically stable she is fairly independent lives at home with granddaughter. No cardiac history. Code status address. Patient is a full code according to the daughter and granddaughter. Time spent during discussion 16 minutes Fritzi Mandes, MD

## 2018-05-15 NOTE — Anesthesia Procedure Notes (Signed)
Procedure Name: Intubation Date/Time: 05/15/2018 1:16 PM Performed by: Willette Alma, CRNA Pre-anesthesia Checklist: Patient identified, Patient being monitored, Timeout performed, Emergency Drugs available and Suction available Patient Re-evaluated:Patient Re-evaluated prior to induction Oxygen Delivery Method: Circle system utilized Preoxygenation: Pre-oxygenation with 100% oxygen Induction Type: IV induction Ventilation: Mask ventilation without difficulty Laryngoscope Size: Mac, 3 and McGraph Grade View: Grade I Tube type: Oral Tube size: 7.0 mm Number of attempts: 1 Airway Equipment and Method: Stylet Placement Confirmation: ETT inserted through vocal cords under direct vision,  positive ETCO2 and breath sounds checked- equal and bilateral Secured at: 21 cm Tube secured with: Tape Dental Injury: Teeth and Oropharynx as per pre-operative assessment  Comments: Utilized video laryngoscope d/t pt positioning in traction bed. Atraumatic DL x 1 attempt. Lips and teeth as before.

## 2018-05-15 NOTE — Anesthesia Post-op Follow-up Note (Signed)
Anesthesia QCDR form completed.        

## 2018-05-15 NOTE — Transfer of Care (Signed)
Immediate Anesthesia Transfer of Care Note  Patient: Christine [name]  Procedure(s) Performed: INTRAMEDULLARY (IM) NAIL INTERTROCHANTRIC - LEFT FEMUR (Left Hip)  Patient Location: PACU  Anesthesia Type:General  Level of Consciousness: awake, alert  and oriented  Airway & Oxygen Therapy: Patient Spontanous Breathing and Patient connected to nasal cannula oxygen  Post-op Assessment: Report given to RN and Post -op Vital signs reviewed and stable  Post vital signs: Reviewed and stable  Last Vitals:  Vitals Value Taken Time  BP 144/50 05/15/2018  2:53 PM  Temp    Pulse 85 05/15/2018  2:57 PM  Resp 13 05/15/2018  2:57 PM  SpO2 100 % 05/15/2018  2:57 PM  Vitals shown include unvalidated device data.  Last Pain:  Vitals:   05/15/18 0939  TempSrc:   PainSc: 0-No pain         Complications: No apparent anesthesia complications

## 2018-05-15 NOTE — Op Note (Signed)
05/15/2018  2:57 PM  Patient:   Christine [name]  Pre-Op Diagnosis:   Closed displaced intertrochanteric fracture, left hip.  Post-Op Diagnosis:   Same  Procedure:   Reduction and internal fixation of displaced intertrochanteric left hip fracture with AOS TFN nail.  Surgeon:   Pascal Lux, MD  Assistant:   None  Anesthesia:   GET  Findings:   As above  Complications:   None  EBL:   100 cc  Fluids:   800 cc crystalloid  UOP:   None  TT:   None  Drains:   None  Closure:   Staples  Implants:   AOS 11 x 360 mm TFN with a 95 mm lag screw and a 34 mm distal interlocking screw  Brief Clinical Note:   The patient is a 85 year old female who sustained the above-noted injury last evening when she tripped and fell in her home. She was brought to the emergency room where x-rays demonstrated the above-noted injury. She has been cleared medically and presents at this time for reduction and internal fixation of the displaced three-part intertrochanteric left hip fracture.  Procedure:   The patient was brought into the operating room and lain in the supine position. After adequate general endotracheal intubation and anesthesia were obtained, the patient was repositioned on the fracture table. The uninjured leg was placed in a flexed and abducted position while the injured lower extremity was placed in longitudinal traction. The fracture was reduced using longitudinal traction and internal rotation. The adequacy of reduction was verified fluoroscopically in AP and lateral projections and found to be near anatomic. The lateral aspects of the left hip and thigh were prepped with ChloraPrep solution before being draped sterilely. Preoperative antibiotics were administered. A timeout was performed to verify the appropriate surgical site. The greater trochanter was identified fluoroscopically and an approximately 3 cm incision made about 2-3 fingerbreadths above the tip of the greater trochanter.  The incision was carried down through the subcutaneous tissues to expose the gluteal fascia. This was split the length of the incision, providing access to the tip of the trochanter. Under fluoroscopic guidance, a guidewire was drilled through the tip of the trochanter into the proximal metaphysis to the level of the lesser trochanter. After verifying its position fluoroscopically in AP and lateral projections, it was overreamed with the initial reamer to the depth of the lesser trochanter. A guidewire was passed down through the femoral canal to the supracondylar region. The adequacy of guidewire position was verified fluoroscopically in AP and lateral projections before the length of the guidewire within the canal was measured and found to be 365 mm. Therefore, a 360 mm length nail was selected. The guidewire was overreamed sequentially using the flexible reamers, beginning with a 10 mm reamer and progressing to a 12.5 mm reamer. This provided good cortical chatter. The 11 x 360 mm AOS TFN rod was selected and advanced to the appropriate depth, as verified fluoroscopically.   The guide system for the lag screw was positioned and advanced through an approximately 2 cm stab incision over the lateral aspect of the proximal femur. The guidewire was drilled up through the trochanteric femoral nail and into the femoral neck to rest within 5 mm of subchondral bone. After verifying its position in the femoral neck and head in both AP and lateral projections, the guidewire was measured and found to be optimally replicated by a 95 mm lag screw. The guidewire was overreamed to the appropriate depth  before the lag screw was inserted and advanced to the appropriate depth as verified fluoroscopically in AP and lateral projections. Compression was applied across the screw using the appropriate device before the lag screw was locked into position. The small peg was then removed, permitting later secondary compression through  the lag screw as the fracture settles. Again the adequacy of hardware position and fracture reduction was verified fluoroscopically in AP and lateral projections and found to be excellent.  Attention was directed distally. Using the same guide arm, the appropriate guide system was selected to insert a midshaft interlocking screw below the fracture. An approximately 1.5 cm stab incision was made over the skin at the appropriate point before the drill bit was advanced through the cortex and across the hole of the nail. The appropriate length of the screw was determined before the 34 mm distal interlocking screw was positioned, then advanced and tightened securely. Again the adequacy of screw position was verified fluoroscopically in AP and lateral projections and found to be excellent.  The wounds were irrigated thoroughly with sterile saline solution before the abductor fascia was reapproximated using #0 Vicryl interrupted sutures. The subcutaneous tissues were closed using 2-0 Vicryl interrupted sutures. The skin was closed using staples. A total of 30 cc of 0.5% Sensorcaine with epinephrine was injected in and around all incisions. Sterile occlusive dressings were applied to all wounds before the patient was transferred back to her hospital bed. The patient was then awakened, extubated, and returned to the recovery room in satisfactory condition after tolerating the procedure well.

## 2018-05-15 NOTE — Consult Note (Signed)
ORTHOPAEDIC CONSULTATION  REQUESTING PHYSICIAN: Otila Back, MD  Chief Complaint:   Left hip fracture.  History of Present Illness: Christine [name] is a 85 y.o. female with a history of hypothyroidism and severe osteoporosis who lives independently with family members and is able to ambulate independently with a walker.  The patient was in her usual state of health yesterday evening when she apparently went to sit down at home, but lost her balance and fell backwards onto her left hip.  She was unable to get up.  The EMS was called and she was brought to the emergency room where x-rays demonstrated an intertrochanteric fracture of her left hip.  The patient also noted a laceration to her right lower leg which was managed by the ER staff.  There was no other associated injury resulting from the fall.  The patient did not strike her head or lose consciousness.  She also denies any lightheadedness, dizziness, chest pain, shortness of breath, or other symptoms which may have precipitated her fall.  Past Medical History:  Diagnosis Date  . Cancer ([place])    colon  . Carpal tunnel syndrome   . Complication of anesthesia    hard time waking up after thyroidectomy  . DJD (degenerative joint disease)   . Family history of adverse reaction to anesthesia    daughter PONV  . Hearing loss   . Hypothyroidism   . Osteoporosis   . Wears glasses    Past Surgical History:  Procedure Laterality Date  . ABDOMINAL HYSTERECTOMY    . COLON RESECTION  1995   for colon cancer  . EYE SURGERY Bilateral    cataract surgery  . INTRAMEDULLARY (IM) NAIL INTERTROCHANTERIC Right 01/09/2015   Procedure: INTRAMEDULLARY (IM) NAIL INTERTROCHANTRIC;  Surgeon: Corky Mull, MD;  Location: [facility] ORS;  Service: Orthopedics;  Laterality: Right;  . Lung surgery     as a child for empyema  . OOPHORECTOMY    . THYROID SURGERY  1967   for multinodular goitre  .  TOTAL HIP REVISION Right 10/24/2016   Procedure: TOTAL HIP REVISION AND HARDWARE REMOVAL;  Surgeon: Corky Mull, MD;  Location: [facility] ORS;  Service: Orthopedics;  Laterality: Right;   Social History   Socioeconomic History  . Marital status: Single    Spouse name: Not on file  . Number of children: Not on file  . Years of education: Not on file  . Highest education level: Not on file  Occupational History  . Not on file  Social Needs  . Financial resource strain: Not on file  . Food insecurity:    Worry: Not on file    Inability: Not on file  . Transportation needs:    Medical: Not on file    Non-medical: Not on file  Tobacco Use  . Smoking status: Never Smoker  . Smokeless tobacco: Never Used  Substance and Sexual Activity  . Alcohol use: No  . Drug use: No  . Sexual activity: Not Currently  Lifestyle  . Physical activity:    Days per week: Not on file    Minutes per session: Not on file  . Stress: Not on file  Relationships  . Social connections:    Talks on phone: Not on file    Gets together: Not on file    Attends religious service: Not on file    Active member of club or organization: Not on file    Attends meetings of clubs or organizations: Not on  file    Relationship status: Not on file  Other Topics Concern  . Not on file  Social History Narrative   Lives at home by herself   Family History  Problem Relation Age of Onset  . Heart disease Father   . Diabetes Sister   . Cancer Sister   . Diabetes Brother   . Cancer Brother   . Cancer Mother    Allergies  Allergen Reactions  . Adhesive [Tape] Other (See Comments)    Red itchy  . Tapentadol Other (See Comments)    Red itchy   Prior to Admission medications   Medication Sig Start Date End Date Taking? Authorizing Provider  calcium carbonate (OSCAL) 1500 (600 Ca) MG TABS tablet Take 600 mg by mouth 2 (two) times daily with a meal.   Yes [provider]  Cholecalciferol (KP VITAMIN D3) 2000  units CAPS Take 2,000 Units by mouth daily.    Yes [provider]  furosemide (LASIX) 20 MG tablet Take 20 mg by mouth as needed.   Yes [provider]  levothyroxine (SYNTHROID, LEVOTHROID) 100 MCG tablet Take 100 mcg by mouth daily before breakfast.    Yes [provider]  LUBRICANT EYE DROPS 0.4-0.3 % SOLN Place 1 drop into both eyes 3 (three) times daily as needed. For dry eyes. 07/19/16  Yes [provider]  Melatonin 10 MG TABS Take 10 mg by mouth at bedtime.   Yes [provider]  potassium chloride (K-DUR) 10 MEQ tablet Take 10 mEq by mouth as needed.   Yes [provider]  raloxifene (EVISTA) 60 MG tablet Take 60 mg by mouth daily.  09/21/16  Yes [provider]  rivaroxaban (XARELTO) 20 MG TABS tablet Take 20 mg by mouth daily.   Yes [provider]  cholecalciferol (VITAMIN D) 1000 units tablet Take 1,000 Units by mouth every other day.    [provider]  hydrochlorothiazide (HYDRODIURIL) 12.5 MG tablet Take 12.5 mg by mouth daily as needed. For fluid retention. 07/18/16   [provider]   Dg Tibia/fibula Right  Result Date: 05/14/2018 CLINICAL DATA:  Fall EXAM: RIGHT TIBIA AND FIBULA - 2 VIEW COMPARISON:  None. FINDINGS: There is no evidence of fracture or other focal bone lesions. Soft tissues are unremarkable. IMPRESSION: Negative. Electronically Signed   By: Rolm Baptise M.D.   On: 05/14/2018 23:11   Dg Chest Portable 1 View  Result Date: 05/14/2018 CLINICAL DATA:  Fall EXAM: PORTABLE CHEST 1 VIEW COMPARISON:  10/21/2016 FINDINGS: Cardiomegaly. No confluent airspace opacities, effusions or edema. No acute bony abnormality. IMPRESSION: Cardiomegaly.  No active disease. Electronically Signed   By: Rolm Baptise M.D.   On: 05/14/2018 23:12   Dg Hip Unilat W Or Wo Pelvis 2-3 Views Left  Result Date: 05/14/2018 CLINICAL DATA:  Fall.  Hip pain. EXAM: DG HIP (WITH OR WITHOUT PELVIS) 2-3V LEFT  COMPARISON:  10/24/2016 FINDINGS: Prior right hip replacement. There is a left intertrochanteric femoral fracture with varus angulation and mild displacement of fracture fragments. No subluxation or dislocation. IMPRESSION: Left femoral intertrochanteric fracture with varus angulation and mild displacement. Electronically Signed   By: Rolm Baptise M.D.   On: 05/14/2018 23:11    Positive ROS: All other systems have been reviewed and were otherwise negative with the exception of those mentioned in the HPI and as above.  Physical Exam: General:  Alert, no acute distress Psychiatric:  Patient is competent for consent with normal mood and  affect   Cardiovascular:  No pedal edema Respiratory:  No wheezing, non-labored breathing GI:  Abdomen is soft and non-tender Skin:  No lesions in the area of chief complaint Neurologic:  Sensation intact distally Lymphatic:  No axillary or cervical lymphadenopathy  Orthopedic Exam:  Orthopedic examination is limited left hip and lower extremity.  The left lower extremity clearly shortened and externally rotated as compared to the right.  Skin inspection around the left hip is unremarkable.  There is no swelling, erythema, ecchymosis, abrasions, or other skin abnormalities identified at this time.  She has moderate tenderness to palpation over the lateral aspect of the left hip.  She has more severe pain with any attempted active or passive motion of the left hip.  She is able to dorsiflex and plantarflex her toes and ankle.  Sensations intact light touch to all distributions.  She has good capillary refill to her left foot.  X-rays:  Recent x-rays of the pelvis and left hip are available for review and have been reviewed by myself.  These films demonstrate a displaced three-part intertrochanteric fracture of the left hip.  No lytic lesions or significant degenerative changes are identified.  However, significant osteopenia is noted.  Assessment: Displaced  comminuted intertrochanteric fracture left hip.  Plan: The treatment options have been discussed with the patient and her family who is at the bedside, including both surgical and nonsurgical choices.  The patient would like to proceed with surgical intervention to include intramedullary nailing of the left intertrochanteric hip fracture.  This procedure has been discussed in detail with the patient, as have the potential risks (including bleeding, infection, nerve and/or blood vessel injury, persistent or recurrent pain, malunion and/or nonunion, need for further surgery, blood clots, strokes, heart attacks and arrhythmias, etc.) and benefits.  The patient and her family state their understanding wish to proceed.  A formal written consent will be obtained by the nursing staff.  Thank you for asking me to participate in the care of this most pleasant yet unfortunate woman.  I will be happy to follow her with you.   Pascal Lux, MD  Beeper #:  [phone]  05/15/2018 7:51 AM

## 2018-05-15 NOTE — Anesthesia Preprocedure Evaluation (Signed)
Anesthesia Evaluation  Patient identified by MRN, date of birth, ID band Patient awake    Reviewed: Allergy & Precautions, H&P , NPO status , Patient's Chart, lab work & pertinent test results  History of Anesthesia Complications Negative for: history of anesthetic complications  Airway Mallampati: III  TM Distance: <3 FB Neck ROM: full    Dental  (+) Chipped, Poor Dentition, Missing   Pulmonary neg pulmonary ROS, neg shortness of breath,           Cardiovascular Exercise Tolerance: Good (-) angina(-) Past MI and (-) DOE negative cardio ROS       Neuro/Psych  Neuromuscular disease negative psych ROS   GI/Hepatic negative GI ROS, Neg liver ROS, neg GERD  ,  Endo/Other  Hypothyroidism   Renal/GU      Musculoskeletal  (+) Arthritis ,   Abdominal   Peds  Hematology negative hematology ROS (+)   Anesthesia Other Findings Past Medical History: No date: Cancer ([place])     Comment:  colon No date: Carpal tunnel syndrome No date: Complication of anesthesia     Comment:  hard time waking up after thyroidectomy No date: DJD (degenerative joint disease) No date: Family history of adverse reaction to anesthesia     Comment:  daughter PONV No date: Hearing loss No date: Hypothyroidism No date: Osteoporosis No date: Wears glasses  Past Surgical History: No date: ABDOMINAL HYSTERECTOMY 1995: COLON RESECTION     Comment:  for colon cancer No date: EYE SURGERY; Bilateral     Comment:  cataract surgery 01/09/2015: INTRAMEDULLARY (IM) NAIL INTERTROCHANTERIC; Right     Comment:  Procedure: INTRAMEDULLARY (IM) NAIL INTERTROCHANTRIC;                Surgeon: Corky Mull, MD;  Location: [facility] ORS;  Service:              Orthopedics;  Laterality: Right; No date: Lung surgery     Comment:  as a child for empyema No date: OOPHORECTOMY 1967: THYROID SURGERY     Comment:  for multinodular goitre 10/24/2016: TOTAL HIP REVISION;  Right     Comment:  Procedure: TOTAL HIP REVISION AND HARDWARE REMOVAL;                Surgeon: Corky Mull, MD;  Location: [facility] ORS;                Service: Orthopedics;  Laterality: Right;  BMI    Body Mass Index:  20.37 kg/m      Reproductive/Obstetrics negative OB ROS                             Anesthesia Physical Anesthesia Plan  ASA: III  Anesthesia Plan: General ETT   Post-op Pain Management:    Induction: Intravenous  PONV Risk Score and Plan: Ondansetron, Dexamethasone, Midazolam and Treatment may vary due to age or medical condition  Airway Management Planned: Oral ETT  Additional Equipment:   Intra-op Plan:   Post-operative Plan: Extubation in OR  Informed Consent: I have reviewed the patients History and Physical, chart, labs and discussed the procedure including the risks, benefits and alternatives for the proposed anesthesia with the patient or authorized representative who has indicated his/her understanding and acceptance.     Dental Advisory Given  Plan Discussed with: Anesthesiologist, CRNA and Surgeon  Anesthesia Plan Comments: (Patient took anti coagulant this AM so not a candidate for spinal  Patient and family member consented for risks of anesthesia including but not limited to:  - adverse reactions to medications - damage to teeth, lips or other oral mucosa - sore throat or hoarseness - Damage to heart, brain, lungs or loss of life  They voiced understanding.)        Anesthesia Quick Evaluation

## 2018-05-15 NOTE — NC FL2 (Signed)
[place] LEVEL OF CARE SCREENING TOOL     IDENTIFICATION  Patient Name: Christine [name] Birthdate: [birth year] Sex: female Admission Date (Current Location): 05/14/2018  [place] and [place] Number:  Engineering geologist and Address:  [facility], [address], [place], [place] [address]      Provider Number: [id number]  Attending Physician Name and Address:  Otila Back, MD  Relative Name and Phone Number:       Current Level of Care: Hospital Recommended Level of Care: [place] Prior Approval Number:    Date Approved/Denied:   PASRR Number: [id number])  Discharge Plan: SNF    Current Diagnoses: Patient Active Problem List   Diagnosis Date Noted  . Pressure injury of skin 10/25/2016  . 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 Hip fracture ([place]) 01/09/2015    Orientation RESPIRATION BLADDER Height & Weight     Self, Time  O2(2 Liters Oxygen. ) Continent Weight: 130 lb 1.1 oz (59 kg) Height:  5\' 7"  (170.2 cm)  BEHAVIORAL SYMPTOMS/MOOD NEUROLOGICAL BOWEL NUTRITION STATUS      Continent Diet(Diet: NPO for surgery to be advanced. )  AMBULATORY STATUS COMMUNICATION OF NEEDS Skin   Extensive Assist Verbally Surgical wounds(Incision: Left Hip. )                       Personal Care Assistance Level of Assistance  Bathing, Feeding, Dressing Bathing Assistance: Limited assistance Feeding assistance: Independent Dressing Assistance: Limited assistance     Functional Limitations Info  Sight, Hearing, Speech Sight Info: Adequate Hearing Info: Adequate Speech Info: Adequate    SPECIAL CARE FACTORS FREQUENCY  PT (By licensed PT), OT (By licensed OT)     PT Frequency: (5) OT Frequency: (5)            Contractures      Additional Factors Info  Code Status, Allergies Code Status Info: (Full Code. ) Allergies Info: (Adhesive Tape, Tapentadol)           Current Medications  (05/15/2018):  This is the current hospital active medication list Current Facility-Administered Medications  Medication Dose Route Frequency Provider Last Rate Last Dose  . 0.9 %  sodium chloride infusion   Intravenous Continuous Poggi, Marshall Cork, MD      . acetaminophen (TYLENOL) tablet 1,000 mg  1,000 mg Oral Q6H Poggi, Marshall Cork, MD      . Derrill Memo ON 05/16/2018] acetaminophen (TYLENOL) tablet 325-650 mg  325-650 mg Oral Q6H PRN Poggi, Marshall Cork, MD      . bisacodyl (DULCOLAX) suppository 10 mg  10 mg Rectal Daily PRN Poggi, Marshall Cork, MD      . calcium carbonate (TUMS - dosed in mg elemental calcium) chewable tablet 1,500 mg  1,500 mg Oral BID WC Poggi, Marshall Cork, MD      . ceFAZolin (ANCEF) IVPB 2g/100 mL premix  2 g Intravenous Q6H Poggi, Marshall Cork, MD      . cholecalciferol (VITAMIN D3) tablet 2,000 Units  2,000 Units Oral Daily Poggi, Marshall Cork, MD      . diphenhydrAMINE (BENADRYL) 12.5 MG/5ML elixir 12.5-25 mg  12.5-25 mg Oral Q4H PRN Poggi, Marshall Cork, MD      . docusate sodium (COLACE) capsule 100 mg  100 mg Oral BID Poggi, Marshall Cork, MD      . furosemide (LASIX) tablet 20 mg  20 mg Oral PRN Poggi, Marshall Cork, MD      .  HYDROmorphone (DILAUDID) injection 0.25-0.5 mg  0.25-0.5 mg Intravenous Q4H PRN Poggi, Marshall Cork, MD      . levothyroxine (SYNTHROID, LEVOTHROID) tablet 100 mcg  100 mcg Oral QAC breakfast Poggi, Marshall Cork, MD      . magnesium hydroxide (MILK OF MAGNESIA) suspension 30 mL  30 mL Oral Daily PRN Poggi, Marshall Cork, MD      . metoCLOPramide (REGLAN) tablet 5-10 mg  5-10 mg Oral Q8H PRN Poggi, Marshall Cork, MD       Or  . metoCLOPramide (REGLAN) injection 5-10 mg  5-10 mg Intravenous Q8H PRN Poggi, Marshall Cork, MD      . ondansetron (ZOFRAN) tablet 4 mg  4 mg Oral Q6H PRN Poggi, Marshall Cork, MD       Or  . ondansetron (ZOFRAN) injection 4 mg  4 mg Intravenous Q6H PRN Poggi, Marshall Cork, MD      . oxyCODONE (Oxy IR/ROXICODONE) immediate release tablet 5-10 mg  5-10 mg Oral Q4H PRN Poggi, Marshall Cork, MD      . pantoprazole (PROTONIX) EC  tablet 40 mg  40 mg Oral Daily Poggi, Marshall Cork, MD      . polyethylene glycol (MIRALAX / GLYCOLAX) packet 17 g  17 g Oral Daily PRN Poggi, Marshall Cork, MD      . polyvinyl alcohol (LIQUIFILM TEARS) 1.4 % ophthalmic solution 1 drop  1 drop Both Eyes TID PRN Poggi, Marshall Cork, MD      . raloxifene (EVISTA) tablet 60 mg  60 mg Oral Daily Poggi, Marshall Cork, MD      . Derrill Memo ON 05/16/2018] rivaroxaban (XARELTO) tablet 20 mg  20 mg Oral Daily Poggi, Marshall Cork, MD      . sodium phosphate (FLEET) 7-19 GM/118ML enema 1 enema  1 enema Rectal Once PRN Poggi, Marshall Cork, MD      . traMADol Veatrice Bourbon) tablet 50 mg  50 mg Oral Q6H PRN Poggi, Marshall Cork, MD      . traZODone (DESYREL) tablet 25 mg  25 mg Oral QHS Poggi, Marshall Cork, MD   25 mg at 05/15/18 0224     Discharge Medications: Please see discharge summary for a list of discharge medications.  Relevant Imaging Results:  Relevant Lab Results:   Additional Information (SSN: [id number])  Jeanny Rymer, Veronia Beets, LCSW

## 2018-05-15 NOTE — Care Management Note (Signed)
Case Management Note  Patient Details  Name: Christine [name] MRN: [id number] Date of Birth: 1920-03-26  Subjective/Objective:                   Met with the patient and her daughter at the bedside, Provided the [facility] list per CMS.gov and placed a copy on the chart. The patient has used [facility] in the past and wants to use them again Has a RW at home and has no DME needs Uses [facility] [place] PCP is Dr. Candiss Norse Family provides transportation lives at home with family  Action/Plan: [facility] List provided to the patient and family at bedside, chooses [facility] for [facility] PT, NO DME needs Notified Corene Cornea with [facility] of choice bia secure email  Expected Discharge Date:                  Expected Discharge Plan:     In-House Referral:     Discharge planning Services  CM Consult  Post Acute Care Choice:  Home Health Choice offered to:  Patient, Adult Children  DME Arranged:    DME Agency:     HH Arranged:  PT [place] Agency:  [facility]  Status of Service:  In process, will continue to follow  If discussed at Long Length of Stay Meetings, dates discussed:    Additional Comments:  Su Hilt, RN 05/15/2018, 11:54 AM

## 2018-05-15 NOTE — Progress Notes (Signed)
[place] at [place] NAME: Christine [name]    MR#:  [id number]  DATE OF BIRTH:  18-Jun-1920  SUBJECTIVE:  CHIEF COMPLAINT:   Chief Complaint  Patient presents with  . Fall   Patient reported to have had one episode of vomiting but resolved.  Was given morphine for pain control.  Patient admitted with left hip fracture secondary to mechanical fall.  Scheduled for surgery this afternoon.  Updated family member present at bedside on treatment plans.  REVIEW OF SYSTEMS:  Review of Systems  Constitutional: Negative for chills and fever.  HENT: Negative for hearing loss and tinnitus.   Eyes: Negative for blurred vision and double vision.  Respiratory: Negative for cough and hemoptysis.   Cardiovascular: Negative for chest pain, palpitations and orthopnea.  Gastrointestinal: Negative for heartburn, nausea and vomiting.  Genitourinary: Negative for dysuria and urgency.  Musculoskeletal: Negative for myalgias and neck pain.       Some pain in the site of left hip fracture.  Skin: Negative for itching and rash.  Neurological: Negative for dizziness and headaches.  Psychiatric/Behavioral: Negative for depression and suicidal ideas.    DRUG ALLERGIES:   Allergies  Allergen Reactions  . Adhesive [Tape] Other (See Comments)    Red itchy  . Tapentadol Other (See Comments)    Red itchy   VITALS:  Blood pressure (!) 115/51, pulse 95, temperature (!) 96.5 F (35.8 C), resp. rate 16, height 5\' 7"  (1.702 m), weight 59 kg, SpO2 95 %. PHYSICAL EXAMINATION:   Physical Exam  Constitutional: She is oriented to person, place, and time. She appears well-developed and well-nourished.  HENT:  Head: Normocephalic and atraumatic.  Right Ear: External ear normal.  Eyes: Pupils are equal, round, and reactive to light. Conjunctivae and EOM are normal.  Neck: Normal range of motion. Neck supple. No tracheal deviation present.  Cardiovascular: Normal rate,  regular rhythm and normal heart sounds.  Respiratory: Effort normal and breath sounds normal. She has no wheezes.  GI: Soft. Bowel sounds are normal. There is no abdominal tenderness.  Musculoskeletal:        General: No edema.     Comments: Limitation of movement of left lower extremity secondary to recent hip fracture  Neurological: She is alert and oriented to person, place, and time. No cranial nerve deficit.  Muted movement of left lower extremity secondary to left hip fracture  Skin: Skin is warm. She is not diaphoretic. No erythema.  Psychiatric: She has a normal mood and affect. Her behavior is normal.   LABORATORY PANEL:  Female CBC Recent Labs  Lab 05/14/18 2208  WBC 10.5  HGB 12.0  HCT 35.8*  PLT 247   ------------------------------------------------------------------------------------------------------------------ Chemistries  Recent Labs  Lab 05/14/18 2331  NA 135  K 3.7  CL 101  CO2 26  GLUCOSE 144*  BUN 16  CREATININE 0.92  CALCIUM 8.4*   RADIOLOGY:  Dg Tibia/fibula Right  Result Date: 05/14/2018 CLINICAL DATA:  Fall EXAM: RIGHT TIBIA AND FIBULA - 2 VIEW COMPARISON:  None. FINDINGS: There is no evidence of fracture or other focal bone lesions. Soft tissues are unremarkable. IMPRESSION: Negative. Electronically Signed   By: Rolm Baptise M.D.   On: 05/14/2018 23:11   US Venous Img Lower Unilateral Left  Result Date: 05/15/2018 CLINICAL DATA:  Pain x1 week EXAM: LEFT LOWER EXTREMITY VENOUS DOPPLER ULTRASOUND TECHNIQUE: Gray-scale sonography with compression, as well as color and duplex ultrasound, were performed to evaluate  the deep venous system from the level of the common femoral vein through the popliteal and proximal calf veins. COMPARISON:  10/24/2017 FINDINGS: Normal compressibility of the common femoral, superficial femoral, and popliteal veins. Incomplete compressibility of 1 of the left posterior tibial veins. No filling defects to suggest DVT on  grayscale or color Doppler imaging. Doppler waveforms show normal direction of venous flow, normal respiratory phasicity and response to augmentation. Survey views of the contralateral common femoral vein demonstrates relatively monophasic waveform, no thrombus. IMPRESSION: 1. No acute femoropopliteal or calf DVT . If clinical symptoms are inconsistent or if there are persistent or worsening symptoms, further imaging (possibly involving the iliac veins) may be warranted. 2. 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 Electronically Signed   By: Lucrezia Europe M.D.   On: 05/15/2018 08:55   Dg Chest Portable 1 View  Result Date: 05/14/2018 CLINICAL DATA:  Fall EXAM: PORTABLE CHEST 1 VIEW COMPARISON:  10/21/2016 FINDINGS: Cardiomegaly. No confluent airspace opacities, effusions or edema. No acute bony abnormality. IMPRESSION: Cardiomegaly.  No active disease. Electronically Signed   By: Rolm Baptise M.D.   On: 05/14/2018 23:12   Dg Hip Unilat W Or Wo Pelvis 2-3 Views Left  Result Date: 05/14/2018 CLINICAL DATA:  Fall.  Hip pain. EXAM: DG HIP (WITH OR WITHOUT PELVIS) 2-3V LEFT COMPARISON:  10/24/2016 FINDINGS: Prior right hip replacement. There is a left intertrochanteric femoral fracture with varus angulation and mild displacement of fracture fragments. No subluxation or dislocation. IMPRESSION: Left femoral intertrochanteric fracture with varus angulation and mild displacement. Electronically Signed   By: Rolm Baptise M.D.   On: 05/14/2018 23:11   ASSESSMENT AND PLAN:   Christine [name]  is a 85 y.o. female with a known history of impaired hearing, colon cancer, left leg DVT diagnosed in August 2019 on Xarelto took last dose of 20 mg the morning of 05/14/2018 , hypothyroidism comes to the emergency room after she had a mechanical fall at home. Patient lives with granddaughter.   1. Preop evaluation for left femoral intertrochanteric fracture -admitted to orthopedic floor -IV fluids.   Patient seen by tabetic physician already.  Scheduled for surgery today.  Xarelto already placed on hold.-- patient took last dose 05/14/2018 in the morning -PRN pain meds -patient does not have cardiac history per granddaughter. She is hemodynamically stable. -She is at a low to intermediate risk for surgery. Family understands the risk and benefits for surgery  2. History of left posterior tibial vein DVT in August 2019 -patient has been on Xarelto -ultrasound left lower extremity done this morning.  No acute femoral popliteal or calf DVT.  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    3. Hypothyroidism continue Synthroid  4. DVT prophylaxis SCD-- no antiplatelet since surgery pending  Disposition; patient would likely require rehab placement after surgery   All the records are reviewed and case discussed with Care Management/Social Worker. Management plans discussed with the patient, family and they are in agreement.  CODE STATUS: Full Code  TOTAL TIME TAKING CARE OF THIS PATIENT: 28 minutes.   More than 50% of the time was spent in counseling/coordination of care: YES  POSSIBLE D/C IN 2 DAYS, DEPENDING ON CLINICAL CONDITION.   Christine [name] M.D on 05/15/2018 at 2:28 PM  Between 7am to 6pm - Pager - [phone]  After 6pm go to [email or web address] - [employer] [facility]  Office  [phone]  CC: Primary care physician;  Glendon Axe, MD  Note: This dictation was prepared with Dragon dictation along with smaller phrase technology. Any transcriptional errors that result from this process are unintentional.

## 2018-05-16 LAB — CBC
HCT: 25.4 % — ABNORMAL LOW (ref 36.0–46.0)
Hemoglobin: 8.3 g/dL — ABNORMAL LOW (ref 12.0–15.0)
MCH: 32.9 pg (ref 26.0–34.0)
MCHC: 32.7 g/dL (ref 30.0–36.0)
MCV: 100.8 fL — ABNORMAL HIGH (ref 80.0–100.0)
Platelets: 156 10*3/uL (ref 150–400)
RBC: 2.52 MIL/uL — ABNORMAL LOW (ref 3.87–5.11)
RDW: 13.7 % (ref 11.5–15.5)
WBC: 11.8 10*3/uL — ABNORMAL HIGH (ref 4.0–10.5)
nRBC: 0 % (ref 0.0–0.2)

## 2018-05-16 LAB — BASIC METABOLIC PANEL
ANION GAP: 7 (ref 5–15)
BUN: 20 mg/dL (ref 8–23)
CO2: 26 mmol/L (ref 22–32)
Calcium: 7.4 mg/dL — ABNORMAL LOW (ref 8.9–10.3)
Chloride: 101 mmol/L (ref 98–111)
Creatinine, Ser: 0.88 mg/dL (ref 0.44–1.00)
GFR calc non Af Amer: 56 mL/min — ABNORMAL LOW (ref >60)
Glucose, Bld: 120 mg/dL — ABNORMAL HIGH (ref 70–99)
Potassium: 4.4 mmol/L (ref 3.5–5.1)
Sodium: 134 mmol/L — ABNORMAL LOW (ref 135–145)

## 2018-05-16 LAB — MAGNESIUM: Magnesium: 1.9 mg/dL (ref 1.7–2.4)

## 2018-05-16 LAB — PHOSPHORUS: PHOSPHORUS: 3.6 mg/dL (ref 2.5–4.6)

## 2018-05-16 NOTE — Progress Notes (Signed)
Physical Therapy Treatment Patient Details Name: Christine [name] MRN: [id number] DOB: 1920-10-12 Today's Date: 05/16/2018    History of Present Illness 85 yo female with onset of fall resulted in L hip intertrochanteric fracture and ORIF.  Pt is in pain but WBAT, was referred to PT for monitoring of her mobility regarding request to go home.  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  After stretching with assist, pt could stand with mod assist on RW.  Her plan is to continue on with mobility tomorrow, using RW to try stairs and also BSC to help with urine incontinence.  Pt is motivated to work, and should make good progress in a rehab setting before going home.  Focus on standing balance control and steps with RW before transition to SNF.   Follow Up Recommendations  SNF     Equipment Recommendations  Rolling walker with 5" wheels    Recommendations for Other Services       Precautions / Restrictions Precautions Precautions: Fall(R leg wound, telemetry, O2) Precaution Comments: HOH,  Restrictions Weight Bearing Restrictions: Yes Other Position/Activity Restrictions: WBAT    Mobility  Bed Mobility               General bed mobility comments: up in chair  Transfers Overall transfer level: Needs assistance Equipment used: Rolling walker (2 wheeled);1 person hand held assist Transfers: Sit to/from Stand Sit to Stand: Mod assist         General transfer comment: mod assist with RW for support  Ambulation/Gait             General Gait Details: unable   Stairs             Wheelchair Mobility    Modified Rankin (Stroke Patients Only)       Balance Overall balance assessment: Needs assistance;History of Falls Sitting-balance support: Feet  supported;Bilateral upper extremity supported Sitting balance-Leahy Scale: Fair     Standing balance support: Bilateral upper extremity supported;During functional activity Standing balance-Leahy Scale: Poor                              Cognition Arousal/Alertness: Awake/alert Behavior During Therapy: Anxious;WFL for tasks assessed/performed Overall Cognitive Status: Within Functional Limits for tasks assessed                                 General Comments: pt reports wanting to stand despite her pain      Exercises      General Comments        Pertinent Vitals/Pain Pain Assessment: Faces Faces Pain Scale: Hurts even more Pain Location: L hip to move Pain Descriptors / Indicators: Operative site guarding Pain Intervention(s): Limited activity within patient's tolerance;Monitored during session;Premedicated before session;Repositioned;Ice applied    Home Living                      Prior Function            PT Goals (current goals can now be found in the care plan section) Acute Rehab PT Goals Patient Stated Goal: to get up and  walk PT Goal Formulation: With patient/family Progress towards PT goals: Progressing toward goals    Frequency    BID      PT Plan Current plan remains appropriate    Co-evaluation              AM-PAC PT "6 Clicks" Mobility   Outcome Measure  Help needed turning from your back to your side while in a flat bed without using bedrails?: A Little Help needed moving from lying on your back to sitting on the side of a flat bed without using bedrails?: A Lot Help needed moving to and from a bed to a chair (including a wheelchair)?: A Lot Help needed standing up from a chair using your arms (e.g., wheelchair or bedside chair)?: A Lot Help needed to walk in hospital room?: Total Help needed climbing 3-5 steps with a railing? : Total 6 Click Score: 11    End of Session Equipment Utilized During  Treatment: Gait belt;Oxygen Activity Tolerance: Patient tolerated treatment well;Patient limited by pain Patient left: in chair;with call bell/phone within reach;with chair alarm set;with family/visitor present Nurse Communication: Mobility status;Patient requests pain meds PT Visit Diagnosis: Unsteadiness on feet (R26.81);Repeated falls (R29.6);Muscle weakness (generalized) (M62.81);Difficulty in walking, not elsewhere classified (R26.2);Pain Pain - Right/Left: Left Pain - part of body: Hip     Time: 7357-8978 PT Time Calculation (min) (ACUTE ONLY): 27 min  Charges:  $Therapeutic Activity: 23-37 mins                    Ramond Dial 05/16/2018, 3:33 PM  Mee Hives, PT MS Acute Rehab Dept. Number: [place] and [place]

## 2018-05-16 NOTE — Progress Notes (Signed)
[place] at [place] NAME: Christine [name]    MR#:  [id number]  DATE OF BIRTH:  January 01, 1921  SUBJECTIVE:  CHIEF COMPLAINT:   Chief Complaint  Patient presents with  . Fall  in some pain but meds help. Patient admitted with left hip fracture secondary to mechanical fall. s/p surgery  POD 1.  Updated family member present at bedside on treatment/discharge plans. They would like to take her home at D/C - they have full support at home REVIEW OF SYSTEMS:  Review of Systems  Constitutional: Negative for chills and fever.  HENT: Negative for hearing loss and tinnitus.   Eyes: Negative for blurred vision and double vision.  Respiratory: Negative for cough and hemoptysis.   Cardiovascular: Negative for chest pain, palpitations and orthopnea.  Gastrointestinal: Negative for heartburn, nausea and vomiting.  Genitourinary: Negative for dysuria and urgency.  Musculoskeletal: Negative for myalgias and neck pain.       Some pain in the site of left hip fracture.  Skin: Negative for itching and rash.  Neurological: Negative for dizziness and headaches.  Psychiatric/Behavioral: Negative for depression and suicidal ideas.    DRUG ALLERGIES:   Allergies  Allergen Reactions  . Adhesive [Tape] Other (See Comments)    Red itchy  . Tapentadol Other (See Comments)    Red itchy   VITALS:  Blood pressure 105/65, pulse 84, temperature 97.6 F (36.4 C), temperature source Oral, resp. rate 18, height 5\' 7"  (1.702 m), weight 59 kg, SpO2 99 %. PHYSICAL EXAMINATION:   Physical Exam  Constitutional: She is oriented to person, place, and time. She appears well-developed and well-nourished.  HENT:  Head: Normocephalic and atraumatic.  Right Ear: External ear normal.  Eyes: Pupils are equal, round, and reactive to light. Conjunctivae and EOM are normal.  Neck: Normal range of motion. Neck supple. No tracheal deviation present.  Cardiovascular: Normal rate,  regular rhythm and normal heart sounds.  Respiratory: Effort normal and breath sounds normal. She has no wheezes.  GI: Soft. Bowel sounds are normal. There is no abdominal tenderness.  Musculoskeletal:        General: No edema.     Comments: Post surgical dressing in place  Neurological: She is alert and oriented to person, place, and time. No cranial nerve deficit.  Skin: Skin is warm. She is not diaphoretic. No erythema.  Psychiatric: She has a normal mood and affect. Her behavior is normal.   LABORATORY PANEL:  Female CBC Recent Labs  Lab 05/16/18 0658  WBC 11.8*  HGB 8.3*  HCT 25.4*  PLT 156   ------------------------------------------------------------------------------------------------------------------ Chemistries  Recent Labs  Lab 05/16/18 0658  NA 134*  K 4.4  CL 101  CO2 26  GLUCOSE 120*  BUN 20  CREATININE 0.88  CALCIUM 7.4*  MG 1.9   RADIOLOGY:  No results found. ASSESSMENT AND PLAN:  Christine [name]  is a 85 y.o. female with a known history of impaired hearing, colon cancer, left leg DVT diagnosed in August 2019 on Xarelto took last dose of 20 mg the morning of 05/14/2018 , hypothyroidism comes to the emergency room after she had a mechanical fall at home. Patient lives with granddaughter.   1. Left femoral intertrochanteric fracture -s/p surgery POD 1, on xarelto  2. History of left posterior tibial vein DVT in August 2019 - continue Xarelto  3. Hypothyroidism continue Synthroid  4. Anemia of chronic dz: Hb 8.3, monitor  DVT prophylaxis SCD-- no antiplatelet  since surgery pending  Disposition; patient & family would like to go home with HHPT, RN - I support this.   All the records are reviewed and case discussed with Care Management/Social Worker. Management plans discussed with the patient, family at bedside and they are in agreement.  CODE STATUS: DNR  TOTAL TIME TAKING CARE OF THIS PATIENT: 25 minutes.   More than 50% of the time was  spent in counseling/coordination of care: YES  POSSIBLE D/C IN 2 DAYS, DEPENDING ON CLINICAL CONDITION.   Max Sane M.D on 05/16/2018 at 3:12 PM  Between 7am to 6pm - Pager - [phone]  After 6pm go to [email or web address] - Proofreader  [facility]  Office  [phone]  CC: Primary care physician; Glendon Axe, MD  Note: This dictation was prepared with Dragon dictation along with smaller phrase technology. Any transcriptional errors that result from this process are unintentional.

## 2018-05-16 NOTE — Clinical Social Work Note (Signed)
CSW consulted for SNF placement. Per MD, patient will go home with family. She will need home health and outpatient palliative to follow. CSW notified RNCM of need. CSW signing off. Please reconsult if further needs arise.   [place], Laureles

## 2018-05-16 NOTE — Progress Notes (Signed)
Family Meeting Note  Advance Directive:yes  Today a meeting took place with the Patient and Daughter, Nephew at bedside.   The following clinical team members were present during this meeting:MD  The following were discussed:Patient's diagnosis: 85 y.o.femalewith a known history of impaired hearing, colon cancer, left leg DVT diagnosed in August 2019 on Xarelto took last dose of 20 mg the morning of 05/14/2018 , hypothyroidism admitted for left femoral intertrochanteric fracture s/p Reduction and internal fixation of displaced intertrochanteric left hip fracture with AOS TFN nail. she had a mechanical fall at home. Patient lives with granddaughter and has enough support at home. She and family would like for her to be at Home upon D/C.   Past Medical History:  Diagnosis Date  . Cancer ([place])    colon  . Carpal tunnel syndrome   . Complication of anesthesia    hard time waking up after thyroidectomy  . DJD (degenerative joint disease)   . Family history of adverse reaction to anesthesia    daughter PONV  . Hearing loss   . Hypothyroidism   . Osteoporosis   . Wears glasses     Patient's progosis: < 12 months and Goals for treatment: DNR  Additional follow-up to be provided: Outpt Palliative care. HHPT, RN - 2 week Protocol  Time spent during discussion:16 Min  Max Sane, MD

## 2018-05-16 NOTE — Evaluation (Signed)
Physical Therapy Evaluation Patient Details Name: Christine [name] MRN: [id number] DOB: 1920-10-07 Today's Date: 05/16/2018   History of Present Illness  85 yo female with onset of fall resulted in L hip intertrochanteric fracture and ORIF.  Pt is in pain but WBAT, was referred to PT for monitoring of her mobility regarding request to go home.  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  Family wants to take her home, and talked about the stairs to enter the home.  Pt is a pivot transfer currently and would not currently be able to handle this.  Will work toward more independence of movement, but per daughter there is no ramp at the house.  Has a WC and could navigate with help on a ramp to enter the home.  Follow BID as tolerated and will progress as pt can permit with pain and weakness from fracture and surgery.    Follow Up Recommendations SNF    Equipment Recommendations  Rolling walker with 5" wheels    Recommendations for Other Services       Precautions / Restrictions Precautions Precautions: Fall(telemetry, R leg wound) Precaution Comments: HOH,  Restrictions Weight Bearing Restrictions: Yes Other Position/Activity Restrictions: WBAT      Mobility  Bed Mobility Overal bed mobility: Needs Assistance Bed Mobility: Supine to Sit     Supine to sit: Max assist     General bed mobility comments: pt initially resisted with trunk ext and agreed to letting PT use trunk support and quick pivot on bed pad to sit up side of b ed  Transfers Overall transfer level: Needs assistance Equipment used: 1 person hand held assist Transfers: Sit to/from Stand Sit to Stand: Max assist         General transfer comment: max assist due to pain and fear of the pain  Ambulation/Gait             General Gait Details:  unable to assist  Stairs            Wheelchair Mobility    Modified Rankin (Stroke Patients Only)       Balance Overall balance assessment: Needs assistance;History of Falls Sitting-balance support: Feet supported;Bilateral upper extremity supported Sitting balance-Leahy Scale: Fair     Standing balance support: Bilateral upper extremity supported;During functional activity Standing balance-Leahy Scale: Poor                               Pertinent Vitals/Pain Pain Assessment: Faces Faces Pain Scale: Hurts whole lot Pain Location: L hip wiht movement Pain Descriptors / Indicators: Operative site guarding Pain Intervention(s): Limited activity within patient's tolerance;Monitored during session;Premedicated before session;Repositioned;Ice applied    Home Living Family/patient expects to be discharged to:: Private residence Living Arrangements: Other relatives(granddaughter) Available Help at Discharge: Family;Available 24 hours/day Type of Home: House Home Access: Stairs to enter   [employer] of Steps: 2 Home Layout: One level Home Equipment: Bedside commode;Shower seat;Walker - 4 wheels;Cane - single point;Wheelchair - manual Additional Comments: pt is going to need a RW     Prior Function Level of Independence: Needs assistance   Gait / Transfers Assistance Needed: RW with no assist in the house  ADL's / Homemaking Assistance Needed: family assists her with dressing and bathing  Comments:  Pt is very limited for understanding unless very close to her and she can see your face     Hand Dominance   Dominant Hand: Right    Extremity/Trunk Assessment   Upper Extremity Assessment Upper Extremity Assessment: Generalized weakness    Lower Extremity Assessment Lower Extremity Assessment: Generalized weakness;LLE deficits/detail LLE Deficits / Details: new ORIF to L femur LLE: Unable to fully assess due to pain LLE Coordination:  decreased fine motor;decreased gross motor    Cervical / Trunk Assessment Cervical / Trunk Assessment: Kyphotic  Communication   Communication: HOH  Cognition Arousal/Alertness: Awake/alert Behavior During Therapy: Anxious Overall Cognitive Status: Within Functional Limits for tasks assessed                                 General Comments: pt was willing to try to get OOB despite her pain      General Comments General comments (skin integrity, edema, etc.): pt dropped O2 sats to 87% with room air, replaced 2L O2 with sats 93% then 97% after a rest    Exercises General Exercises - Lower Extremity Ankle Circles/Pumps: AAROM;Both;5 reps Quad Sets: AAROM;Both;5 reps Hip ABduction/ADduction: AAROM;Left;5 reps   Assessment/Plan    PT Assessment Patient needs continued PT services  PT Problem List Decreased strength;Decreased range of motion;Decreased activity tolerance;Decreased balance;Decreased mobility;Decreased coordination;Decreased knowledge of use of DME;Decreased safety awareness;Decreased knowledge of precautions;Cardiopulmonary status limiting activity;Decreased skin integrity;Pain       PT Treatment Interventions DME instruction;Gait training;Functional mobility training;Therapeutic activities;Therapeutic exercise;Balance training;Neuromuscular re-education;Patient/family education;Stair training    PT Goals (Current goals can be found in the Care Plan section)  Acute Rehab PT Goals Patient Stated Goal: to get comfortable PT Goal Formulation: With patient/family Time For Goal Achievement: 05/30/18 Potential to Achieve Goals: Good    Frequency BID   Barriers to discharge Inaccessible home environment;Decreased caregiver support home with one person    Co-evaluation               AM-PAC PT "6 Clicks" Mobility  Outcome Measure Help needed turning from your back to your side while in a flat bed without using bedrails?: A Little Help needed  moving from lying on your back to sitting on the side of a flat bed without using bedrails?: A Lot Help needed moving to and from a bed to a chair (including a wheelchair)?: A Lot Help needed standing up from a chair using your arms (e.g., wheelchair or bedside chair)?: A Lot Help needed to walk in hospital room?: Total Help needed climbing 3-5 steps with a railing? : Total 6 Click Score: 11    End of Session Equipment Utilized During Treatment: Gait belt;Oxygen Activity Tolerance: Patient tolerated treatment well;Patient limited by pain Patient left: in chair;with call bell/phone within reach;with chair alarm set;with family/visitor present Nurse Communication: Mobility status;Patient requests pain meds PT Visit Diagnosis: Unsteadiness on feet (R26.81);Repeated falls (R29.6);Muscle weakness (generalized) (M62.81);Difficulty in walking, not elsewhere classified (R26.2);Pain Pain - Right/Left: Left Pain - part of body: Hip    Time: 6294-7654 PT Time Calculation (min) (ACUTE ONLY): 39 min   Charges:   PT Evaluation $PT Eval Moderate Complexity: 1 Mod PT Treatments $Therapeutic Exercise: 8-22 mins $Therapeutic Activity: 8-22 mins       Ramond Dial 05/16/2018, 11:29 AM  Mee Hives, PT MS Acute Rehab Dept. Number: [place] [facility] [place]

## 2018-05-16 NOTE — Progress Notes (Signed)
  Subjective: 1 Day Post-Op Procedure(s) (LRB): INTRAMEDULLARY (IM) NAIL INTERTROCHANTRIC - LEFT FEMUR (Left) Patient reports pain as mild.   Patient is well, and has had no acute complaints or problems PT and Care management to assist with discharge planning. Negative for chest pain and shortness of breath Fever: no Gastrointestinal:Negative for nausea and vomiting  Objective: Vital signs in last 24 hours: Temp:  [96.5 F (35.8 C)-98.4 F (36.9 C)] 97.6 F (36.4 C) (02/29 0739) Pulse Rate:  [77-98] 84 (02/29 0739) Resp:  [13-27] 18 (02/29 0739) BP: (105-151)/(43-65) 105/65 (02/29 0739) SpO2:  [89 %-100 %] 99 % (02/29 0739) Weight:  [59 kg] 59 kg (02/28 1221)  Intake/Output from previous day:  Intake/Output Summary (Last 24 hours) at 05/16/2018 0814 Last data filed at 05/16/2018 0602 Gross per 24 hour  Intake 1343.61 ml  Output 150 ml  Net 1193.61 ml    Intake/Output this shift: No intake/output data recorded.  Labs: Recent Labs    05/14/18 2208 05/16/18 0658  HGB 12.0 8.3*   Recent Labs    05/14/18 2208 05/16/18 0658  WBC 10.5 11.8*  RBC 3.67* 2.52*  HCT 35.8* 25.4*  PLT 247 156   Recent Labs    05/14/18 2331 05/16/18 0658  NA 135 134*  K 3.7 4.4  CL 101 101  CO2 26 26  BUN 16 20  CREATININE 0.92 0.88  GLUCOSE 144* 120*  CALCIUM 8.4* 7.4*   Recent Labs    05/14/18 2208  INR 1.1     EXAM General - Patient is Alert, Appropriate and Oriented Extremity - ABD soft Sensation intact distally Intact pulses distally Dorsiflexion/Plantar flexion intact Incision: scant drainage No cellulitis present Dressing/Incision - blood tinged drainage Motor Function - intact, moving foot and toes well on exam.   Past Medical History:  Diagnosis Date  . Cancer ([place])    colon  . Carpal tunnel syndrome   . Complication of anesthesia    hard time waking up after thyroidectomy  . DJD (degenerative joint disease)   . Family history of adverse reaction to  anesthesia    daughter PONV  . Hearing loss   . Hypothyroidism   . Osteoporosis   . Wears glasses     Assessment/Plan: 1 Day Post-Op Procedure(s) (LRB): INTRAMEDULLARY (IM) NAIL INTERTROCHANTRIC - LEFT FEMUR (Left) Active Problems:   Hip fracture (HCC)  Estimated body mass index is 20.37 kg/m as calculated from the following:   Height as of this encounter: 5\' 7"  (1.702 m).   Weight as of this encounter: 59 kg. Advance diet Up with therapy D/C IV fluids when tolerating po intake.  Labs reviewed this AM, Hg 8.3 this morning.  CBC and BMP ordered for tomorrow morning.  No recent fevers. Up with therapy today. Patient is passing gas, continue to work on having a BM.  DVT Prophylaxis - Xarelto, Foot Pumps and TED hose Weight-Bearing as tolerated to left leg  J. Cameron Proud, PA-C [facility] 05/16/2018, 8:14 AM

## 2018-05-17 LAB — PROTIME-INR
INR: 3 — ABNORMAL HIGH (ref 0.8–1.2)
Prothrombin Time: 30.3 seconds — ABNORMAL HIGH (ref 11.4–15.2)

## 2018-05-17 LAB — BASIC METABOLIC PANEL
ANION GAP: 6 (ref 5–15)
BUN: 18 mg/dL (ref 8–23)
CO2: 28 mmol/L (ref 22–32)
Calcium: 7.6 mg/dL — ABNORMAL LOW (ref 8.9–10.3)
Chloride: 101 mmol/L (ref 98–111)
Creatinine, Ser: 0.86 mg/dL (ref 0.44–1.00)
GFR calc Af Amer: >60 mL/min (ref >60)
GFR calc non Af Amer: 57 mL/min — ABNORMAL LOW (ref >60)
GLUCOSE: 122 mg/dL — AB (ref 70–99)
Potassium: 4.1 mmol/L (ref 3.5–5.1)
Sodium: 135 mmol/L (ref 135–145)

## 2018-05-17 LAB — CBC
HCT: 22.7 % — ABNORMAL LOW (ref 36.0–46.0)
HCT: 27.2 % — ABNORMAL LOW (ref 36.0–46.0)
HEMOGLOBIN: 9.1 g/dL — AB (ref 12.0–15.0)
Hemoglobin: 7.3 g/dL — ABNORMAL LOW (ref 12.0–15.0)
MCH: 32.7 pg (ref 26.0–34.0)
MCH: 32.3 pg (ref 26.0–34.0)
MCHC: 32.2 g/dL (ref 30.0–36.0)
MCHC: 33.5 g/dL (ref 30.0–36.0)
MCV: 101.8 fL — ABNORMAL HIGH (ref 80.0–100.0)
MCV: 96.5 fL (ref 80.0–100.0)
Platelets: 146 10*3/uL — ABNORMAL LOW (ref 150–400)
Platelets: 150 10*3/uL (ref 150–400)
RBC: 2.23 MIL/uL — ABNORMAL LOW (ref 3.87–5.11)
RBC: 2.82 MIL/uL — AB (ref 3.87–5.11)
RDW: 13.6 % (ref 11.5–15.5)
RDW: 15.3 % (ref 11.5–15.5)
WBC: 11.3 10*3/uL — ABNORMAL HIGH (ref 4.0–10.5)
WBC: 13.1 10*3/uL — ABNORMAL HIGH (ref 4.0–10.5)
nRBC: 0 % (ref 0.0–0.2)
nRBC: 0 % (ref 0.0–0.2)

## 2018-05-17 LAB — PREPARE RBC (CROSSMATCH)

## 2018-05-17 LAB — FIBRINOGEN: Fibrinogen: 579 mg/dL — ABNORMAL HIGH (ref 210–475)

## 2018-05-17 MED ORDER — SODIUM CHLORIDE 0.9% IV SOLUTION: Freq: Once | INTRAVENOUS | Status: DC

## 2018-05-17 NOTE — Progress Notes (Signed)
  Subjective: 2 Days Post-Op Procedure(s) (LRB): INTRAMEDULLARY (IM) NAIL INTERTROCHANTRIC - LEFT FEMUR (Left) Patient reports pain as mild.   Patient is well, and has had no acute complaints or problems PT currently recommending SNF but family states they have support at home to be able to take the patient home upon discharge. Negative for chest pain and shortness of breath.  No dizziness this AM. Fever: no Gastrointestinal:Negative for nausea and vomiting  Objective: Vital signs in last 24 hours: Temp:  [98.2 F (36.8 C)-98.6 F (37 C)] 98.2 F (36.8 C) (03/01 0745) Pulse Rate:  [80-97] 82 (03/01 0745) Resp:  [16-19] 18 (02/29 2348) BP: (114-134)/(45-57) 133/54 (03/01 0745) SpO2:  [92 %-100 %] 98 % (03/01 0745)  Intake/Output from previous day:  Intake/Output Summary (Last 24 hours) at 05/17/2018 0902 Last data filed at 05/17/2018 0620 Gross per 24 hour  Intake -  Output 500 ml  Net -500 ml    Intake/Output this shift: No intake/output data recorded.  Labs: Recent Labs    05/14/18 2208 05/16/18 0658 05/17/18 0421  HGB 12.0 8.3* 7.3*   Recent Labs    05/16/18 0658 05/17/18 0421  WBC 11.8* 11.3*  RBC 2.52* 2.23*  HCT 25.4* 22.7*  PLT 156 146*   Recent Labs    05/16/18 0658 05/17/18 0421  NA 134* 135  K 4.4 4.1  CL 101 101  CO2 26 28  BUN 20 18  CREATININE 0.88 0.86  GLUCOSE 120* 122*  CALCIUM 7.4* 7.6*   Recent Labs    05/14/18 2208  INR 1.1     EXAM General - Patient is Alert, Appropriate and Oriented.  Patient does appear pale this AM. Extremity - ABD soft Sensation intact distally Intact pulses distally Dorsiflexion/Plantar flexion intact Incision: scant drainage No cellulitis present Dressing/Incision -old blood tinged drainage Motor Function - intact, moving foot and toes well on exam.   Past Medical History:  Diagnosis Date  . Cancer ([place])    colon  . Carpal tunnel syndrome   . Complication of anesthesia    hard time waking up  after thyroidectomy  . DJD (degenerative joint disease)   . Family history of adverse reaction to anesthesia    daughter PONV  . Hearing loss   . Hypothyroidism   . Osteoporosis   . Wears glasses     Assessment/Plan: 2 Days Post-Op Procedure(s) (LRB): INTRAMEDULLARY (IM) NAIL INTERTROCHANTRIC - LEFT FEMUR (Left) Active Problems:   Hip fracture (HCC)  Estimated body mass index is 20.37 kg/m as calculated from the following:   Height as of this encounter: 5\' 7"  (1.702 m).   Weight as of this encounter: 59 kg. Advance diet Up with therapy  Labs reviewed this AM, Hg 7.3 this morning.  Internal medicine has ordered a blood transfusion for the patient. Up with therapy today. Patient is passing gas, continue to work on having a BM.  DVT Prophylaxis - Xarelto, Foot Pumps and TED hose Weight-Bearing as tolerated to left leg  J. Cameron Proud, PA-C [facility] 05/17/2018, 9:02 AM

## 2018-05-17 NOTE — Progress Notes (Signed)
Hemoglobin 7.3 this Am. Casimer Bilis, NP made aware. No new orders received at this time.

## 2018-05-17 NOTE — Progress Notes (Signed)
Christine [name]: Christine [name]    MR#:  [id number]  DATE OF BIRTH:  1920-03-24  SUBJECTIVE:  CHIEF COMPLAINT:   Chief Complaint  Patient presents with  . Fall  sleepy. Patient admitted with left hip fracture secondary to mechanical fall. s/p surgery  POD 2.  Updated family member present at bedside on treatment/discharge plans. They would like to take her home at D/C - they have full support at home. Hb 7.3 and waiting for transfusion this morning REVIEW OF SYSTEMS:  Review of Systems  Constitutional: Negative for chills and fever.  HENT: Negative for hearing loss and tinnitus.   Eyes: Negative for blurred vision and double vision.  Respiratory: Negative for cough and hemoptysis.   Cardiovascular: Negative for chest pain, palpitations and orthopnea.  Gastrointestinal: Negative for heartburn, nausea and vomiting.  Genitourinary: Negative for dysuria and urgency.  Musculoskeletal: Negative for myalgias and neck pain.       Some pain in the site of left hip fracture.  Skin: Negative for itching and rash.  Neurological: Negative for dizziness and headaches.  Psychiatric/Behavioral: Negative for depression and suicidal ideas.    DRUG ALLERGIES:   Allergies  Allergen Reactions  . Adhesive [Tape] Other (See Comments)    Red itchy  . Tapentadol Other (See Comments)    Red itchy   VITALS:  Blood pressure (!) 133/54, pulse 82, temperature 98.2 F (36.8 C), temperature source Oral, resp. rate 18, height 5\' 7"  (1.702 m), weight 59 kg, SpO2 98 %. PHYSICAL EXAMINATION:   Physical Exam  Constitutional: She is oriented to person, place, and time. She appears well-developed and well-nourished.  HENT:  Head: Normocephalic and atraumatic.  Right Ear: External ear normal.  Eyes: Pupils are equal, round, and reactive to light. Conjunctivae and EOM are normal.  Neck: Normal range of motion. Neck supple. No tracheal deviation present.    Cardiovascular: Normal rate, regular rhythm and normal heart sounds.  Respiratory: Effort normal and breath sounds normal. She has no wheezes.  GI: Soft. Bowel sounds are normal. There is no abdominal tenderness.  Musculoskeletal:        General: No edema.     Comments: Post surgical dressing in place  Neurological: She is alert and oriented to person, place, and time. No cranial nerve deficit.  Skin: Skin is warm. She is not diaphoretic. No erythema.  Psychiatric: She has a normal mood and affect. Her behavior is normal.   LABORATORY PANEL:  Female CBC Recent Labs  Lab 05/17/18 0421  WBC 11.3*  HGB 7.3*  HCT 22.7*  PLT 146*   ------------------------------------------------------------------------------------------------------------------ Chemistries  Recent Labs  Lab 05/16/18 0658 05/17/18 0421  NA 134* 135  K 4.4 4.1  CL 101 101  CO2 26 28  GLUCOSE 120* 122*  BUN 20 18  CREATININE 0.88 0.86  CALCIUM 7.4* 7.6*  MG 1.9  --    RADIOLOGY:  No results found. ASSESSMENT AND PLAN:  Christine [name]  is a 85 y.o. female with a known history of impaired hearing, colon cancer, left leg DVT diagnosed in August 2019 on Xarelto took last dose of 20 mg the morning of 05/14/2018 , hypothyroidism comes to the emergency room after she had a mechanical fall at home. Patient lives with granddaughter.   1. Left femoral intertrochanteric fracture -s/p surgery POD 2, on xarelto  2. History of left posterior tibial vein DVT in August 2019 - continue Xarelto  3.  Hypothyroidism continue Synthroid  4. Anemia of chronic dz: Hb 7.3 - 1 RBC ordered. Monitor, No obvious bleeding. Could be intraop. If drops further- may have to hold xarelto  5. DVT prophylaxis SCD-- no antiplatelet since surgery pending  Disposition; patient & family would like to go home with HHPT, RN - I support this.    All the records are reviewed and case discussed with Care Management/Social Worker. Management  plans discussed with the patient, family at bedside and they are in agreement.  CODE STATUS: DNR  TOTAL TIME TAKING CARE OF THIS PATIENT: 25 minutes.   More than 50% of the time was spent in counseling/coordination of care: YES  POSSIBLE D/C IN 2 DAYS, DEPENDING ON CLINICAL CONDITION.   Max Sane M.D on 05/17/2018 at 11:27 AM  Between 7am to 6pm - Pager - [phone]  After 6pm go to [email or web address] - Proofreader  [facility]  Office  [phone]  CC: Primary care physician; Glendon Axe, MD  Note: This dictation was prepared with Dragon dictation along with smaller phrase technology. Any transcriptional errors that result from this process are unintentional.

## 2018-05-17 NOTE — Progress Notes (Signed)
Physical Therapy Treatment Patient Details Name: Christine [name] MRN: [id number] DOB: 12/30/1920 Today's Date: 05/17/2018    History of Present Illness 85 yo female with onset of fall resulted in L hip intertrochanteric fracture and ORIF.  Pt is in pain but WBAT, was referred to PT for monitoring of her mobility regarding request to go home.  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  Patient with limited tolerance for any movement of L LE, screaming out in pain. Requiring max assist +2 for all bed mobility, and attempt to sit on edge of bed. Patient unable to sit due to pain and threw herself back into supine. Patient will benefit from continued skilled PT to improve mobility, address her pain and return to her prior functional level.        Follow Up Recommendations  SNF     Equipment Recommendations  Rolling walker with 5" wheels    Recommendations for Other Services       Precautions / Restrictions Precautions Precautions: Fall Restrictions Weight Bearing Restrictions: Yes LLE Weight Bearing: Weight bearing as tolerated    Mobility  Bed Mobility Overal bed mobility: Needs Assistance Bed Mobility: Rolling;Supine to Sit;Sit to Supine Rolling: Max assist;+2 for physical assistance   Supine to sit: Max assist Sit to supine: Max assist   General bed mobility comments: due to pain, patient unwilling/unable to sit up fully on side of bed.   Transfers                 General transfer comment: unable to transfer today, patient receiving blood transfusion.  Ambulation/Gait             General Gait Details: deferred due to blood transfusion    Stairs             Wheelchair Mobility    Modified Rankin (Stroke Patients Only)       Balance Overall  balance assessment: Needs assistance;History of Falls   Sitting balance-Leahy Scale: Zero                                      Cognition Arousal/Alertness: Awake/alert Behavior During Therapy: Anxious Overall Cognitive Status: Within Functional Limits for tasks assessed                                        Exercises Total Joint Exercises Ankle Circles/Pumps: AAROM;10 reps;Both Gluteal Sets: AROM;10 reps Heel Slides: AAROM;10 reps;Both Hip ABduction/ADduction: AAROM;10 reps;Both    General Comments        Pertinent Vitals/Pain Faces Pain Scale: Hurts worst Pain Location: L hip to move- screams in pain with any movement of L LE  Pain Descriptors / Indicators: Aching;Discomfort;Operative site guarding Pain Intervention(s): Limited activity within patient's tolerance;Monitored during session;Premedicated before session;Repositioned    Home Living Family/patient expects to be discharged to:: Private residence Living Arrangements: Other relatives Available Help at Discharge: Family;Available 24 hours/day Type of Home: House Home Access: Stairs to enter Entrance Stairs-Rails: Right Home Layout: One level Home Equipment: Bedside commode;Shower seat;Walker - 4 wheels;Cane - single point;Wheelchair - manual Additional Comments: pt is  going to need a RW     Prior Function Level of Independence: Needs assistance  Gait / Transfers Assistance Needed: RW with no assist in the house ADL's / Homemaking Assistance Needed: family assists her with dressing and bathing Comments: Pt is very limited for understanding unless very close to her and she can see your face   PT Goals (current goals can now be found in the care plan section) Acute Rehab PT Goals Patient Stated Goal: to get up and walk PT Goal Formulation: With patient/family Time For Goal Achievement: 05/30/18 Potential to Achieve Goals: Fair Progress towards PT goals: Not progressing toward  goals - comment(pain limited)    Frequency    BID      PT Plan Current plan remains appropriate    Co-evaluation              AM-PAC PT "6 Clicks" Mobility   Outcome Measure  Help needed turning from your back to your side while in a flat bed without using bedrails?: Total Help needed moving from lying on your back to sitting on the side of a flat bed without using bedrails?: Total Help needed moving to and from a bed to a chair (including a wheelchair)?: Total Help needed standing up from a chair using your arms (e.g., wheelchair or bedside chair)?: Total Help needed to walk in hospital room?: Total Help needed climbing 3-5 steps with a railing? : Total 6 Click Score: 6    End of Session Equipment Utilized During Treatment: Oxygen Activity Tolerance: Patient limited by pain Patient left: in bed;with call bell/phone within reach;with bed alarm set Nurse Communication: Mobility status PT Visit Diagnosis: Muscle weakness (generalized) (M62.81);Unsteadiness on feet (R26.81);Difficulty in walking, not elsewhere classified (R26.2);Pain Pain - Right/Left: Left Pain - part of body: Hip     Time: 1405-1440 PT Time Calculation (min) (ACUTE ONLY): 35 min  Charges:  $Therapeutic Exercise: 8-22 mins $Therapeutic Activity: 8-22 mins                     Liset Mcmonigle, PT, GCS 05/17/18,2:48 PM

## 2018-05-17 NOTE — Anesthesia Postprocedure Evaluation (Signed)
Anesthesia Post Note  Patient: Christine [name]  Procedure(s) Performed: INTRAMEDULLARY (IM) NAIL INTERTROCHANTRIC - LEFT FEMUR (Left Hip)  Patient location during evaluation: PACU Anesthesia Type: General Level of consciousness: awake and alert Pain management: pain level controlled Vital Signs Assessment: post-procedure vital signs reviewed and stable Respiratory status: spontaneous breathing, nonlabored ventilation, respiratory function stable and patient connected to nasal cannula oxygen Cardiovascular status: blood pressure returned to baseline and stable Postop Assessment: no apparent nausea or vomiting Anesthetic complications: no     Last Vitals:  Vitals:   05/16/18 2007 05/16/18 2348  BP: (!) 114/50 (!) 134/57  Pulse: 97 95  Resp: 19 18  Temp: 36.9 C 36.8 C  SpO2: 92% 96%    Last Pain:  Vitals:   05/17/18 0400  TempSrc:   PainSc: Asleep                 Precious Haws Kyomi Hector

## 2018-05-18 LAB — BASIC METABOLIC PANEL
Anion gap: 6 (ref 5–15)
BUN: 18 mg/dL (ref 8–23)
CHLORIDE: 100 mmol/L (ref 98–111)
CO2: 28 mmol/L (ref 22–32)
Calcium: 7.7 mg/dL — ABNORMAL LOW (ref 8.9–10.3)
Creatinine, Ser: 0.75 mg/dL (ref 0.44–1.00)
GFR calc Af Amer: >60 mL/min (ref >60)
GFR calc non Af Amer: >60 mL/min (ref >60)
Glucose, Bld: 117 mg/dL — ABNORMAL HIGH (ref 70–99)
Potassium: 4.2 mmol/L (ref 3.5–5.1)
Sodium: 134 mmol/L — ABNORMAL LOW (ref 135–145)

## 2018-05-18 LAB — TYPE AND SCREEN
ABO/RH(D): A POS
Antibody Screen: NEGATIVE
Unit division: 0

## 2018-05-18 LAB — BPAM RBC
Blood Product Expiration Date: 202003082359
ISSUE DATE / TIME: 202003011146
Unit Type and Rh: 600

## 2018-05-18 MED ORDER — OXYCODONE HCL 5 MG PO TABS: 5.0000 mg | ORAL_TABLET | Freq: Two times a day (BID) | ORAL | 0 refills | Status: DC | PRN

## 2018-05-18 MED ORDER — DOCUSATE SODIUM 100 MG PO CAPS: 100.0000 mg | ORAL_CAPSULE | Freq: Two times a day (BID) | ORAL | 0 refills | Status: DC

## 2018-05-18 MED ORDER — POLYETHYLENE GLYCOL 3350 17 G PO PACK: 17.0000 g | PACK | Freq: Every day | ORAL | 0 refills | Status: AC | PRN

## 2018-05-18 MED ORDER — TRAMADOL HCL 50 MG PO TABS: 50.0000 mg | ORAL_TABLET | Freq: Four times a day (QID) | ORAL | 0 refills | Status: DC | PRN

## 2018-05-18 NOTE — Care Management (Signed)
Requested Dr. Manuella Ghazi to consider an order for Hospital bed, Notified Corene Cornea with [facility] of the order

## 2018-05-18 NOTE — Care Management (Signed)
Notified Corene Cornea with [facility] that the patient will need the 2 week protocol, requested if they are unable to provide that to let me know so that I can set up with another agency

## 2018-05-18 NOTE — Care Management (Signed)
Patientsuffersfromchronic Hip pain and Spinepainwhichiscausedby Hip fracture and osteoporosis. Hospitalbedwillhelp to alleviate painbyallowing the hip and spine tobepositioned  inwaysnotfeasiblewithanormalbed.Painepisodes frequentlyrequire changesinbodypositionwhichcannotbeachievedwithanormalbed

## 2018-05-18 NOTE — Care Management (Signed)
Spoke to [place] Daughter [name] at [phone], She is a Marine scientist with [employer] and the Daughter is a CNA, They do not want to change companies, they want to Stay with [facility] for PT and Aide, I notified Corene Cornea with [facility] thru secure email of the Patient's and family's  wishes

## 2018-05-18 NOTE — Care Management Important Message (Signed)
Important Message  Patient Details  Name: Christine [name] MRN: [id number] Date of Birth: 01-11-1921   Medicare Important Message Given:  Yes    [name] A Kalep Full 05/18/2018, 10:39 AM

## 2018-05-18 NOTE — Discharge Instructions (Signed)
Hip Fracture Treated With ORIF, Care After This sheet gives you information about how to care for yourself after your procedure. Your health care provider may also give you more specific instructions. If you have problems or questions, contact your health care provider. What can I expect after the procedure? After the procedure, it is common to have:  Pain. You will be given medicines to treat this.  Swelling.  Difficulty walking.  Some redness or bruising around the incision.  A small amount of fluid or blood from the incision. Follow these instructions at home: Medicines  Take over-the-counter and prescription medicines only as told by your health care provider.  You may be given a blood thinner to take for up to six weeks. This will help reduce the risk of developing a blood clot. It is important to use this medicine exactly as directed.  You may be given calcium and vitamin D supplements to strengthen your bones.  If you are taking prescription pain medicine, take actions to prevent or treat constipation. Your health care provider may recommend that you: ? Drink enough fluid to keep your urine pale yellow. ? Eat foods that are high in fiber, such as fresh fruits and vegetables, whole grains, and beans. ? Limit foods that are high in fat and processed sugars, such as fried or sweet foods. ? Take an over-the-counter or prescription medicine for constipation. Bathing  Do not take baths, swim, or use a hot tub until your health care provider approves. Ask your health care provider if you can take showers. You may only be allowed to take sponge baths.  Keep the bandage (dressing) dry until your health care provider says it can be removed. Incision care   Follow instructions from your health care provider about how to take care of your incision. Make sure you: ? Wash your hands with soap and water before you change your dressing. If soap and water are not available, use hand  sanitizer. ? Change your dressing as told by your health care provider. ? Leave stitches (sutures), skin glue, or adhesive strips in place. These skin closures may need to stay in place for 2 weeks or longer. If adhesive strip edges start to loosen and curl up, you may trim the loose edges. Do not remove adhesive strips completely unless your health care provider tells you to do that.  Check your incision area every day for signs of infection. Check for: ? More redness, swelling, or pain. ? More fluid or blood. ? Warmth. ? Pus or a bad smell. Managing pain, stiffness, and swelling   If directed, put ice on the affected area to prevent pain and swelling. ? Put ice in a plastic bag. ? Place a towel between your skin and the bag. ? Leave the ice on for 20 minutes, 2-3 times a day.  Move your toes often to avoid stiffness and to lessen swelling.  Raise (elevate) your leg above the level of your heart while you are sitting or lying down. To do this, try putting a few pillows under your leg. Activity   Return to your normal activities as told by your health care provider. Ask your health care provider what activities are safe for you.  Do exercises as told by your health care provider or physical therapist. This will help make your hip stronger and help you recover more quickly.  Do not use your injured limb to support (bear) your body weight until your health care provider says that  you can. Follow weight-bearing restrictions as told. Use crutches or other devices to help you move around (assistive devices) as directed.  You may feel most comfortable using a raised surface when sitting on the toilet or in a chair.  Consider using a toilet seat riser over the toilet for comfort. Driving  Do not drive or use heavy machinery while taking prescription pain medicine.  Ask your health care provider when it is safe for you to drive. General instructions  Wear compression stockings as told  by your health care provider. These stockings help to prevent blood clots and reduce swelling in your legs.  Do not use any products that contain nicotine or tobacco, such as cigarettes and e-cigarettes. These can delay bone healing. If you need help quitting, ask your health care provider.  Keep all follow-up visits as told by your health care provider. This is important. This may include visits for: ? Physical therapy. ? Screening for osteoporosis. Osteoporosis is thinning and loss of density in your bones. Contact a health care provider if you:  Have a fever.  Have pain that is not helped with medicine.  Have more redness, swelling, or pain at your incision area.  Have more fluid or blood coming from your incision or leaking through your dressing.  Notice that your incision feels warm to the touch.  Have pus or a bad smell coming from your incision area. Get help right away if you:  Notice that the edges of your incision have come apart after the sutures or staples have been removed.  Have pain, warmth, or tenderness in the back of your lower leg (calf).  Have tingling or numbness in your leg.  Have a pale and cold leg.  Have trouble breathing.  Have chest pain. Summary  After the procedure, it is common to have some pain and swelling.  Take pain medicines as directed by your health care provider. Icing may also help with pain control.  Contact your health care provider if you have signs of infection, severe pain, or more fluid or blood coming from your incision. This information is not intended to replace advice given to you by your health care provider. Make sure you discuss any questions you have with your health care provider. Document Released: 09/29/2013 Document Revised: 11/22/2017 Document Reviewed: 04/14/2017 Elsevier Interactive Patient Education  2019 [employer].

## 2018-05-18 NOTE — Progress Notes (Signed)
Physical Therapy Treatment Patient Details Name: Christine [name] MRN: [id number] DOB: Feb 04, 1921 Today's Date: 05/18/2018    History of Present Illness 85 yo female with onset of fall resulted in L hip intertrochanteric fracture and ORIF.  Pt is in pain but WBAT, was referred to PT for monitoring of her mobility regarding request to go home.  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 Agreeable to PT; pt remains lethargic throughout requiring reawakening often. Pt encouraged to work with PT for exercises (slow/gentle) to improve pain and stiffness in operative leg. Pt demonstrated active assisted with mildly progressive range, but overall limited with occasional verbal outbursts to pain. Pt encouraged to work in a tolerable range with slow progression of range and was able to continue. Great grandson educated on exercises and providing assist as well; encouraged performance 4-5 times a day. Written HEP given. Skilled nursing facility recommendation remains, as pt requires total care at this time. Pt plans to discharge to skilled nursing facility today to continue rehab efforts.   Follow Up Recommendations  SNF     Equipment Recommendations  Other (comment)(pt has equipment per great grandson)    Recommendations for Other Services       Precautions / Restrictions Precautions Precautions: Fall Restrictions Weight Bearing Restrictions: Yes LLE Weight Bearing: Weight bearing as tolerated    Mobility  Bed Mobility               General bed mobility comments: Not tested; pt very lethargic, but also refuses   Transfers                    Ambulation/Gait                 Stairs             Wheelchair Mobility    Modified Rankin (Stroke Patients Only)       Balance                                             Cognition Arousal/Alertness: Lethargic Behavior During Therapy: WFL for tasks assessed/performed Overall Cognitive Status: Within Functional Limits for tasks assessed                                        Exercises General Exercises - Lower Extremity Ankle Circles/Pumps: AROM;AAROM;Both;20 reps;Supine;Other (comment)(AAROM on L with decreaesed range) Quad Sets: Strengthening;Both;15 reps;Other (comment)(intermittent on L with poor QS overall) Gluteal Sets: Other (comment)(attempted; does not follow instruction ) Short Arc Quad: AAROM;Left;20 reps;Other (comment)(no eccentric contol; AROM R) Long Arc Quad: Other (comment)(verbally educated great grandson) Heel Slides: AAROM;Left;20 reps;Other (comment)(mild improve with reps; overall limited range; AROM R) Hip ABduction/ADduction: AAROM;Both;20 reps;Supine(Assist only to hold R up) Straight Leg Raises: PROM;AAROM;Left;10 reps(AAROM R) Other Exercises Other Exercises: Education provided to great grandson with HEP provided    General Comments        Pertinent Vitals/Pain Pain Assessment: Faces Faces Pain Scale: Hurts whole lot(with movement) Pain Location: L hip to knee Pain Descriptors / Indicators: Moaning;Grimacing;Other (Comment)(yells out at times with movement) Pain Intervention(s): Limited activity within patient's tolerance;Monitored during session;Premedicated before  session    Home Living                      Prior Function            PT Goals (current goals can now be found in the care plan section) Progress towards PT goals: Progressing toward goals(slowly)    Frequency    BID      PT Plan Current plan remains appropriate    Co-evaluation              AM-PAC PT "6 Clicks" Mobility   Outcome Measure  Help needed turning from your back to your side while in a flat bed without using bedrails?: A Lot Help needed moving from lying on your back to sitting on the side  of a flat bed without using bedrails?: Total Help needed moving to and from a bed to a chair (including a wheelchair)?: Total Help needed standing up from a chair using your arms (e.g., wheelchair or bedside chair)?: Total Help needed to walk in hospital room?: Total Help needed climbing 3-5 steps with a railing? : Total 6 Click Score: 7    End of Session   Activity Tolerance: Patient limited by lethargy;Patient limited by pain Patient left: in bed;with call bell/phone within reach;with bed alarm set;with family/visitor present   PT Visit Diagnosis: Muscle weakness (generalized) (M62.81);Unsteadiness on feet (R26.81);Difficulty in walking, not elsewhere classified (R26.2);Pain Pain - Right/Left: Left Pain - part of body: Hip     Time: 2924-4628 PT Time Calculation (min) (ACUTE ONLY): 32 min  Charges:  $Therapeutic Exercise: 23-37 mins                      Larae Grooms, PTA 05/18/2018, 11:08 AM

## 2018-05-18 NOTE — Progress Notes (Signed)
Subjective: 3 Days Post-Op Procedure(s) (LRB): INTRAMEDULLARY (IM) NAIL INTERTROCHANTRIC - LEFT FEMUR (Left) Patient reports pain as moderate.   Patient is well, and has had no acute complaints or problems PT currently recommending SNF. Negative for chest pain and shortness of breath.  No dizziness this AM. Fever: no Gastrointestinal:Negative for nausea and vomiting Patient has had a bowel movement.  Objective: Vital signs in last 24 hours: Temp:  [98 F (36.7 C)-99 F (37.2 C)] 98.9 F (37.2 C) (03/01 2312) Pulse Rate:  [81-103] 103 (03/01 2312) Resp:  [18-20] 20 (03/01 2312) BP: (141-154)/(54-62) 154/55 (03/01 2312) SpO2:  [93 %-100 %] 93 % (03/01 2312)  Intake/Output from previous day:  Intake/Output Summary (Last 24 hours) at 05/18/2018 0758 Last data filed at 05/18/2018 0540 Gross per 24 hour  Intake 350 ml  Output 425 ml  Net -75 ml    Intake/Output this shift: No intake/output data recorded.  Labs: Recent Labs    05/16/18 0658 05/17/18 0421 05/17/18 1644  HGB 8.3* 7.3* 9.1*   Recent Labs    05/17/18 0421 05/17/18 1644  WBC 11.3* 13.1*  RBC 2.23* 2.82*  HCT 22.7* 27.2*  PLT 146* 150   Recent Labs    05/17/18 0421 05/18/18 0329  NA 135 134*  K 4.1 4.2  CL 101 100  CO2 28 28  BUN 18 18  CREATININE 0.86 0.75  GLUCOSE 122* 117*  CALCIUM 7.6* 7.7*   Recent Labs    05/17/18 1644  INR 3.0*     EXAM General - Patient is Alert, Appropriate and Oriented. Extremity - ABD soft Sensation intact distally Intact pulses distally Dorsiflexion/Plantar flexion intact Incision: scant drainage No cellulitis present Dressing/Incision -old blood tinged drainage Motor Function - intact, moving foot and toes well on exam.   Past Medical History:  Diagnosis Date  . Cancer ([place])    colon  . Carpal tunnel syndrome   . Complication of anesthesia    hard time waking up after thyroidectomy  . DJD (degenerative joint disease)   . Family history of adverse  reaction to anesthesia    daughter PONV  . Hearing loss   . Hypothyroidism   . Osteoporosis   . Wears glasses     Assessment/Plan: 3 Days Post-Op Procedure(s) (LRB): INTRAMEDULLARY (IM) NAIL INTERTROCHANTRIC - LEFT FEMUR (Left) Active Problems:   Hip fracture ([facility])  Estimated body mass index is 20.37 kg/m as calculated from the following:   Height as of this encounter: 5\' 7"  (1.702 m).   Weight as of this encounter: 59 kg. Advance diet Up with therapy  Labs reviewed this AM, Hg 9.1 this AM following transfusion yesterday. Up with therapy today. PT recommending SNF however family would like to take patient home.  Did discuss with her grandson today the slow progress with PT and we feel she would be better served discharging to a rehab facility. Grandson reports that she has had a bowel movement.  Upon discharge, follow-up with [facility] in 2 weeks for staple removal.  DVT Prophylaxis - Xarelto, Foot Pumps and TED hose Weight-Bearing as tolerated to left leg  J. Cameron Proud, PA-C [facility] Orthopaedic Surgery 05/18/2018, 7:58 AM

## 2018-05-18 NOTE — Care Management (Signed)
Completed the [facility] form, printed and placed on the chart for EMS transport

## 2018-05-19 ENCOUNTER — Encounter: Payer: Self-pay | Admitting: Surgery

## 2018-05-19 DIAGNOSIS — S72002A Fracture of unspecified part of neck of left femur, initial encounter for closed fracture: Secondary | ICD-10-CM | POA: Diagnosis not present

## 2018-05-19 NOTE — Discharge Summary (Signed)
Christine [name] at [facility] NAME: Christine [name]    MR#:  [id number]  DATE OF BIRTH:  [birth year]  DATE OF ADMISSION:  05/14/2018   ADMITTING PHYSICIAN: Fritzi Mandes, MD  DATE OF DISCHARGE: 05/18/2018  3:00 PM  PRIMARY CARE PHYSICIAN: Glendon Axe, MD   ADMISSION DIAGNOSIS:  Leg pain [M79.606] Closed fracture of left hip, initial encounter ([place]) [S72.002A] DISCHARGE DIAGNOSIS:  Active Problems:   Hip fracture ([place])  SECONDARY DIAGNOSIS:   Past Medical History:  Diagnosis Date  . Cancer ([place])    colon  . Carpal tunnel syndrome   . Complication of anesthesia    hard time waking up after thyroidectomy  . DJD (degenerative joint disease)   . Family history of adverse reaction to anesthesia    daughter PONV  . Hearing loss   . Hypothyroidism   . Osteoporosis   . Wears glasses    HOSPITAL COURSE:  Christine [name] a89 y.o.femalewith a known history of impaired hearing, colon cancer, left leg DVT diagnosed in August 2019 on Xarelto, hypothyroidism admitted s/p mechanical fall at home. Patient lives with granddaughter.   1.Left femoral intertrochanteric fracture -s/p surgery and recovering well - Weight-Bearing as tolerated to left leg  2.History of left posterior tibial vein DVT in August 2019 - continue Xarelto  3.Hypothyroidism continue Synthroid  4.Anemia of chronic dz: Hb 9.1 s/p 1 PRBC transfusion. DISCHARGE CONDITIONS:  stable CONSULTS OBTAINED:  Treatment Team:  Poggi, Marshall Cork, MD Otila Back, MD DRUG ALLERGIES:   Allergies  Allergen Reactions  . Adhesive [Tape] Other (See Comments)    Red itchy  . Tapentadol Other (See Comments)    Red itchy   DISCHARGE MEDICATIONS:   Allergies as of 05/18/2018      Reactions   Adhesive [tape] Other (See Comments)   Red itchy   Tapentadol Other (See Comments)   Red itchy      Medication List    TAKE these medications   calcium carbonate 1500 (600 Ca) MG Tabs  tablet Commonly known as:  OSCAL Take 600 mg by mouth 2 (two) times daily with a meal.   cholecalciferol 25 MCG (1000 UT) tablet Commonly known as:  VITAMIN D Take 1,000 Units by mouth every other day.   KP VITAMIN D3 50 MCG (2000 UT) Caps Generic drug:  Cholecalciferol Take 2,000 Units by mouth daily.   docusate sodium 100 MG capsule Commonly known as:  COLACE Take 1 capsule (100 mg total) by mouth 2 (two) times daily.   furosemide 20 MG tablet Commonly known as:  LASIX Take 20 mg by mouth as needed.   hydrochlorothiazide 12.5 MG tablet Commonly known as:  HYDRODIURIL Take 12.5 mg by mouth daily as needed. For fluid retention.   levothyroxine 100 MCG tablet Commonly known as:  SYNTHROID, LEVOTHROID Take 100 mcg by mouth daily before breakfast.   LUBRICANT EYE DROPS 0.4-0.3 % Soln Generic drug:  Polyethyl Glycol-Propyl Glycol Place 1 drop into both eyes 3 (three) times daily as needed. For dry eyes.   Melatonin 10 MG Tabs Take 10 mg by mouth at bedtime.   oxyCODONE 5 MG immediate release tablet Commonly known as:  Oxy IR/ROXICODONE Take 1 tablet (5 mg total) by mouth every 12 (twelve) hours as needed for up to 3 days for moderate pain (pain score 4-6).   polyethylene glycol packet Commonly known as:  MIRALAX / GLYCOLAX Take 17 g by mouth daily as needed for mild constipation.  potassium chloride 10 MEQ tablet Commonly known as:  K-DUR Take 10 mEq by mouth as needed.   raloxifene 60 MG tablet Commonly known as:  EVISTA Take 60 mg by mouth daily.   rivaroxaban 20 MG Tabs tablet Commonly known as:  XARELTO Take 20 mg by mouth daily.   traMADol 50 MG tablet Commonly known as:  ULTRAM Take 1 tablet (50 mg total) by mouth every 6 (six) hours as needed for up to 7 days for moderate pain.        DISCHARGE INSTRUCTIONS:  Weight-Bearing as tolerated to left leg DIET:  Regular diet DISCHARGE CONDITION:  Good ACTIVITY:  Activity as tolerated - Weight-Bearing  as tolerated to left leg OXYGEN:  Home Oxygen: No.  Oxygen Delivery: room air DISCHARGE LOCATION:  home with home health PT, RN, 2 week protocol  If you experience worsening of your admission symptoms, develop shortness of breath, life threatening emergency, suicidal or homicidal thoughts you must seek medical attention immediately by calling 911 or calling your MD immediately  if symptoms less severe.  You Must read complete instructions/literature along with all the possible adverse reactions/side effects for all the Medicines you take and that have been prescribed to you. Take any new Medicines after you have completely understood and accpet all the possible adverse reactions/side effects.   Please note  You were cared for by a hospitalist during your hospital stay. If you have any questions about your discharge medications or the care you received while you were in the hospital after you are discharged, you can call the unit and asked to speak with the hospitalist on call if the hospitalist that took care of you is not available. Once you are discharged, your primary care physician will handle any further medical issues. Please note that NO REFILLS for any discharge medications will be authorized once you are discharged, as it is imperative that you return to your primary care physician (or establish a relationship with a primary care physician if you do not have one) for your aftercare needs so that they can reassess your need for medications and monitor your lab values.    On the day of Discharge:  VITAL SIGNS:  Blood pressure (!) 135/99, pulse (!) 106, temperature 99.1 F (37.3 C), temperature source Oral, resp. rate 18, height 5\' 7"  (1.702 m), weight 59 kg, SpO2 93 %. PHYSICAL EXAMINATION:  GENERAL:  85 y.o.-year-old patient lying in the bed with no acute distress.  EYES: Pupils equal, round, reactive to light and accommodation. No scleral icterus. Extraocular muscles intact.  HEENT:  Head atraumatic, normocephalic. Oropharynx and nasopharynx clear.  NECK:  Supple, no jugular venous distention. No thyroid enlargement, no tenderness.  LUNGS: Normal breath sounds bilaterally, no wheezing, rales,rhonchi or crepitation. No use of accessory muscles of respiration.  CARDIOVASCULAR: S1, S2 normal. No murmurs, rubs, or gallops.  ABDOMEN: Soft, non-tender, non-distended. Bowel sounds present. No organomegaly or mass.  EXTREMITIES: No pedal edema, cyanosis, or clubbing.  NEUROLOGIC: Cranial nerves II through XII are intact. Muscle strength 5/5 in all extremities. Sensation intact. Gait not checked.  PSYCHIATRIC: The patient is alert and oriented x 3.  SKIN: No obvious rash, lesion, or ulcer.  DATA REVIEW:   CBC Recent Labs  Lab 05/17/18 1644  WBC 13.1*  HGB 9.1*  HCT 27.2*  PLT 150    Chemistries  Recent Labs  Lab 05/16/18 0658  05/18/18 0329  NA 134*   < > 134*  K 4.4   < >  4.2  CL 101   < > 100  CO2 26   < > 28  GLUCOSE 120*   < > 117*  BUN 20   < > 18  CREATININE 0.88   < > 0.75  CALCIUM 7.4*   < > 7.7*  MG 1.9  --   --    < > = values in this interval not displayed.     Follow-up Information    Glendon Axe, MD. Schedule an appointment as soon as possible for a visit on 05/20/2018.   Specialty:  Internal Medicine Why:  @ 3:45 pm Contact information: [place] [address] [phone]        Watt Climes, PA. Schedule an appointment as soon as possible for a visit on 05/25/2018.   Specialty:  Physician Assistant Why:  @ 10:30 am Contact information: [place] [address] [phone]            Management plans discussed with the patient, family and they are in agreement.  CODE STATUS: Prior   TOTAL TIME TAKING CARE OF THIS PATIENT: 45 minutes.    Max Sane M.D on 05/19/2018 at 11:18 PM  Between 7am to 6pm - Pager - [phone]  After 6pm go to [email or web address] - Microbiologist  [facility]  Office  [phone]  CC: Primary care physician; Glendon Axe, MD   Note: This dictation was prepared with Dragon dictation along with smaller phrase technology. Any transcriptional errors that result from this process are unintentional.

## 2018-05-20 ENCOUNTER — Inpatient Hospital Stay: Payer: Medicare HMO

## 2018-05-20 ENCOUNTER — Inpatient Hospital Stay
Admission: EM | Admit: 2018-05-20 | Discharge: 2018-05-26 | DRG: 065 | Disposition: A | Discharge status: Home Health | Payer: Medicare HMO | Attending: Internal Medicine | Admitting: Internal Medicine

## 2018-05-20 ENCOUNTER — Emergency Department: Payer: Medicare HMO

## 2018-05-20 ENCOUNTER — Inpatient Hospital Stay (HOSPITAL_COMMUNITY)
Admit: 2018-05-20 | Discharge: 2018-05-20 | Disposition: A | Discharge status: Home / Self Care | Payer: Medicare HMO | Attending: Internal Medicine | Admitting: Internal Medicine

## 2018-05-20 ENCOUNTER — Other Ambulatory Visit: Payer: Self-pay

## 2018-05-20 DIAGNOSIS — R296 Repeated falls: Secondary | ICD-10-CM | POA: Diagnosis present

## 2018-05-20 DIAGNOSIS — Z9049 Acquired absence of other specified parts of digestive tract: Secondary | ICD-10-CM

## 2018-05-20 DIAGNOSIS — I361 Nonrheumatic tricuspid (valve) insufficiency: Secondary | ICD-10-CM | POA: Diagnosis not present

## 2018-05-20 DIAGNOSIS — Z66 Do not resuscitate: Secondary | ICD-10-CM | POA: Diagnosis present

## 2018-05-20 DIAGNOSIS — S72142D Displaced intertrochanteric fracture of left femur, subsequent encounter for closed fracture with routine healing: Secondary | ICD-10-CM | POA: Diagnosis not present

## 2018-05-20 DIAGNOSIS — W1830XD Fall on same level, unspecified, subsequent encounter: Secondary | ICD-10-CM

## 2018-05-20 DIAGNOSIS — N3090 Cystitis, unspecified without hematuria: Secondary | ICD-10-CM | POA: Diagnosis present

## 2018-05-20 DIAGNOSIS — R410 Disorientation, unspecified: Secondary | ICD-10-CM | POA: Diagnosis present

## 2018-05-20 DIAGNOSIS — S0990XA Unspecified injury of head, initial encounter: Secondary | ICD-10-CM | POA: Diagnosis not present

## 2018-05-20 DIAGNOSIS — R4182 Altered mental status, unspecified: Secondary | ICD-10-CM | POA: Diagnosis not present

## 2018-05-20 DIAGNOSIS — H9190 Unspecified hearing loss, unspecified ear: Secondary | ICD-10-CM | POA: Diagnosis not present

## 2018-05-20 DIAGNOSIS — K8020 Calculus of gallbladder without cholecystitis without obstruction: Secondary | ICD-10-CM | POA: Diagnosis not present

## 2018-05-20 DIAGNOSIS — W19XXXA Unspecified fall, initial encounter: Secondary | ICD-10-CM | POA: Diagnosis not present

## 2018-05-20 DIAGNOSIS — Z90710 Acquired absence of both cervix and uterus: Secondary | ICD-10-CM | POA: Diagnosis not present

## 2018-05-20 DIAGNOSIS — N3289 Other specified disorders of bladder: Secondary | ICD-10-CM | POA: Diagnosis not present

## 2018-05-20 DIAGNOSIS — I6381 Other cerebral infarction due to occlusion or stenosis of small artery: Principal | ICD-10-CM | POA: Diagnosis present

## 2018-05-20 DIAGNOSIS — E890 Postprocedural hypothyroidism: Secondary | ICD-10-CM | POA: Diagnosis not present

## 2018-05-20 DIAGNOSIS — N281 Cyst of kidney, acquired: Secondary | ICD-10-CM | POA: Diagnosis not present

## 2018-05-20 DIAGNOSIS — I351 Nonrheumatic aortic (valve) insufficiency: Secondary | ICD-10-CM | POA: Diagnosis not present

## 2018-05-20 DIAGNOSIS — Z7901 Long term (current) use of anticoagulants: Secondary | ICD-10-CM

## 2018-05-20 DIAGNOSIS — Z7189 Other specified counseling: Secondary | ICD-10-CM | POA: Diagnosis not present

## 2018-05-20 DIAGNOSIS — Z86718 Personal history of other venous thrombosis and embolism: Secondary | ICD-10-CM

## 2018-05-20 DIAGNOSIS — Z8249 Family history of ischemic heart disease and other diseases of the circulatory system: Secondary | ICD-10-CM

## 2018-05-20 DIAGNOSIS — Z79899 Other long term (current) drug therapy: Secondary | ICD-10-CM

## 2018-05-20 DIAGNOSIS — Z515 Encounter for palliative care: Secondary | ICD-10-CM

## 2018-05-20 DIAGNOSIS — I10 Essential (primary) hypertension: Secondary | ICD-10-CM | POA: Diagnosis not present

## 2018-05-20 DIAGNOSIS — R471 Dysarthria and anarthria: Secondary | ICD-10-CM | POA: Diagnosis present

## 2018-05-20 DIAGNOSIS — G8191 Hemiplegia, unspecified affecting right dominant side: Secondary | ICD-10-CM | POA: Diagnosis present

## 2018-05-20 DIAGNOSIS — R030 Elevated blood-pressure reading, without diagnosis of hypertension: Secondary | ICD-10-CM | POA: Diagnosis not present

## 2018-05-20 DIAGNOSIS — Z888 Allergy status to other drugs, medicaments and biological substances status: Secondary | ICD-10-CM

## 2018-05-20 DIAGNOSIS — Z85038 Personal history of other malignant neoplasm of large intestine: Secondary | ICD-10-CM | POA: Diagnosis not present

## 2018-05-20 DIAGNOSIS — M2550 Pain in unspecified joint: Secondary | ICD-10-CM | POA: Diagnosis not present

## 2018-05-20 DIAGNOSIS — Z90722 Acquired absence of ovaries, bilateral: Secondary | ICD-10-CM

## 2018-05-20 DIAGNOSIS — Z8781 Personal history of (healed) traumatic fracture: Secondary | ICD-10-CM

## 2018-05-20 DIAGNOSIS — M810 Age-related osteoporosis without current pathological fracture: Secondary | ICD-10-CM | POA: Diagnosis present

## 2018-05-20 DIAGNOSIS — Z91048 Other nonmedicinal substance allergy status: Secondary | ICD-10-CM

## 2018-05-20 DIAGNOSIS — R29703 NIHSS score 3: Secondary | ICD-10-CM | POA: Diagnosis not present

## 2018-05-20 DIAGNOSIS — Z923 Personal history of irradiation: Secondary | ICD-10-CM

## 2018-05-20 DIAGNOSIS — R52 Pain, unspecified: Secondary | ICD-10-CM | POA: Diagnosis not present

## 2018-05-20 DIAGNOSIS — I639 Cerebral infarction, unspecified: Secondary | ICD-10-CM | POA: Diagnosis not present

## 2018-05-20 DIAGNOSIS — Z79891 Long term (current) use of opiate analgesic: Secondary | ICD-10-CM | POA: Diagnosis not present

## 2018-05-20 DIAGNOSIS — Z79890 Hormone replacement therapy (postmenopausal): Secondary | ICD-10-CM | POA: Diagnosis not present

## 2018-05-20 DIAGNOSIS — Z7401 Bed confinement status: Secondary | ICD-10-CM | POA: Diagnosis not present

## 2018-05-20 DIAGNOSIS — I6523 Occlusion and stenosis of bilateral carotid arteries: Secondary | ICD-10-CM | POA: Diagnosis not present

## 2018-05-20 DIAGNOSIS — T8189XA Other complications of procedures, not elsewhere classified, initial encounter: Secondary | ICD-10-CM | POA: Diagnosis not present

## 2018-05-20 DIAGNOSIS — Z833 Family history of diabetes mellitus: Secondary | ICD-10-CM

## 2018-05-20 DIAGNOSIS — S72009A Fracture of unspecified part of neck of unspecified femur, initial encounter for closed fracture: Secondary | ICD-10-CM | POA: Diagnosis not present

## 2018-05-20 LAB — BASIC METABOLIC PANEL
Anion gap: 10 (ref 5–15)
BUN: 20 mg/dL (ref 8–23)
CO2: 25 mmol/L (ref 22–32)
Calcium: 7.8 mg/dL — ABNORMAL LOW (ref 8.9–10.3)
Chloride: 101 mmol/L (ref 98–111)
Creatinine, Ser: 0.68 mg/dL (ref 0.44–1.00)
GFR calc non Af Amer: >60 mL/min (ref >60)
Glucose, Bld: 116 mg/dL — ABNORMAL HIGH (ref 70–99)
Potassium: 4.3 mmol/L (ref 3.5–5.1)
Sodium: 136 mmol/L (ref 135–145)

## 2018-05-20 LAB — PTT FACTOR INHIBITOR (MIXING STUDY)
APTT 11 NP INCUB. MIX CTL: 39.3 s — AB (ref 22.9–30.2)
aPTT 1:1 NP Mix, 60 Min,Incub.: 40.1 s — ABNORMAL HIGH (ref 22.9–30.2)
aPTT 1:1 Normal Plasma: 32.2 s — ABNORMAL HIGH (ref 22.9–30.2)
aPTT: 37 s — ABNORMAL HIGH (ref 22.9–30.2)

## 2018-05-20 LAB — URINALYSIS, COMPLETE (UACMP) WITH MICROSCOPIC
BACTERIA UA: NONE SEEN
Bilirubin Urine: NEGATIVE
Glucose, UA: NEGATIVE mg/dL
Hgb urine dipstick: NEGATIVE
Ketones, ur: NEGATIVE mg/dL
Leukocytes,Ua: NEGATIVE
Nitrite: NEGATIVE
Protein, ur: NEGATIVE mg/dL
Specific Gravity, Urine: 1.015 (ref 1.005–1.030)
Squamous Epithelial / HPF: NONE SEEN (ref 0–5)
pH: 7 (ref 5.0–8.0)

## 2018-05-20 LAB — LIPID PANEL
Cholesterol: 139 mg/dL (ref 0–200)
HDL: 46 mg/dL (ref >40)
LDL Cholesterol: 79 mg/dL (ref 0–99)
TRIGLYCERIDES: 72 mg/dL (ref <150)
Total CHOL/HDL Ratio: 3 RATIO
VLDL: 14 mg/dL (ref 0–40)

## 2018-05-20 LAB — CBC WITH DIFFERENTIAL/PLATELET
Abs Immature Granulocytes: 0.08 10*3/uL — ABNORMAL HIGH (ref 0.00–0.07)
Basophils Absolute: 0.1 10*3/uL (ref 0.0–0.1)
Basophils Relative: 1 %
Eosinophils Absolute: 0.2 10*3/uL (ref 0.0–0.5)
Eosinophils Relative: 2 %
HEMATOCRIT: 26.5 % — AB (ref 36.0–46.0)
Hemoglobin: 8.4 g/dL — ABNORMAL LOW (ref 12.0–15.0)
Immature Granulocytes: 1 %
Lymphocytes Relative: 12 %
Lymphs Abs: 1.4 10*3/uL (ref 0.7–4.0)
MCH: 32.2 pg (ref 26.0–34.0)
MCHC: 31.7 g/dL (ref 30.0–36.0)
MCV: 101.5 fL — ABNORMAL HIGH (ref 80.0–100.0)
Monocytes Absolute: 0.9 10*3/uL (ref 0.1–1.0)
Monocytes Relative: 8 %
Neutro Abs: 8.6 10*3/uL — ABNORMAL HIGH (ref 1.7–7.7)
Neutrophils Relative %: 76 %
Platelets: 192 10*3/uL (ref 150–400)
RBC: 2.61 MIL/uL — ABNORMAL LOW (ref 3.87–5.11)
RDW: 14.8 % (ref 11.5–15.5)
WBC: 11.1 10*3/uL — ABNORMAL HIGH (ref 4.0–10.5)
nRBC: 0.2 % (ref 0.0–0.2)

## 2018-05-20 LAB — HEMOGLOBIN A1C
Hgb A1c MFr Bld: 5.3 % (ref 4.8–5.6)
Mean Plasma Glucose: 105.41 mg/dL

## 2018-05-20 MED ORDER — ASPIRIN 300 MG RE SUPP: 300.0000 mg | Freq: Every day | RECTAL | Status: DC

## 2018-05-20 MED ORDER — PANTOPRAZOLE SODIUM 40 MG PO TBEC
40.0000 mg | DELAYED_RELEASE_TABLET | Freq: Two times a day (BID) | ORAL | Status: DC
  Administered 2018-05-21 – 2018-05-26 (×11): 40 mg via ORAL
  Filled 2018-05-20 (×11): qty 1

## 2018-05-20 MED ORDER — SODIUM CHLORIDE 0.9 % IV SOLN
INTRAVENOUS | Status: DC
  Administered 2018-05-20 – 2018-05-26 (×6): via INTRAVENOUS

## 2018-05-20 MED ORDER — STROKE: EARLY STAGES OF RECOVERY BOOK
Freq: Once | Status: AC
  Administered 2018-05-20: 23:00:00

## 2018-05-20 MED ORDER — LEVOTHYROXINE SODIUM 112 MCG PO TABS
112.0000 ug | ORAL_TABLET | Freq: Every day | ORAL | Status: DC
  Administered 2018-05-21 – 2018-05-26 (×6): 112 ug via ORAL
  Filled 2018-05-20 (×7): qty 1

## 2018-05-20 MED ORDER — ENOXAPARIN SODIUM 40 MG/0.4ML ~~LOC~~ SOLN
40.0000 mg | SUBCUTANEOUS | Status: DC
  Administered 2018-05-20 – 2018-05-21 (×2): 40 mg via SUBCUTANEOUS
  Filled 2018-05-20 (×2): qty 0.4

## 2018-05-20 MED ORDER — MELATONIN 5 MG PO TABS
10.0000 mg | ORAL_TABLET | Freq: Every day | ORAL | Status: DC
  Administered 2018-05-21 – 2018-05-25 (×5): 10 mg via ORAL
  Filled 2018-05-20 (×7): qty 2

## 2018-05-20 MED ORDER — TRAMADOL HCL 50 MG PO TABS
50.0000 mg | ORAL_TABLET | Freq: Four times a day (QID) | ORAL | Status: DC | PRN
  Administered 2018-05-21 – 2018-05-25 (×9): 50 mg via ORAL
  Filled 2018-05-20 (×10): qty 1

## 2018-05-20 MED ORDER — DOCUSATE SODIUM 100 MG PO CAPS
100.0000 mg | ORAL_CAPSULE | Freq: Two times a day (BID) | ORAL | Status: DC
  Administered 2018-05-21 – 2018-05-26 (×11): 100 mg via ORAL
  Filled 2018-05-20 (×11): qty 1

## 2018-05-20 MED ORDER — ACETAMINOPHEN 650 MG RE SUPP: 650.0000 mg | RECTAL | Status: DC | PRN

## 2018-05-20 MED ORDER — MORPHINE SULFATE (PF) 2 MG/ML IV SOLN
2.0000 mg | INTRAVENOUS | Status: DC | PRN
  Administered 2018-05-20 – 2018-05-21 (×2): 2 mg via INTRAVENOUS
  Filled 2018-05-20 (×2): qty 1

## 2018-05-20 MED ORDER — ASPIRIN 325 MG PO TABS
325.0000 mg | ORAL_TABLET | Freq: Every day | ORAL | Status: DC
  Administered 2018-05-21: 10:00:00 325 mg via ORAL
  Filled 2018-05-20 (×2): qty 1

## 2018-05-20 MED ORDER — ACETAMINOPHEN 160 MG/5ML PO SOLN
650.0000 mg | ORAL | Status: DC | PRN
  Filled 2018-05-20: qty 20.3

## 2018-05-20 MED ORDER — HYDRALAZINE HCL 20 MG/ML IJ SOLN: 10.0000 mg | Freq: Four times a day (QID) | INTRAMUSCULAR | Status: DC | PRN

## 2018-05-20 MED ORDER — ACETAMINOPHEN 325 MG PO TABS: 650.0000 mg | ORAL_TABLET | ORAL | Status: DC | PRN

## 2018-05-20 NOTE — ED Notes (Signed)
ED TO INPATIENT HANDOFF REPORT  ED Nurse Name and Phone #: Skyler Rn  S Name/Age/Gender Christine [name] 85 y.o. female Room/Bed: ED08A/ED08A  Code Status   Code Status: DNR  Home/SNF/Other Home Patient oriented to: self Is this baseline? Yes   Triage Complete: Triage complete  Chief Complaint confusion  Triage Note Pt arrives [facility] from home for some confusion and strong urine smell per family. Started last night. [facility] last week and had L hip surgery. Was DC Monday. Per EMS pt is A&O. Pt is HOH.    Allergies Allergies  Allergen Reactions  . Adhesive [Tape] Other (See Comments)    Red itchy  . Tapentadol Other (See Comments)    Red itchy    Level of Care/Admitting Diagnosis ED Disposition    ED Disposition Condition [facility] Area: [place] [100120]  Level of Care: Med-Surg [16]  Diagnosis: CVA (cerebral vascular accident) [facility]) [[id number]]  Admitting Physician: [place], [place]  Attending Physician: Hillary Bow [phone]  Estimated length of stay: past midnight tomorrow  Certification:: I certify this patient will need inpatient services for at least 2 midnights  PT Class (Do Not Modify): Inpatient [101]  PT Acc Code (Do Not Modify): Private [1]       B Medical/Surgery History Past Medical History:  Diagnosis Date  . Cancer ([place])    colon  . Carpal tunnel syndrome   . Complication of anesthesia    hard time waking up after thyroidectomy  . DJD (degenerative joint disease)   . Family history of adverse reaction to anesthesia    daughter PONV  . Hearing loss   . Hypothyroidism   . Osteoporosis   . Wears glasses    Past Surgical History:  Procedure Laterality Date  . ABDOMINAL HYSTERECTOMY    . COLON RESECTION  1995   for colon cancer  . EYE SURGERY Bilateral    cataract surgery  . INTRAMEDULLARY (IM) NAIL INTERTROCHANTERIC Right 01/09/2015   Procedure: INTRAMEDULLARY (IM) NAIL INTERTROCHANTRIC;   Surgeon: Corky Mull, MD;  Location: [facility] ORS;  Service: Orthopedics;  Laterality: Right;  . INTRAMEDULLARY (IM) NAIL INTERTROCHANTERIC Left 05/15/2018   Procedure: INTRAMEDULLARY (IM) NAIL INTERTROCHANTRIC - LEFT FEMUR;  Surgeon: Corky Mull, MD;  Location: [facility] ORS;  Service: Orthopedics;  Laterality: Left;  . Lung surgery     as a child for empyema  . OOPHORECTOMY    . THYROID SURGERY  1967   for multinodular goitre  . TOTAL HIP REVISION Right 10/24/2016   Procedure: TOTAL HIP REVISION AND HARDWARE REMOVAL;  Surgeon: Corky Mull, MD;  Location: [facility] ORS;  Service: Orthopedics;  Laterality: Right;     A IV Location/Drains/Wounds Patient Lines/Drains/Airways Status   Active Line/Drains/Airways    Name:   Placement date:   Placement time:   Site:   Days:   Peripheral IV 05/20/18 Right Hand   05/20/18    1227    Hand   less than 1   Incision (Closed) 01/09/15 Hip Right   01/09/15    1712     1227   Incision (Closed) 10/24/16 Hip Right   10/24/16    1631     573   Incision (Closed) 10/24/16 Thigh Right;Lateral   10/24/16    2000     573   Incision (Closed) 05/15/18 Hip Left   05/15/18    1403     5   Pressure Injury 10/24/16 Stage I -  Intact skin with non-blanchable redness of a localized area usually over a bony prominence.   10/24/16    2020     573   Wound / Incision (Open or Dehisced) 01/09/15 Other (Comment) Knee Left red   01/09/15    1220    Knee   1227   Wound / Incision (Open or Dehisced) 01/09/15 Other (Comment) Knee Left below knee  with scab   01/09/15    1220    Knee   1227   Wound / Incision (Open or Dehisced) 01/09/15 Other (Comment) Knee Right right knee red    01/09/15    1220    Knee   1227   Wound / Incision (Open or Dehisced) 01/09/15 Other (Comment) Knee Right red   01/09/15    1220    Knee   1227   Wound / Incision (Open or Dehisced) 01/09/15 Other (Comment) Elbow Left red   01/09/15    1220    Elbow   1227   Wound / Incision (Open or Dehisced) 01/09/15 Other  (Comment) Elbow Left gray scab   01/09/15    1220    Elbow   1227          Intake/Output Last 24 hours No intake or output data in the 24 hours ending 05/20/18 1811  Labs/Imaging Results for orders placed or performed during the hospital encounter of 05/20/18 (from the past 48 hour(s))  Basic metabolic panel     Status: Abnormal   Collection Time: 05/20/18 12:33 PM  Result Value Ref Range   Sodium 136 135 - 145 mmol/L   Potassium 4.3 3.5 - 5.1 mmol/L   Chloride 101 98 - 111 mmol/L   CO2 25 22 - 32 mmol/L   Glucose, Bld 116 (H) 70 - 99 mg/dL   BUN 20 8 - 23 mg/dL   Creatinine, Ser 0.68 0.44 - 1.00 mg/dL   Calcium 7.8 (L) 8.9 - 10.3 mg/dL   GFR calc non Af Amer >60 >60 mL/min   GFR calc Af Amer >60 >60 mL/min   Anion gap 10 5 - 15    Comment: Performed at [facility], [address] [place], [place] [address]  CBC with Differential     Status: Abnormal   Collection Time: 05/20/18 12:33 PM  Result Value Ref Range   WBC 11.1 (H) 4.0 - 10.5 K/uL   RBC 2.61 (L) 3.87 - 5.11 MIL/uL   Hemoglobin 8.4 (L) 12.0 - 15.0 g/dL   HCT 26.5 (L) 36.0 - 46.0 %   MCV 101.5 (H) 80.0 - 100.0 fL   MCH 32.2 26.0 - 34.0 pg   MCHC 31.7 30.0 - 36.0 g/dL   RDW 14.8 11.5 - 15.5 %   Platelets 192 150 - 400 K/uL   nRBC 0.2 0.0 - 0.2 %   Neutrophils Relative % 76 %   Neutro Abs 8.6 (H) 1.7 - 7.7 K/uL   Lymphocytes Relative 12 %   Lymphs Abs 1.4 0.7 - 4.0 K/uL   Monocytes Relative 8 %   Monocytes Absolute 0.9 0.1 - 1.0 K/uL   Eosinophils Relative 2 %   Eosinophils Absolute 0.2 0.0 - 0.5 K/uL   Basophils Relative 1 %   Basophils Absolute 0.1 0.0 - 0.1 K/uL   Immature Granulocytes 1 %   Abs Immature Granulocytes 0.08 (H) 0.00 - 0.07 K/uL    Comment: Performed at [facility], [address] [place], [place] [address]  Lipid panel  Status: None   Collection Time: 05/20/18 12:33 PM  Result Value Ref Range   Cholesterol 139 0 - 200 mg/dL   Triglycerides 72 <150 mg/dL   HDL  46 >40 mg/dL   Total CHOL/HDL Ratio 3.0 RATIO   VLDL 14 0 - 40 mg/dL   LDL Cholesterol 79 0 - 99 mg/dL    Comment:        Total Cholesterol/HDL:CHD Risk Coronary Heart Disease Risk Table                     Men   Women  1/2 Average Risk   3.4   3.3  Average Risk       5.0   4.4  2 X Average Risk   9.6   7.1  3 X Average Risk  23.4   11.0        Use the calculated Patient Ratio above and the CHD Risk Table to determine the patient's CHD Risk.        ATP III CLASSIFICATION (LDL):  <100     mg/dL   Optimal  100-129  mg/dL   Near or Above                    Optimal  130-159  mg/dL   Borderline  160-189  mg/dL   High  >190     mg/dL   Very High Performed at [address] [place], [place] [address]   Urinalysis, Complete w Microscopic     Status: Abnormal   Collection Time: 05/20/18  1:21 PM  Result Value Ref Range   Color, Urine YELLOW (A) YELLOW   APPearance CLEAR (A) CLEAR   Specific Gravity, Urine 1.015 1.005 - 1.030   pH 7.0 5.0 - 8.0   Glucose, UA NEGATIVE NEGATIVE mg/dL   Hgb urine dipstick NEGATIVE NEGATIVE   Bilirubin Urine NEGATIVE NEGATIVE   Ketones, ur NEGATIVE NEGATIVE mg/dL   Protein, ur NEGATIVE NEGATIVE mg/dL   Nitrite NEGATIVE NEGATIVE   Leukocytes,Ua NEGATIVE NEGATIVE   RBC / HPF 0-5 0 - 5 RBC/hpf   WBC, UA 0-5 0 - 5 WBC/hpf   Bacteria, UA NONE SEEN NONE SEEN   Squamous Epithelial / LPF NONE SEEN 0 - 5    Comment: Performed at [facility], [address] [place], [place] [address]   Ct Head Wo Contrast  Result Date: 05/20/2018 CLINICAL DATA:  Altered mental status since last night. Recent fall. EXAM: CT HEAD WITHOUT CONTRAST TECHNIQUE: Contiguous axial images were obtained from the base of the skull through the vertex without intravenous contrast. COMPARISON:  MRI of the head December 26, 2011 FINDINGS: BRAIN: New LEFT thalamus 1 cm infarct. No intraparenchymal hemorrhage, mass effect nor midline shift. No parenchymal  brain volume loss for age. No hydrocephalus. Patchy supratentorial white matter hypodensities less than expected for patient's age, though non-specific are most compatible with chronic small vessel ischemic disease. No acute large vascular territory infarcts. No abnormal extra-axial fluid collections. Basal cisterns are patent. VASCULAR: Moderate to severe calcific atherosclerosis of the carotid siphons. SKULL: No skull fracture. No significant scalp soft tissue swelling. SINUSES/ORBITS: Trace paranasal sinus mucosal thickening. Mastoid air cells are well aerated.The included ocular globes and orbital contents are non-suspicious. Status post bilateral ocular lens implants. OTHER: None. IMPRESSION: 1. New age indeterminate LEFT thalamus small infarcts. 2. Otherwise similar examination given differences in imaging technique including mild chronic small vessel ischemic changes. Electronically Signed  By: Elon Alas M.D.   On: 05/20/2018 16:38    Pending Labs Unresulted Labs (From admission, onward)    Start     Ordered   05/27/18 0500  Creatinine, serum  (enoxaparin (LOVENOX)    CrCl >/= 30 ml/min)  Weekly,   STAT    Comments:  while on enoxaparin therapy    05/20/18 1650   05/20/18 1649  Hemoglobin A1c  Add-on,   AD     05/20/18 1650   05/20/18 1251  Urine culture  Once,   STAT     05/20/18 1250          Vitals/Pain Today's Vitals   05/20/18 1600 05/20/18 1630 05/20/18 1645 05/20/18 1700  BP: (!) 150/47 (!) 143/51  (!) 132/54  Pulse:  76 85   Resp:      Temp:      TempSrc:      SpO2:  95% 98%   Weight:      Height:      PainSc:        Isolation Precautions No active isolations  Medications Medications   stroke: mapping our early stages of recovery book (has no administration in time range)  0.9 %  sodium chloride infusion (has no administration in time range)  acetaminophen (TYLENOL) tablet 650 mg (has no administration in time range)    Or  acetaminophen (TYLENOL)  solution 650 mg (has no administration in time range)    Or  acetaminophen (TYLENOL) suppository 650 mg (has no administration in time range)  enoxaparin (LOVENOX) injection 40 mg (has no administration in time range)  aspirin suppository 300 mg (has no administration in time range)    Or  aspirin tablet 325 mg (has no administration in time range)  hydrALAZINE (APRESOLINE) injection 10 mg (has no administration in time range)    Mobility non-ambulatory High fall risk   Focused Assessments Cardiac Assessment Handoff:    Lab Results  Component Value Date   TROPONINI <0.03 01/09/2015   No results found for: DDIMER Does the Patient currently have chest pain? Yes     R Recommendations: See Admitting Provider Note  Report given to: Skyler RN  Additional Notes:

## 2018-05-20 NOTE — ED Notes (Signed)
Patient straight cath for urine specimen.

## 2018-05-20 NOTE — Progress Notes (Signed)
Advance care planning  Purpose of Encounter Acute CVA  Parties in Attendance Patient, daughter and granddaughter  Patients Decisional capacity Patient with acute mental status change secondary to stroke and confused.  Unable to make medical decisions.  Her healthcare power of attorney is her daughter Christine [name].   Discussed regarding her acute CVA, treatment plan, prognosis.  All questions answered.  CODE STATUS discussed.  Patient is DO NOT RESUSCITATE DO NOT INTUBATE according to the daughter.  Orders entered.  CODE STATUS changed.  DNR/DNI  Time spent - 17 minutes

## 2018-05-20 NOTE — H&P (Signed)
[facility] [place] NAME: Panhia Karl    MR#:  [id number]  DATE OF BIRTH:  1920/05/18  DATE OF ADMISSION:  05/20/2018  PRIMARY CARE PHYSICIAN: Glendon Axe, MD   REQUESTING/REFERRING PHYSICIAN: Dr. Joni Fears  CHIEF COMPLAINT:   Chief Complaint  Patient presents with  . Altered Mental Status    HISTORY OF PRESENT ILLNESS:  Sachi Boulay  is a 85 y.o. female with a known history of recent hip fracture, hypothyroidism presents to the emergency room due to acute onset of dysarthria, confusion.  Patient was last known well at 2:15 AM when she spoke with her granddaughter and was oriented and clear with her speech.  Presently has dysarthria.  CT scan of the head shows left thalamic CVA.  Urinalysis normal.  Patient was recently in the hospital for hip fracture and her granddaughter mentions she barely took 3 steps and was discharged home with home health.  Home health was supposed to come in today but patient ended up in the hospital.  No confusion at home prior to this episode.  PAST MEDICAL HISTORY:   Past Medical History:  Diagnosis Date  . Cancer ([place])    colon  . Carpal tunnel syndrome   . Complication of anesthesia    hard time waking up after thyroidectomy  . DJD (degenerative joint disease)   . Family history of adverse reaction to anesthesia    daughter PONV  . Hearing loss   . Hypothyroidism   . Osteoporosis   . Wears glasses     PAST SURGICAL HISTORY:   Past Surgical History:  Procedure Laterality Date  . ABDOMINAL HYSTERECTOMY    . COLON RESECTION  1995   for colon cancer  . EYE SURGERY Bilateral    cataract surgery  . INTRAMEDULLARY (IM) NAIL INTERTROCHANTERIC Right 01/09/2015   Procedure: INTRAMEDULLARY (IM) NAIL INTERTROCHANTRIC;  Surgeon: Corky Mull, MD;  Location: [facility] ORS;  Service: Orthopedics;  Laterality: Right;  . INTRAMEDULLARY (IM) NAIL INTERTROCHANTERIC Left 05/15/2018   Procedure: INTRAMEDULLARY (IM)  NAIL INTERTROCHANTRIC - LEFT FEMUR;  Surgeon: Corky Mull, MD;  Location: [facility] ORS;  Service: Orthopedics;  Laterality: Left;  . Lung surgery     as a child for empyema  . OOPHORECTOMY    . THYROID SURGERY  1967   for multinodular goitre  . TOTAL HIP REVISION Right 10/24/2016   Procedure: TOTAL HIP REVISION AND HARDWARE REMOVAL;  Surgeon: Corky Mull, MD;  Location: [facility] ORS;  Service: Orthopedics;  Laterality: Right;    SOCIAL HISTORY:   Social History   Tobacco Use  . Smoking status: Never Smoker  . Smokeless tobacco: Never Used  Substance Use Topics  . Alcohol use: No    FAMILY HISTORY:   Family History  Problem Relation Age of Onset  . Heart disease Father   . Diabetes Sister   . Cancer Sister   . Diabetes Brother   . Cancer Brother   . Cancer Mother     DRUG ALLERGIES:   Allergies  Allergen Reactions  . Adhesive [Tape] Other (See Comments)    Red itchy  . Tapentadol Other (See Comments)    Red itchy    REVIEW OF SYSTEMS:   Review of Systems  Unable to perform ROS: Mental status change    MEDICATIONS AT HOME:   Prior to Admission medications   Medication Sig Start Date End Date Taking? Authorizing Provider  acetaminophen (TYLENOL) 325 MG tablet Take  650 mg by mouth every 6 (six) hours as needed for pain. 01/13/15  Yes [provider]  calcium carbonate (OSCAL) 1500 (600 Ca) MG TABS tablet Take 600 mg by mouth 2 (two) times daily with a meal.   Yes [provider]  Cholecalciferol (KP VITAMIN D3) 2000 units CAPS Take 2,000 Units by mouth daily.    Yes [provider]  cholecalciferol (VITAMIN D) 1000 units tablet Take 1,000 Units by mouth every other day.   Yes [provider]  furosemide (LASIX) 20 MG tablet Take 20 mg by mouth as needed.   Yes [provider]  levothyroxine (SYNTHROID, LEVOTHROID) 112 MCG tablet Take 112 mcg by mouth daily. 10/14/17  Yes [provider]  Melatonin 10 MG TABS Take 10  mg by mouth at bedtime.   Yes [provider]  pantoprazole (PROTONIX) 40 MG tablet Take 40 mg by mouth 2 (two) times daily. 02/09/18  Yes [provider]  potassium chloride (K-DUR) 10 MEQ tablet Take 10 mEq by mouth as needed.   Yes [provider]  raloxifene (EVISTA) 60 MG tablet Take 60 mg by mouth daily.  09/21/16  Yes [provider]  traMADol (ULTRAM) 50 MG tablet Take 1 tablet (50 mg total) by mouth every 6 (six) hours as needed for up to 7 days for moderate pain. 05/18/18 05/25/18 Yes Max Sane, MD  docusate sodium (COLACE) 100 MG capsule Take 1 capsule (100 mg total) by mouth 2 (two) times daily. 05/18/18   Max Sane, MD  LUBRICANT EYE DROPS 0.4-0.3 % SOLN Place 1 drop into both eyes 3 (three) times daily as needed. For dry eyes. 07/19/16   [provider]  oxyCODONE (OXY IR/ROXICODONE) 5 MG immediate release tablet Take 1 tablet (5 mg total) by mouth every 12 (twelve) hours as needed for up to 3 days for moderate pain (pain score 4-6). 05/18/18 05/21/18  Max Sane, MD  polyethylene glycol (MIRALAX / GLYCOLAX) packet Take 17 g by mouth daily as needed for mild constipation. 05/18/18   Max Sane, MD     VITAL SIGNS:  Blood pressure (!) 151/63, pulse 73, temperature 97.7 F (36.5 C), temperature source Oral, resp. rate 18, height 5\' 7"  (1.702 m), weight 59 kg, SpO2 98 %.  PHYSICAL EXAMINATION:  Physical Exam  GENERAL:  85 y.o.-year-old patient lying in the bed with no acute distress.  EYES: Pupils equal, round, reactive to light and accommodation. No scleral icterus. Extraocular muscles intact.  HEENT: Head atraumatic, normocephalic. Oropharynx and nasopharynx clear. No oropharyngeal erythema, moist oral mucosa  NECK:  Supple, no jugular venous distention. No thyroid enlargement, no tenderness.  LUNGS: Normal breath sounds bilaterally, no wheezing, rales, rhonchi. No use of accessory muscles of respiration.  CARDIOVASCULAR: S1, S2 normal. No  murmurs, rubs, or gallops.  ABDOMEN: Soft, nontender, nondistended. Bowel sounds present. No organomegaly or mass.  EXTREMITIES: No pedal edema, cyanosis, or clubbing. + 2 pedal & radial pulses b/l.   NEUROLOGIC: Cranial nerves II through XII are intact.  Dysarthria. Not following instructions well.  Difficulty moving her left lower extremity due to pain from recent hip fracture.  Has right-sided pronator drift. PSYCHIATRIC: The patient is alert and awake.  Pleasantly confused SKIN: No obvious rash, lesion, or ulcer.   LABORATORY PANEL:   CBC Recent Labs  Lab 05/20/18 1233  WBC 11.1*  HGB 8.4*  HCT 26.5*  PLT 192   ------------------------------------------------------------------------------------------------------------------  Chemistries  Recent Labs  Lab 05/16/18 [phone]  05/20/18 1233  NA 134*   < > 136  K 4.4   < > 4.3  CL 101   < > 101  CO2 26   < > 25  GLUCOSE 120*   < > 116*  BUN 20   < > 20  CREATININE 0.88   < > 0.68  CALCIUM 7.4*   < > 7.8*  MG 1.9  --   --    < > = values in this interval not displayed.   ------------------------------------------------------------------------------------------------------------------  Cardiac Enzymes No results for input(s): TROPONINI in the last 168 hours. ------------------------------------------------------------------------------------------------------------------  RADIOLOGY:  Ct Head Wo Contrast  Result Date: 05/20/2018 CLINICAL DATA:  Altered mental status since last night. Recent fall. EXAM: CT HEAD WITHOUT CONTRAST TECHNIQUE: Contiguous axial images were obtained from the base of the skull through the vertex without intravenous contrast. COMPARISON:  MRI of the head December 26, 2011 FINDINGS: BRAIN: New LEFT thalamus 1 cm infarct. No intraparenchymal hemorrhage, mass effect nor midline shift. No parenchymal brain volume loss for age. No hydrocephalus. Patchy supratentorial white matter hypodensities less than expected  for patient's age, though non-specific are most compatible with chronic small vessel ischemic disease. No acute large vascular territory infarcts. No abnormal extra-axial fluid collections. Basal cisterns are patent. VASCULAR: Moderate to severe calcific atherosclerosis of the carotid siphons. SKULL: No skull fracture. No significant scalp soft tissue swelling. SINUSES/ORBITS: Trace paranasal sinus mucosal thickening. Mastoid air cells are well aerated.The included ocular globes and orbital contents are non-suspicious. Status post bilateral ocular lens implants. OTHER: None. IMPRESSION: 1. New age indeterminate LEFT thalamus small infarcts. 2. Otherwise similar examination given differences in imaging technique including mild chronic small vessel ischemic changes. Electronically Signed   By: Elon Alas M.D.   On: 05/20/2018 16:38     IMPRESSION AND PLAN:   *Acute left thalamic CVA with dysarthria, right-sided weakness and confusion. -Check MRI of the brain, Carotid dopplers, Echo - Start aspirin.  Start statin once patient able to take oral medications - Lovenox for DVT prophylaxis. - PT/OT/Speech consult as needed per symptoms - Neuro checks every 4 hours for 24 hours. - Consult neurology. Beyond window for TPA  *Elevated blood pressure without diagnosis of hypertension.  Likely due to acute CVA.  No history of hypertension.  Permissive hypertension  *Recent hip fracture.  Continue physical therapy.  Consulted.  *DVT prophylaxis with Lovenox   All the records are reviewed and case discussed with ED provider. Management plans discussed with the patient, family and they are in agreement.  CODE STATUS: DNR/DNI  TOTAL TIME TAKING CARE OF THIS PATIENT: 35 minutes.   Leia Alf Decarlo Rivet M.D on 05/20/2018 at 4:53 PM  Between 7am to 6pm - Pager - [phone]  After 6pm go to [email or web address] - password [email or web address] [facility]  Office  [phone]  CC: Primary care  physician; Glendon Axe, MD  Note: This dictation was prepared with Dragon dictation along with smaller phrase technology. Any transcriptional errors that result from this process are unintentional.

## 2018-05-20 NOTE — ED Triage Notes (Signed)
Pt arrives [facility] from home for some confusion and strong urine smell per family. Started last night. [facility] last week and had L hip surgery. Was DC Monday. Per EMS pt is A&O. Pt is HOH.

## 2018-05-20 NOTE — ED Notes (Signed)
Light green and lavender sent to lab at this time

## 2018-05-20 NOTE — ED Notes (Signed)
admit orders acknowledged awatiing pharmacy to verify scheduled medications.

## 2018-05-20 NOTE — ED Notes (Signed)
RACW port accessed with positive blood return. Labs drawn and sent. Md at bedsise. Patient awaiting ct head. Family at bedside.

## 2018-05-20 NOTE — ED Provider Notes (Addendum)
[facility] Emergency Department Provider Note  ____________________________________________  Time seen: Approximately 3:47 PM  I have reviewed the triage vital signs and the nursing notes.   HISTORY  Chief Complaint Altered Mental Status    Level 5 Caveat: Portions of the History and Physical including HPI and review of systems are unable to be completely obtained due to patient being a poor historian   HPI Christine [name] is a 85 y.o. female with a history of colon cancer, hypothyroidism who comes the ED with reports of confusion and possibly dysuria.  She recently was in the hospital with a hip fracture and had surgical fixation.  No recurrent falls or trauma.  Family reports they witnessed the initial fall that broke her hip and she did not have any head injury at that time.  Patient denies complaints presently.  He reports the last known well was 2:15 AM in which she was conversant and normal.  Oriented.  Then when they woke up this morning, patient is having trouble speaking and seems confused.     Past Medical History:  Diagnosis Date  . Cancer ([place])    colon  . Carpal tunnel syndrome   . Complication of anesthesia    hard time waking up after thyroidectomy  . DJD (degenerative joint disease)   . Family history of adverse reaction to anesthesia    daughter PONV  . Hearing loss   . Hypothyroidism   . Osteoporosis   . Wears glasses      Patient Active Problem List   Diagnosis Date Noted  . Pressure injury of skin 10/25/2016  . 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 Hip fracture ([place]) 01/09/2015     Past Surgical History:  Procedure Laterality Date  . ABDOMINAL HYSTERECTOMY    . COLON RESECTION  1995   for colon cancer  . EYE SURGERY Bilateral    cataract surgery  . INTRAMEDULLARY (IM) NAIL INTERTROCHANTERIC Right 01/09/2015   Procedure: INTRAMEDULLARY (IM) NAIL INTERTROCHANTRIC;  Surgeon: Corky Mull, MD;   Location: [facility] ORS;  Service: Orthopedics;  Laterality: Right;  . INTRAMEDULLARY (IM) NAIL INTERTROCHANTERIC Left 05/15/2018   Procedure: INTRAMEDULLARY (IM) NAIL INTERTROCHANTRIC - LEFT FEMUR;  Surgeon: Corky Mull, MD;  Location: [facility] ORS;  Service: Orthopedics;  Laterality: Left;  . Lung surgery     as a child for empyema  . OOPHORECTOMY    . THYROID SURGERY  1967   for multinodular goitre  . TOTAL HIP REVISION Right 10/24/2016   Procedure: TOTAL HIP REVISION AND HARDWARE REMOVAL;  Surgeon: Corky Mull, MD;  Location: [facility] ORS;  Service: Orthopedics;  Laterality: Right;     Prior to Admission medications   Medication Sig Start Date End Date Taking? Authorizing Provider  calcium carbonate (OSCAL) 1500 (600 Ca) MG TABS tablet Take 600 mg by mouth 2 (two) times daily with a meal.    [provider]  Cholecalciferol (KP VITAMIN D3) 2000 units CAPS Take 2,000 Units by mouth daily.     [provider]  cholecalciferol (VITAMIN D) 1000 units tablet Take 1,000 Units by mouth every other day.    [provider]  docusate sodium (COLACE) 100 MG capsule Take 1 capsule (100 mg total) by mouth 2 (two) times daily. 05/18/18   Max Sane, MD  furosemide (LASIX) 20 MG tablet Take 20 mg by mouth as needed.    [provider]  hydrochlorothiazide (HYDRODIURIL) 12.5 MG tablet Take 12.5  mg by mouth daily as needed. For fluid retention. 07/18/16   [provider]  levothyroxine (SYNTHROID, LEVOTHROID) 100 MCG tablet Take 100 mcg by mouth daily before breakfast.     [provider]  LUBRICANT EYE DROPS 0.4-0.3 % SOLN Place 1 drop into both eyes 3 (three) times daily as needed. For dry eyes. 07/19/16   [provider]  Melatonin 10 MG TABS Take 10 mg by mouth at bedtime.    [provider]  oxyCODONE (OXY IR/ROXICODONE) 5 MG immediate release tablet Take 1 tablet (5 mg total) by mouth every 12 (twelve) hours as needed for up to 3 days for  moderate pain (pain score 4-6). 05/18/18 05/21/18  Max Sane, MD  polyethylene glycol (MIRALAX / GLYCOLAX) packet Take 17 g by mouth daily as needed for mild constipation. 05/18/18   Max Sane, MD  potassium chloride (K-DUR) 10 MEQ tablet Take 10 mEq by mouth as needed.    [provider]  raloxifene (EVISTA) 60 MG tablet Take 60 mg by mouth daily.  09/21/16   [provider]  rivaroxaban (XARELTO) 20 MG TABS tablet Take 20 mg by mouth daily.    [provider]  traMADol (ULTRAM) 50 MG tablet Take 1 tablet (50 mg total) by mouth every 6 (six) hours as needed for up to 7 days for moderate pain. 05/18/18 05/25/18  Max Sane, MD     Allergies Adhesive [tape] and Tapentadol   Family History  Problem Relation Age of Onset  . Heart disease Father   . Diabetes Sister   . Cancer Sister   . Diabetes Brother   . Cancer Brother   . Cancer Mother     Social History Social History   Tobacco Use  . Smoking status: Never Smoker  . Smokeless tobacco: Never Used  Substance Use Topics  . Alcohol use: No  . Drug use: No    Review of Systems Level 5 Caveat: Portions of the History and Physical including HPI and review of systems are unable to be completely obtained due to patient being a poor historian   Constitutional:   No known fever.  ENT:   No rhinorrhea. Cardiovascular:   No chest pain or syncope. Respiratory:   No dyspnea or cough. Gastrointestinal:   Negative for abdominal pain, vomiting and diarrhea.  Musculoskeletal:   Negative for focal pain or swelling ____________________________________________   PHYSICAL EXAM:  VITAL SIGNS: ED Triage Vitals  Enc Vitals Group     BP 05/20/18 1228 (!) 177/60     Pulse Rate 05/20/18 1228 72     Resp 05/20/18 1228 18     Temp 05/20/18 1228 97.7 F (36.5 C)     Temp Source 05/20/18 1228 Oral     SpO2 05/20/18 1228 96 %     Weight 05/20/18 1224 130 lb 1.1 oz (59 kg)     Height 05/20/18 1224 5\' 7"  (1.702 m)      Head Circumference --      Peak Flow --      Pain Score 05/20/18 1224 0     Pain Loc --      Pain Edu? --      Excl. in [place]? --     Vital signs reviewed, nursing assessments reviewed.   Constitutional:   Alert and oriented to person and place. Non-toxic appearance. Eyes:   Conjunctivae are normal. EOMI. PERRL. ENT      Head:   Normocephalic and atraumatic.  Nose:   No congestion/rhinnorhea.       Mouth/Throat:   MMM, no pharyngeal erythema. No peritonsillar mass.       Neck:   No meningismus. Full ROM. Hematological/Lymphatic/Immunilogical:   No cervical lymphadenopathy. Cardiovascular:   RRR. Symmetric bilateral radial and DP pulses.  No murmurs. Cap refill less than 2 seconds. Respiratory:   Normal respiratory effort without tachypnea/retractions. Breath sounds are clear and equal bilaterally. No wheezes/rales/rhonchi. Gastrointestinal:   Soft with suprapubic tenderness.  Non distended. There is no CVA tenderness.  No rebound, rigidity, or guarding.  Musculoskeletal:   Pain limited range of motion of left hip.  No joint effusions.  No lower extremity tenderness.  No edema.  Surgical incisions over left hip noninflamed, sterile dressings in place, appear to be healing appropriately.  Scant amount of serous drainage from the incision. Neurologic:   Dysarthria.  Cranial nerves III through XII intact Motor grossly intact. NIH stroke scale 3 for dysarthria and disorientation. Skin:    Skin is warm, dry surgical incisions as above.  Small skin tear on the right shin which is noninflamed and hemostatic.  Has a clean dressing.  No rash noted.  No petechiae, purpura, or bullae.  ____________________________________________    LABS (pertinent positives/negatives) (all labs ordered are listed, but only abnormal results are displayed) Labs Reviewed  BASIC METABOLIC PANEL - Abnormal; Notable for the following components:      Result Value   Glucose, Bld 116 (*)    Calcium 7.8 (*)     All other components within normal limits  CBC WITH DIFFERENTIAL/PLATELET - Abnormal; Notable for the following components:   WBC 11.1 (*)    RBC 2.61 (*)    Hemoglobin 8.4 (*)    HCT 26.5 (*)    MCV 101.5 (*)    Neutro Abs 8.6 (*)    Abs Immature Granulocytes 0.08 (*)    All other components within normal limits  URINALYSIS, COMPLETE (UACMP) WITH MICROSCOPIC - Abnormal; Notable for the following components:   Color, Urine YELLOW (*)    APPearance CLEAR (*)    All other components within normal limits  URINE CULTURE   ____________________________________________   EKG    ____________________________________________    RADIOLOGY  No results found.  ____________________________________________   PROCEDURES Procedures  ____________________________________________  DIFFERENTIAL DIAGNOSIS   UTI, dehydration, electrolyte disturbance, stroke  CLINICAL IMPRESSION / ASSESSMENT AND PLAN / ED COURSE  Pertinent labs & imaging results that were available during my care of the patient were reviewed by me and considered in my medical decision making (see chart for details).    Patient presents with confusion and dysarthria.  Out of window for consideration of TPA and age and recent surgery are also contraindications.  Would not be a candidate for angiogram or possibly endovascular intervention.  Labs unremarkable, no signs of urinary tract infection.  Vital signs unremarkable.  At this point patient will need to be hospitalized for further stroke work-up.  CT scan pending.  Doubt intracranial hemorrhage given lack of history of head trauma and sudden change in symptomatology.      ____________________________________________   FINAL CLINICAL IMPRESSION(S) / ED DIAGNOSES    Final diagnoses:  Confusion  Dysarthria     ED Discharge Orders    None      Portions of this note were generated with dragon dictation software. Dictation errors may occur despite best  attempts at proofreading.   Carrie Mew, MD 05/20/18 [place],  Doren Custard, MD 05/20/18 [phone]

## 2018-05-21 DIAGNOSIS — I639 Cerebral infarction, unspecified: Secondary | ICD-10-CM

## 2018-05-21 LAB — CBC
HCT: 26.9 % — ABNORMAL LOW (ref 36.0–46.0)
Hemoglobin: 8.6 g/dL — ABNORMAL LOW (ref 12.0–15.0)
MCH: 32 pg (ref 26.0–34.0)
MCHC: 32 g/dL (ref 30.0–36.0)
MCV: 100 fL (ref 80.0–100.0)
Platelets: 231 10*3/uL (ref 150–400)
RBC: 2.69 MIL/uL — ABNORMAL LOW (ref 3.87–5.11)
RDW: 14.5 % (ref 11.5–15.5)
WBC: 9.2 10*3/uL (ref 4.0–10.5)
nRBC: 0.2 % (ref 0.0–0.2)

## 2018-05-21 LAB — BASIC METABOLIC PANEL
Anion gap: 8 (ref 5–15)
BUN: 16 mg/dL (ref 8–23)
CHLORIDE: 99 mmol/L (ref 98–111)
CO2: 24 mmol/L (ref 22–32)
Calcium: 7.7 mg/dL — ABNORMAL LOW (ref 8.9–10.3)
Creatinine, Ser: 0.61 mg/dL (ref 0.44–1.00)
GFR calc Af Amer: >60 mL/min (ref >60)
GFR calc non Af Amer: >60 mL/min (ref >60)
Glucose, Bld: 111 mg/dL — ABNORMAL HIGH (ref 70–99)
Potassium: 3.7 mmol/L (ref 3.5–5.1)
Sodium: 131 mmol/L — ABNORMAL LOW (ref 135–145)

## 2018-05-21 LAB — ECHOCARDIOGRAM COMPLETE
Height: 67 in
Weight: 2081.14 oz

## 2018-05-21 LAB — URINE CULTURE: Culture: NO GROWTH

## 2018-05-21 MED ORDER — HYDRALAZINE HCL 20 MG/ML IJ SOLN
10.0000 mg | Freq: Four times a day (QID) | INTRAMUSCULAR | Status: DC | PRN
  Administered 2018-05-21 – 2018-05-23 (×2): 10 mg via INTRAVENOUS
  Filled 2018-05-21 (×2): qty 1

## 2018-05-21 MED ORDER — ATORVASTATIN CALCIUM 20 MG PO TABS
40.0000 mg | ORAL_TABLET | Freq: Every day | ORAL | Status: DC
  Administered 2018-05-21 – 2018-05-24 (×4): 40 mg via ORAL
  Filled 2018-05-21 (×5): qty 2

## 2018-05-21 MED ORDER — OXYBUTYNIN CHLORIDE 5 MG PO TABS: 2.5000 mg | ORAL_TABLET | Freq: Two times a day (BID) | ORAL | Status: DC

## 2018-05-21 NOTE — Care Management Note (Signed)
Case Management Note  Patient Details  Name: Christine [name] MRN: [id number] Date of Birth: [birth year]  Subjective/Objective:   Admitted to [facility] with the diagnosis of CVA. Discharged from this facility 05/18/18. Daughter is [name] [phone]). Sees Dr. Candiss Norse as primary care.  [facility] was arranged prior to last discharge. Hospital bed arranged prior to last discharge.  Palliative Care ordered                  Action/Plan: Will continue to follow for transition of care needs.   Expected Discharge Date:                  Expected Discharge Plan:     In-House Referral:   yes  Discharge planning Services     Post Acute Care Choice:    Choice offered to:     DME Arranged:    DME Agency:     HH Arranged:    HH Agency:     Status of Service:     If discussed at [employer] [facility] [employer], dates discussed:    Additional Comments:  Shelbie Ammons, RN MSN CCM Care Management [phone] 05/21/2018, 10:10 AM

## 2018-05-21 NOTE — Progress Notes (Signed)
[facility] NAME: Christine [name]    MR#:  [id number]  DATE OF BIRTH:  06/09/1920  SUBJECTIVE:  CHIEF COMPLAINT:   Chief Complaint  Patient presents with  . Altered Mental Status   In bed with daughter at bedside. Complaining only of painful bladder spasms. Daughter requesting medications and further workup for this. POD # 6 s/p hip ORIF  REVIEW OF SYSTEMS:  Review of Systems  Constitutional: Positive for malaise/fatigue. Negative for chills and fever.  HENT: Positive for hearing loss. Negative for congestion, ear pain, sinus pain and sore throat.   Eyes: Negative for blurred vision and pain.  Respiratory: Negative for cough, shortness of breath and wheezing.   Cardiovascular: Negative for chest pain, palpitations, orthopnea and leg swelling.  Gastrointestinal: Positive for abdominal pain (suprapubic). Negative for constipation, diarrhea, nausea and vomiting.  Genitourinary: Positive for urgency ("bladder spasm"). Negative for dysuria, flank pain and hematuria.  Musculoskeletal: Positive for joint pain. Negative for myalgias.  Skin: Negative for itching and rash.  Neurological: Negative for dizziness, sensory change, speech change, focal weakness, seizures, weakness and headaches.  Psychiatric/Behavioral: Negative for depression. The patient is not nervous/anxious.     DRUG ALLERGIES:   Allergies  Allergen Reactions  . Adhesive [Tape] Other (See Comments)    Red itchy  . Tapentadol Other (See Comments)    Red itchy   VITALS:  Blood pressure (!) 178/75, pulse 92, temperature 97.8 F (36.6 C), temperature source Oral, resp. rate 18, height 5\' 7"  (1.702 m), weight 59 kg, SpO2 97 %. PHYSICAL EXAMINATION:  GENERAL:  85 y.o.-year-old patient lying in the bed with brief recurrent episodes of acute discomfort.    EYES: Pupils equal, round, reactive to light and accommodation. No scleral icterus. Extraocular muscles intact.    HEENT: Head atraumatic, normocephalic. Oropharynx and nasopharynx clear. No oropharyngeal erythema, moist oral mucosa  NECK:  Supple, no jugular venous distention. No thyroid enlargement, no tenderness.  LUNGS: Normal breath sounds bilaterally anteriorly, exam limited by patient's unwillingness to turn over or sit up in bed, no wheezing, rales, rhonchi. No use of accessory muscles of respiration.  CARDIOVASCULAR: S1, S2 normal. No murmurs, rubs, or gallops.  ABDOMEN: Tender to palpation at the suprapubic region. Soft, nondistended. Bowel sounds present. No organomegaly or mass. Unable to assess CVA tenderness, she was not willing to sit up.  EXTREMITIES: L hip with proximal and distal incision site dressings, proximal dressing saturated with serosanguinous drainage. Area of ecchymosis present. No pedal edema, cyanosis, or clubbing. + 2 pedal & radial pulses b/l.   NEUROLOGIC: Cranial nerves II through XII are intact.  No dysarthria noted. Following instructions without difficulty.  Difficulty moving her left lower extremity due to pain from recent hip fracture. PSYCHIATRIC: The patient is alert and awake.  Pleasantly confused SKIN: No obvious rash, lesion, or ulcer.  LABORATORY PANEL:  Female CBC Recent Labs  Lab 05/21/18 0925  WBC 9.2  HGB 8.6*  HCT 26.9*  PLT 231   ------------------------------------------------------------------------------------------------------------------ Chemistries  Recent Labs  Lab 05/16/18 0658  05/21/18 0925  NA 134*   < > 131*  K 4.4   < > 3.7  CL 101   < > 99  CO2 26   < > 24  GLUCOSE 120*   < > 111*  BUN 20   < > 16  CREATININE 0.88   < > 0.61  CALCIUM 7.4*   < > 7.7*  MG 1.9  --   --    < > =  values in this interval not displayed.   RADIOLOGY:  Ct Head Wo Contrast  Result Date: 05/20/2018 CLINICAL DATA:  Altered mental status since last night. Recent fall. EXAM: CT HEAD WITHOUT CONTRAST TECHNIQUE: Contiguous axial images were obtained from the  base of the skull through the vertex without intravenous contrast. COMPARISON:  MRI of the head December 26, 2011 FINDINGS: BRAIN: New LEFT thalamus 1 cm infarct. No intraparenchymal hemorrhage, mass effect nor midline shift. No parenchymal brain volume loss for age. No hydrocephalus. Patchy supratentorial white matter hypodensities less than expected for patient's age, though non-specific are most compatible with chronic small vessel ischemic disease. No acute large vascular territory infarcts. No abnormal extra-axial fluid collections. Basal cisterns are patent. VASCULAR: Moderate to severe calcific atherosclerosis of the carotid siphons. SKULL: No skull fracture. No significant scalp soft tissue swelling. SINUSES/ORBITS: Trace paranasal sinus mucosal thickening. Mastoid air cells are well aerated.The included ocular globes and orbital contents are non-suspicious. Status post bilateral ocular lens implants. OTHER: None. IMPRESSION: 1. New age indeterminate LEFT thalamus small infarcts. 2. Otherwise similar examination given differences in imaging technique including mild chronic small vessel ischemic changes. Electronically Signed   By: Elon Alas M.D.   On: 05/20/2018 16:38   Mr Brain Wo Contrast  Result Date: 05/20/2018 CLINICAL DATA:  Altered mental status and dysarthria. EXAM: MRI HEAD WITHOUT CONTRAST MRA HEAD WITHOUT CONTRAST TECHNIQUE: Multiplanar, multiecho pulse sequences of the brain and surrounding structures were obtained without intravenous contrast. Angiographic images of the head were obtained using MRA technique without contrast. COMPARISON:  Head CT 05/20/2018 Brain MRI 12/26/2011 FINDINGS: MRI HEAD FINDINGS BRAIN: There is a focus of acute ischemia within the left paramedian thalamus. No other site of abnormal diffusion restriction. Unchanged size of intra sellar mass measuring 1.5 cm craniocaudal by 1.4 cm AP. There are no old infarcts. Early confluent hyperintense T2-weighted signal of  the periventricular and deep white matter, most commonly due to chronic ischemic microangiopathy. The CSF spaces are normal for age, with no hydrocephalus. Susceptibility-sensitive sequences show no chronic microhemorrhage or superficial siderosis. SKULL AND UPPER CERVICAL SPINE: The visualized skull base, calvarium, upper cervical spine and extracranial soft tissues are normal. SINUSES/ORBITS: No fluid levels or advanced mucosal thickening. No mastoid or middle ear effusion. The orbits are normal. MRA HEAD FINDINGS The MRA is markedly degraded by motion. There is antegrade flow within both internal carotid arteries at the skull base. The origins of the anterior and middle cerebral arteries are patent. There is a P-comm present on the right. Both posterior cerebral arteries are patent proximally. There is multifocal narrowing of the basilar artery, which remains patent. It is unclear whether the narrowing is artifactual. IMPRESSION: 1. Acute infarct of the medial left thalamus. No hemorrhage or mass effect. 2. Severely motion degraded MRA without a visible proximal large vessel occlusion. Electronically Signed   By: Ulyses Jarred M.D.   On: 05/20/2018 23:18   US Carotid Bilateral (at [facility] And [facility] Only)  Result Date: 05/20/2018 CLINICAL DATA:  Slurred speech, confusion and right-sided weakness. EXAM: BILATERAL CAROTID DUPLEX ULTRASOUND TECHNIQUE: Pearline Cables scale imaging, color Doppler and duplex ultrasound were performed of bilateral carotid and vertebral arteries in the neck. COMPARISON:  None. FINDINGS: Criteria: Quantification of carotid stenosis is based on velocity parameters that correlate the residual internal carotid diameter with NASCET-based stenosis levels, using the diameter of the distal internal carotid lumen as the denominator for stenosis measurement. The following velocity measurements were obtained: RIGHT ICA:  89/24 cm/sec  CCA:  76/5 cm/sec SYSTOLIC ICA/CCA RATIO:  1.3 ECA:  67 cm/sec LEFT ICA:  65/14  cm/sec CCA:  46/5 cm/sec SYSTOLIC ICA/CCA RATIO:  0.9 ECA:  81 cm/sec RIGHT CAROTID ARTERY: There is a mild amount of partially calcified plaque at the level of the carotid bulb. No focal plaque in the ICA or evidence of ICA stenosis. RIGHT VERTEBRAL ARTERY: Antegrade flow with normal waveform and velocity. LEFT CAROTID ARTERY: Mild amount of mostly noncalcified plaque at the level of the carotid bulb. No evidence of ICA plaque or stenosis. LEFT VERTEBRAL ARTERY: Antegrade flow with normal waveform and velocity. IMPRESSION: Mild plaque at the level of both carotid bulbs. No evidence of ICA plaque or stenosis bilaterally. Electronically Signed   By: Aletta Edouard M.D.   On: 05/20/2018 18:24   Mr Jodene Nam Head/brain KP Cm  Result Date: 05/20/2018 CLINICAL DATA:  Altered mental status and dysarthria. EXAM: MRI HEAD WITHOUT CONTRAST MRA HEAD WITHOUT CONTRAST TECHNIQUE: Multiplanar, multiecho pulse sequences of the brain and surrounding structures were obtained without intravenous contrast. Angiographic images of the head were obtained using MRA technique without contrast. COMPARISON:  Head CT 05/20/2018 Brain MRI 12/26/2011 FINDINGS: MRI HEAD FINDINGS BRAIN: There is a focus of acute ischemia within the left paramedian thalamus. No other site of abnormal diffusion restriction. Unchanged size of intra sellar mass measuring 1.5 cm craniocaudal by 1.4 cm AP. There are no old infarcts. Early confluent hyperintense T2-weighted signal of the periventricular and deep white matter, most commonly due to chronic ischemic microangiopathy. The CSF spaces are normal for age, with no hydrocephalus. Susceptibility-sensitive sequences show no chronic microhemorrhage or superficial siderosis. SKULL AND UPPER CERVICAL SPINE: The visualized skull base, calvarium, upper cervical spine and extracranial soft tissues are normal. SINUSES/ORBITS: No fluid levels or advanced mucosal thickening. No mastoid or middle ear effusion. The orbits are  normal. MRA HEAD FINDINGS The MRA is markedly degraded by motion. There is antegrade flow within both internal carotid arteries at the skull base. The origins of the anterior and middle cerebral arteries are patent. There is a P-comm present on the right. Both posterior cerebral arteries are patent proximally. There is multifocal narrowing of the basilar artery, which remains patent. It is unclear whether the narrowing is artifactual. IMPRESSION: 1. Acute infarct of the medial left thalamus. No hemorrhage or mass effect. 2. Severely motion degraded MRA without a visible proximal large vessel occlusion. Electronically Signed   By: Ulyses Jarred M.D.   On: 05/20/2018 23:18   ASSESSMENT AND PLAN:   Ohana Birdwell  is a 85 y.o. female with a known history of recent hip fracture, hypothyroidism presents to the emergency room due to acute onset of dysarthria, confusion.  Patient was last known well at 2:15 AM 05/19/2018 when she spoke with her granddaughter and was oriented and clear with her speech.  Had dysarthria on presentation. CT scan of the head showed left thalamic CVA.  Urinalysis normal.  Patient was recently in the hospital for hip fracture and her granddaughter mentions she barely took 3 steps and was discharged home with home health.  Home health was supposed to come in today but patient ended up in the hospital.  No confusion at home prior to this episode.  *Acute left thalamic CVA with dysarthria, right-sided weakness and confusion. Resolving symptomatically. -Consulted neurology, seen by NP Ouma -Checked MRI of the brain, Carotid dopplers, Echo, per neuro review: "MRI of the brain reviewed and shows acute infarct of the medial left thalamus.  Etiology likely small vessel disease in the setting of elevated blood pressure on presentation. MRA head shows no large vessel occlusion or significant stenosis. Ultrasound carotid bilateral shows no evidence of hemodynamically significant stenosis.  Echocardiogram did not show cardiac source of emboli EF 45 to 50%. HgbA1c 5.3, LDL 29." - Started aspirin.  Started statin. - Lovenox for DVT prophylaxis. - PT/OT/Speech consults - Neuro checks every 4 hours for the first 24 hours. - was not in the TPA window  *Elevated blood pressure without diagnosis of hypertension.  Likely due to acute CVA.  No history of hypertension.  Permissive hypertension protocol discontinued now with hydralazine to keep SBP <140 per neurology.   *Recent hip fracture.  POD # 6 L hip intramedullary fixation. Continue physical therapy. Consulted orthopedic surgery, Dr. Roland Rack. WBAT.  Tramadol and acetaminophen for pain. I discontinued morphine at her daughter's request. She does not want her to be sedated. Can reorder if needed for severe pain. Dressing changes per RN.   *Suprapubic pain Negative UA with negative urine culture. Describing "spasm" like pain which is recurrent. Consult placed to urology at daughter's request for evaluation. Bladder analgesics and anti-spasm agents deferred given her overall condition and advanced age. Will defer to urology eval.   *DVT prophylaxis with Lovenox    All the records are reviewed and case is discussed with Care Management/Social Worker. Management plans discussed with the patient and/or family and they are in agreement.  CODE STATUS: DNR  TOTAL TIME TAKING CARE OF THIS PATIENT: 30 minutes.   More than 50% of the time was spent in counseling/coordination of care: YES  POSSIBLE D/C IN 1-2 DAYS, DEPENDING ON CLINICAL CONDITION.   Sean Macwilliams PA-C on 05/21/2018 at 2:25 PM  Between 7am to 6pm - Pager - [phone]  After 6 pm go to [email or web address] - Proofreader  [facility]  Office  [phone]  CC: Primary care physician; Glendon Axe, MD  Note: This dictation was prepared with Dragon dictation along with smaller phrase technology. Any transcriptional errors that result from  this process are unintentional.

## 2018-05-21 NOTE — Clinical Social Work Note (Signed)
Clinical Social Work Assessment  Patient Details  Name: Christine [name] MRN: [id number] Date of Birth: November 14, 1920  Date of referral:  05/21/18               Reason for consult:  Discharge Planning                Permission sought to share information with:    Permission granted to share information::     Name::        Agency::     Relationship::     Contact Information:     Housing/Transportation Living arrangements for the past 2 months:  Single Family Home Source of Information:  Adult Children Patient Interpreter Needed:  None Criminal Activity/Legal Involvement Pertinent to Current Situation/Hospitalization:  No - Comment as needed Significant Relationships:  Adult Children, Other(Comment)(granddaughter ) Lives with:  Other (Comment)(granddaughter ) Do you feel safe going back to the place where you live?    Need for family participation in patient care:  Yes (Comment)  Care giving concerns:  Patient lives in [place] with her granddaughter Christine [name].    Social Worker assessment / plan:  Holiday representative (CSW) reviewed chart and noted that PT is recommending SNF. CSW met with patient and her daughter Christine [name] was at bedside. Patient did not participate in assessment. CSW introduced self and explained role of CSW department. Per daughter she lives in [place] and takes care of patient on the weekends. Patient lives in [place] with her granddaughter Christine [name]. Per daughter she prefers to take patient home from [facility] and continue with [facility] for home health. CSW explained SNF option and daughter declined SNF. Per daughter patient has a hospital bed at home. Daughter reported that she believes patent is improving and wants patient to do PT at home. CSW provided emotional support. CSW will continue to follow and assist as needed.   Employment status:  Disabled (Comment on whether or not currently receiving Disability), Retired Nurse, adult PT  Recommendations:  [place] / Referral to community resources:  Other (Comment Required)(Patient's daughter Christine [name] prefers to take patient home. )  Patient/Family's Response to care:  Patient's daughter prefers to take patient home.   Patient/Family's Understanding of and Emotional Response to Diagnosis, Current Treatment, and Prognosis:  Patient's daughter was very pleasant and thanked CSW for visit.   Emotional Assessment Appearance:  Appears stated age Attitude/Demeanor/Rapport:  Unable to Assess Affect (typically observed):  Unable to Assess Orientation:  Oriented to Self, Fluctuating Orientation (Suspected and/or reported Sundowners) Alcohol / Substance use:  Not Applicable Psych involvement (Current and /or in the community):  No (Comment)  Discharge Needs  Concerns to be addressed:  Discharge Planning Concerns Readmission within the last 30 days:  No Current discharge risk:  Dependent with Mobility, Cognitively Impaired, Chronically ill Barriers to Discharge:  Continued Medical Work up   [employer], Veronia Beets, LCSW 05/21/2018, 6:02 PM

## 2018-05-21 NOTE — Progress Notes (Signed)
Family Meeting Note  Advance Directive:yes  Today a meeting took place with the Patient and Daughter at bedside.  Patient is unable to participate due [facility] capacity Mental status changes   The following clinical team members were present during this meeting:MD  The following were discussed:Patient's diagnosis:  85 y.o. female with a known history of recent hip fracture, hypothyroidism admitted due to acute onset of dysarthria, confusion and found to have acute CVA  Past Medical History:  Diagnosis Date  . Cancer ([place])    colon  . Carpal tunnel syndrome   . Complication of anesthesia    hard time waking up after thyroidectomy  . DJD (degenerative joint disease)   . Family history of adverse reaction to anesthesia    daughter PONV  . Hearing loss   . Hypothyroidism   . Osteoporosis   . Wears glasses    Patient's progosis: < 6 months and Goals for treatment: DNR  Additional follow-up to be provided: Palliative care c/s - Home with Hospice  Time spent during discussion:16 mins  Max Sane, MD

## 2018-05-21 NOTE — Consult Note (Signed)
Referring Physician: Max Sane, MD    Chief Complaint: Confusion and dysarthria  HPI: Christine [name] is an 85 y.o. female with past medical history of hypothyroidism, osteoporosis, colon cancer, DJD, hard of hearing, and frequent falls presenting to the ED 05/20/2018 with altered mental status and dysarthria.  Per ED reports patient was last known well around 2:15 AM in which she was conversant and oriented x4.  The following morning patient was noted to have trouble speaking and appeared confused.  Patient was recently admitted for left hip fracture secondary to mechanical fall status post ORIF with AOS days TFN nail 05/15/2018.  There was no reports of recent witnessed fall by family member since her last fall, due to concerns of possible UTI patient was brought into the ED for further evaluation. On arrival to the ED, She was afebrile with blood pressure 177/60 mm Hg and pulse rate 72 beats/min. There were no focal neurological deficits; She was alert and oriented to person and place.  Initial labs revealed slightly elevated WBC 11.1, hemoglobin 8.4, glucose 116, calcium 7.8, urinalysis was negative for UTI.  ECG showed sinus rhythm of 51 beats per minute. A non-contrast head CT showed an acute infarction in the left thalamus. An MRI of the head confirmed the thalamic infarction; MRA of the head was unremarkable with no large vessel occlusion. During hospitalization, echocardiogram did not revealed cardiac source of emboli, EF of 45 to 50%; the blood work, including hypercoagulability studies, lipid panel, and HbA1c were normal. The patient has remained free from recurrent events and maintains a secondary prevention medication regimen including 81mg  of aspirin and 40mg  of atorvastatin.  Date last known well: Date: 05/19/2018 Time last known well: Time: 02:15 tPA Given: No: Outside window period, recent surgery  Past Medical History:  Diagnosis Date  . Cancer ([place])    colon  . Carpal tunnel syndrome    . Complication of anesthesia    hard time waking up after thyroidectomy  . DJD (degenerative joint disease)   . Family history of adverse reaction to anesthesia    daughter PONV  . Hearing loss   . Hypothyroidism   . Osteoporosis   . Wears glasses     Past Surgical History:  Procedure Laterality Date  . ABDOMINAL HYSTERECTOMY    . COLON RESECTION  1995   for colon cancer  . EYE SURGERY Bilateral    cataract surgery  . INTRAMEDULLARY (IM) NAIL INTERTROCHANTERIC Right 01/09/2015   Procedure: INTRAMEDULLARY (IM) NAIL INTERTROCHANTRIC;  Surgeon: Corky Mull, MD;  Location: [facility] ORS;  Service: Orthopedics;  Laterality: Right;  . INTRAMEDULLARY (IM) NAIL INTERTROCHANTERIC Left 05/15/2018   Procedure: INTRAMEDULLARY (IM) NAIL INTERTROCHANTRIC - LEFT FEMUR;  Surgeon: Corky Mull, MD;  Location: [facility] ORS;  Service: Orthopedics;  Laterality: Left;  . Lung surgery     as a child for empyema  . OOPHORECTOMY    . THYROID SURGERY  1967   for multinodular goitre  . TOTAL HIP REVISION Right 10/24/2016   Procedure: TOTAL HIP REVISION AND HARDWARE REMOVAL;  Surgeon: Corky Mull, MD;  Location: [facility] ORS;  Service: Orthopedics;  Laterality: Right;    Family History  Problem Relation Age of Onset  . Heart disease Father   . Diabetes Sister   . Cancer Sister   . Diabetes Brother   . Cancer Brother   . Cancer Mother    Social History:  reports that she has never smoked. She has never used smokeless tobacco.  She reports that she does not drink alcohol or use drugs.  Allergies:  Allergies  Allergen Reactions  . Adhesive [Tape] Other (See Comments)    Red itchy  . Tapentadol Other (See Comments)    Red itchy    Medications:  I have reviewed the patient's current medications. Prior to Admission:  Medications Prior to Admission  Medication Sig Dispense Refill Last Dose  . acetaminophen (TYLENOL) 325 MG tablet Take 650 mg by mouth every 6 (six) hours as needed for pain.   prn at prn   . calcium carbonate (OSCAL) 1500 (600 Ca) MG TABS tablet Take 600 mg by mouth 2 (two) times daily with a meal.   05/19/2018 at 0700  . Cholecalciferol (KP VITAMIN D3) 2000 units CAPS Take 2,000 Units by mouth daily.    05/19/2018 at 0700  . cholecalciferol (VITAMIN D) 1000 units tablet Take 1,000 Units by mouth every other day.   05/19/2018 at 0700  . furosemide (LASIX) 20 MG tablet Take 20 mg by mouth as needed.   Past Week at prn  . levothyroxine (SYNTHROID, LEVOTHROID) 112 MCG tablet Take 112 mcg by mouth daily.   05/20/2018 at 0700  . Melatonin 10 MG TABS Take 10 mg by mouth at bedtime.   Past Week at Unknown time  . pantoprazole (PROTONIX) 40 MG tablet Take 40 mg by mouth 2 (two) times daily.   05/19/2018 at 0700  . potassium chloride (K-DUR) 10 MEQ tablet Take 10 mEq by mouth as needed.   05/19/2018 at 0700  . raloxifene (EVISTA) 60 MG tablet Take 60 mg by mouth daily.    05/19/2018 at 0700  . traMADol (ULTRAM) 50 MG tablet Take 1 tablet (50 mg total) by mouth every 6 (six) hours as needed for up to 7 days for moderate pain. 28 tablet 0 05/19/2018 at 2100  . docusate sodium (COLACE) 100 MG capsule Take 1 capsule (100 mg total) by mouth 2 (two) times daily. 60 capsule 0 prn at prn  . LUBRICANT EYE DROPS 0.4-0.3 % SOLN Place 1 drop into both eyes 3 (three) times daily as needed. For dry eyes.   prn at prn  . oxyCODONE (OXY IR/ROXICODONE) 5 MG immediate release tablet Take 1 tablet (5 mg total) by mouth every 12 (twelve) hours as needed for up to 3 days for moderate pain (pain score 4-6). 6 tablet 0 prn at prn  . polyethylene glycol (MIRALAX / GLYCOLAX) packet Take 17 g by mouth daily as needed for mild constipation. 14 each 0 prn at prn   Scheduled: . aspirin  300 mg Rectal Daily   Or  . aspirin  325 mg Oral Daily  . atorvastatin  40 mg Oral q1800  . docusate sodium  100 mg Oral BID  . enoxaparin (LOVENOX) injection  40 mg Subcutaneous Q24H  . levothyroxine  112 mcg Oral Q0600  . Melatonin  10 mg Oral  QHS  . pantoprazole  40 mg Oral BID    ROS: Unable to obtain from patient due to current mental status  Physical Examination: Blood pressure (!) 178/75, pulse 92, temperature 97.8 F (36.6 C), temperature source Oral, resp. rate 18, height 5\' 7"  (1.702 m), weight 59 kg, SpO2 97 %.   HEENT-  Normocephalic, no lesions, without obvious abnormality.  Normal external eye and conjunctiva.  Normal TM's bilaterally.  Normal auditory canals and external ears. Normal external nose, mucus membranes and septum.  Normal pharynx. Cardiovascular- S1, S2 normal, pulses palpable throughout  Lungs- chest clear, no wheezing, rales, normal symmetric air entry Abdomen- soft, non-tender; bowel sounds normal; no masses,  no organomegaly Extremities- no edema Lymph-no adenopathy palpable Musculoskeletal-no joint tenderness, deformity or swelling Skin-warm and dry, no hyperpigmentation, vitiligo, or suspicious lesions  Neurological Exam   Mental Status: Alert, oriented to person but not to time, place or situation. Able to identify daughter by name and relationship to her. thought content appropriate.  Speech fluent without evidence of aphasia.  Able to follow 3 step commands without difficulty. Attention span and concentration seemed appropriate  Cranial Nerves: II: Discs flat bilaterally; Visual fields grossly normal, pupils equal, round, reactive to light and accommodation III,IV, VI: ptosis not present, extra-ocular motions intact bilaterally V,VII: smile symmetric, facial light touch sensation intact VIII: hearing normal bilaterally IX,X: gag reflex present XI: bilateral shoulder shrug XII: midline tongue extension Motor: Right :  Upper extremity   5/5 Without pronator drift      Left: Upper extremity   5/5 without pronator drift Right:   Lower extremity   4/5                                          Left: Lower extremity   4/5 Tone and bulk:normal tone throughout; no atrophy noted Sensory: Pinprick  and light touch intact bilaterally Deep Tendon Reflexes: 2+ and symmetric throughout Plantars: Right: mute                              Left: mute Cerebellar: Unable to assess, difficulty with comprehension, very hard of hearing Gait: not tested due to safety concerns  Data Reviewed  Laboratory Studies:  Basic Metabolic Panel: Recent Labs  Lab 05/14/18 2331 05/16/18 0658 05/17/18 0421 05/18/18 0329 05/20/18 1233  NA 135 134* 135 134* 136  K 3.7 4.4 4.1 4.2 4.3  CL 101 101 101 100 101  CO2 26 26 28 28 25   GLUCOSE 144* 120* 122* 117* 116*  BUN 16 20 18 18 20   CREATININE 0.92 0.88 0.86 0.75 0.68  CALCIUM 8.4* 7.4* 7.6* 7.7* 7.8*  MG  --  1.9  --   --   --   PHOS  --  3.6  --   --   --     Liver Function Tests: No results for input(s): AST, ALT, ALKPHOS, BILITOT, PROT, ALBUMIN in the last 168 hours. No results for input(s): LIPASE, AMYLASE in the last 168 hours. No results for input(s): AMMONIA in the last 168 hours.  CBC: Recent Labs  Lab 05/14/18 2208 05/16/18 0658 05/17/18 0421 05/17/18 1644 05/20/18 1233 05/21/18 0925  WBC 10.5 11.8* 11.3* 13.1* 11.1* 9.2  NEUTROABS 6.2  --   --   --  8.6*  --   HGB 12.0 8.3* 7.3* 9.1* 8.4* 8.6*  HCT 35.8* 25.4* 22.7* 27.2* 26.5* 26.9*  MCV 97.5 100.8* 101.8* 96.5 101.5* 100.0  PLT 247 156 146* 150 192 231    Cardiac Enzymes: No results for input(s): CKTOTAL, CKMB, CKMBINDEX, TROPONINI in the last 168 hours.  BNP: Invalid input(s): POCBNP  CBG: No results for input(s): GLUCAP in the last 168 hours.  Microbiology: Results for orders placed or performed during the hospital encounter of 05/14/18  MRSA PCR Screening     Status: None   Collection Time: 05/15/18  6:05 AM  Result Value Ref  Range Status   MRSA by PCR NEGATIVE NEGATIVE Final    Comment:        The GeneXpert MRSA Assay (FDA approved for NASAL specimens only), is one component of a comprehensive MRSA colonization surveillance program. It is not intended  to diagnose MRSA infection nor to guide or monitor treatment for MRSA infections. Performed at [facility], [address] [place],  [address]     Coagulation Studies: No results for input(s): LABPROT, INR in the last 72 hours.  Urinalysis:  Recent Labs  Lab 05/20/18 1321  COLORURINE YELLOW*  LABSPEC 1.015  PHURINE 7.0  GLUCOSEU NEGATIVE  HGBUR NEGATIVE  BILIRUBINUR NEGATIVE  KETONESUR NEGATIVE  PROTEINUR NEGATIVE  NITRITE NEGATIVE  LEUKOCYTESUR NEGATIVE    Lipid Panel:    Component Value Date/Time   CHOL 139 05/20/2018 1233   TRIG 72 05/20/2018 1233   HDL 46 05/20/2018 1233   CHOLHDL 3.0 05/20/2018 1233   VLDL 14 05/20/2018 1233   LDLCALC 79 05/20/2018 1233    HgbA1C:  Lab Results  Component Value Date   HGBA1C 5.3 05/20/2018    Urine Drug Screen:  No results found for: LABOPIA, COCAINSCRNUR, LABBENZ, AMPHETMU, THCU, LABBARB  Alcohol Level: No results for input(s): ETH in the last 168 hours.  Other results: EKG: normal EKG, normal sinus rhythm, unchanged from previous tracings. Vent. rate 83 BPM PR interval * ms QRS duration 109 ms QT/QTc 395/447 ms P-R-T axes 32 125 44  Imaging: Ct Head Wo Contrast  Result Date: 05/20/2018 CLINICAL DATA:  Altered mental status since last night. Recent fall. EXAM: CT HEAD WITHOUT CONTRAST TECHNIQUE: Contiguous axial images were obtained from the base of the skull through the vertex without intravenous contrast. COMPARISON:  MRI of the head December 26, 2011 FINDINGS: BRAIN: New LEFT thalamus 1 cm infarct. No intraparenchymal hemorrhage, mass effect nor midline shift. No parenchymal brain volume loss for age. No hydrocephalus. Patchy supratentorial white matter hypodensities less than expected for patient's age, though non-specific are most compatible with chronic small vessel ischemic disease. No acute large vascular territory infarcts. No abnormal extra-axial fluid collections. Basal cisterns are patent.  VASCULAR: Moderate to severe calcific atherosclerosis of the carotid siphons. SKULL: No skull fracture. No significant scalp soft tissue swelling. SINUSES/ORBITS: Trace paranasal sinus mucosal thickening. Mastoid air cells are well aerated.The included ocular globes and orbital contents are non-suspicious. Status post bilateral ocular lens implants. OTHER: None. IMPRESSION: 1. New age indeterminate LEFT thalamus small infarcts. 2. Otherwise similar examination given differences in imaging technique including mild chronic small vessel ischemic changes. Electronically Signed   By: Elon Alas M.D.   On: 05/20/2018 16:38   Mr Brain Wo Contrast  Result Date: 05/20/2018 CLINICAL DATA:  Altered mental status and dysarthria. EXAM: MRI HEAD WITHOUT CONTRAST MRA HEAD WITHOUT CONTRAST TECHNIQUE: Multiplanar, multiecho pulse sequences of the brain and surrounding structures were obtained without intravenous contrast. Angiographic images of the head were obtained using MRA technique without contrast. COMPARISON:  Head CT 05/20/2018 Brain MRI 12/26/2011 FINDINGS: MRI HEAD FINDINGS BRAIN: There is a focus of acute ischemia within the left paramedian thalamus. No other site of abnormal diffusion restriction. Unchanged size of intra sellar mass measuring 1.5 cm craniocaudal by 1.4 cm AP. There are no old infarcts. Early confluent hyperintense T2-weighted signal of the periventricular and deep white matter, most commonly due to chronic ischemic microangiopathy. The CSF spaces are normal for age, with no hydrocephalus. Susceptibility-sensitive sequences show no chronic microhemorrhage or superficial siderosis.  SKULL AND UPPER CERVICAL SPINE: The visualized skull base, calvarium, upper cervical spine and extracranial soft tissues are normal. SINUSES/ORBITS: No fluid levels or advanced mucosal thickening. No mastoid or middle ear effusion. The orbits are normal. MRA HEAD FINDINGS The MRA is markedly degraded by motion. There  is antegrade flow within both internal carotid arteries at the skull base. The origins of the anterior and middle cerebral arteries are patent. There is a P-comm present on the right. Both posterior cerebral arteries are patent proximally. There is multifocal narrowing of the basilar artery, which remains patent. It is unclear whether the narrowing is artifactual. IMPRESSION: 1. Acute infarct of the medial left thalamus. No hemorrhage or mass effect. 2. Severely motion degraded MRA without a visible proximal large vessel occlusion. Electronically Signed   By: Ulyses Jarred M.D.   On: 05/20/2018 23:18   US Carotid Bilateral (at [facility] And [facility] Only)  Result Date: 05/20/2018 CLINICAL DATA:  Slurred speech, confusion and right-sided weakness. EXAM: BILATERAL CAROTID DUPLEX ULTRASOUND TECHNIQUE: Pearline Cables scale imaging, color Doppler and duplex ultrasound were performed of bilateral carotid and vertebral arteries in the neck. COMPARISON:  None. FINDINGS: Criteria: Quantification of carotid stenosis is based on velocity parameters that correlate the residual internal carotid diameter with NASCET-based stenosis levels, using the diameter of the distal internal carotid lumen as the denominator for stenosis measurement. The following velocity measurements were obtained: RIGHT ICA:  89/24 cm/sec CCA:  86/7 cm/sec SYSTOLIC ICA/CCA RATIO:  1.3 ECA:  67 cm/sec LEFT ICA:  65/14 cm/sec CCA:  67/2 cm/sec SYSTOLIC ICA/CCA RATIO:  0.9 ECA:  81 cm/sec RIGHT CAROTID ARTERY: There is a mild amount of partially calcified plaque at the level of the carotid bulb. No focal plaque in the ICA or evidence of ICA stenosis. RIGHT VERTEBRAL ARTERY: Antegrade flow with normal waveform and velocity. LEFT CAROTID ARTERY: Mild amount of mostly noncalcified plaque at the level of the carotid bulb. No evidence of ICA plaque or stenosis. LEFT VERTEBRAL ARTERY: Antegrade flow with normal waveform and velocity. IMPRESSION: Mild plaque at the level of both  carotid bulbs. No evidence of ICA plaque or stenosis bilaterally. Electronically Signed   By: Aletta Edouard M.D.   On: 05/20/2018 18:24   Mr Jodene Nam Head/brain CN Cm  Result Date: 05/20/2018 CLINICAL DATA:  Altered mental status and dysarthria. EXAM: MRI HEAD WITHOUT CONTRAST MRA HEAD WITHOUT CONTRAST TECHNIQUE: Multiplanar, multiecho pulse sequences of the brain and surrounding structures were obtained without intravenous contrast. Angiographic images of the head were obtained using MRA technique without contrast. COMPARISON:  Head CT 05/20/2018 Brain MRI 12/26/2011 FINDINGS: MRI HEAD FINDINGS BRAIN: There is a focus of acute ischemia within the left paramedian thalamus. No other site of abnormal diffusion restriction. Unchanged size of intra sellar mass measuring 1.5 cm craniocaudal by 1.4 cm AP. There are no old infarcts. Early confluent hyperintense T2-weighted signal of the periventricular and deep white matter, most commonly due to chronic ischemic microangiopathy. The CSF spaces are normal for age, with no hydrocephalus. Susceptibility-sensitive sequences show no chronic microhemorrhage or superficial siderosis. SKULL AND UPPER CERVICAL SPINE: The visualized skull base, calvarium, upper cervical spine and extracranial soft tissues are normal. SINUSES/ORBITS: No fluid levels or advanced mucosal thickening. No mastoid or middle ear effusion. The orbits are normal. MRA HEAD FINDINGS The MRA is markedly degraded by motion. There is antegrade flow within both internal carotid arteries at the skull base. The origins of the anterior and middle cerebral arteries are patent. There is  a P-comm present on the right. Both posterior cerebral arteries are patent proximally. There is multifocal narrowing of the basilar artery, which remains patent. It is unclear whether the narrowing is artifactual. IMPRESSION: 1. Acute infarct of the medial left thalamus. No hemorrhage or mass effect. 2. Severely motion degraded MRA  without a visible proximal large vessel occlusion. Electronically Signed   By: Ulyses Jarred M.D.   On: 05/20/2018 23:18   Assessment: 85 y.o. female with past medical history of hypothyroidism, osteoporosis, colon cancer, DJD, hard of hearing, and frequent falls presenting to the ED 05/20/2018 with altered mental status and dysarthria.  MRI of the brain reviewed and shows acute infarct of the medial left thalamus.  Etiology likely small vessel disease in the setting of elevated blood pressure on presentation.  MRA head shows no large vessel occlusion or significant stenosis.  Ultrasound carotid bilateral shows no evidence of hemodynamically significant stenosis.  Echocardiogram did not show cardiac source of emboli EF 45 to 50%. HgbA1c 5.3, LDL 29.  Patient was not on any anticoagulation or antiplatelet prior to this event.  Stroke Risk Factors - family history, hyperlipidemia and hypertension  Plan: 1. Start Aspirin 81 mg/day with intensive management of vascular risk factor to keep systolic BP (SBP) <789 mm Hg (130 mm Hg if diabetic) 2. Statin with goal low density lipoprotein (LDL) <70 mg/dl 3. PT consult, OT consult, Speech consult 4. Telemetry monitoring 5. Frequent neuro checks  This patient was staffed with Dr. Irish Elders, Alease Frame who personally evaluated patient, reviewed documentation and agreed with assessment and plan of care as above.  Rufina Falco, DNP, FNP-BC Board certified Nurse Practitioner Neurology Department   05/21/2018, 9:50 AM

## 2018-05-21 NOTE — NC FL2 (Signed)
[place] LEVEL OF CARE SCREENING TOOL     IDENTIFICATION  Patient Name: Christine [name] Birthdate: 1920-09-09 Sex: female Admission Date (Current Location): 05/20/2018  [place] and [place] Number:  Engineering geologist and Address:  [facility], [address], [place], [place] [address]      Provider Number: [id number]  Attending Physician Name and Address:  Max Sane, MD  Relative Name and Phone Number:       Current Level of Care: Hospital Recommended Level of Care: [place] Prior Approval Number:    Date Approved/Denied:   PASRR Number: [id number])  Discharge Plan: SNF    Current Diagnoses: Patient Active Problem List   Diagnosis Date Noted  . CVA (cerebral vascular accident) ([place]) 05/20/2018  . Pressure injury of skin 10/25/2016  . 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 Hip fracture ([place]) 01/09/2015    Orientation RESPIRATION BLADDER Height & Weight     Self  Normal Incontinent Weight: 130 lb 1.1 oz (59 kg) Height:  5\' 7"  (170.2 cm)  BEHAVIORAL SYMPTOMS/MOOD NEUROLOGICAL BOWEL NUTRITION STATUS      Incontinent Diet(Diet: Regular )  AMBULATORY STATUS COMMUNICATION OF NEEDS Skin   Extensive Assist Verbally Surgical wounds(Incision: Left Hip. )                       Personal Care Assistance Level of Assistance  Bathing, Feeding, Dressing Bathing Assistance: Limited assistance Feeding assistance: Limited assistance Dressing Assistance: Limited assistance     Functional Limitations Info  Sight, Hearing, Speech Sight Info: Adequate Hearing Info: Adequate Speech Info: Adequate    SPECIAL CARE FACTORS FREQUENCY  PT (By licensed PT), OT (By licensed OT)     PT Frequency: (5) OT Frequency: (5)            Contractures      Additional Factors Info  Code Status, Allergies Code Status Info: (DNR ) Allergies Info: (Adhesive Tape, Tapentadol)           Current  Medications (05/21/2018):  This is the current hospital active medication list Current Facility-Administered Medications  Medication Dose Route Frequency Provider Last Rate Last Dose  . 0.9 %  sodium chloride infusion   Intravenous Continuous Hillary Bow, MD 50 mL/hr at 05/21/18 1504    . acetaminophen (TYLENOL) tablet 650 mg  650 mg Oral Q4H PRN Hillary Bow, MD       Or  . acetaminophen (TYLENOL) solution 650 mg  650 mg Per Tube Q4H PRN Hillary Bow, MD       Or  . acetaminophen (TYLENOL) suppository 650 mg  650 mg Rectal Q4H PRN Sudini, Alveta Heimlich, MD      . aspirin suppository 300 mg  300 mg Rectal Daily Sudini, Srikar, MD       Or  . aspirin tablet 325 mg  325 mg Oral Daily Hillary Bow, MD   325 mg at 05/21/18 1005  . atorvastatin (LIPITOR) tablet 40 mg  40 mg Oral q1800 Lule, Joana, PA   40 mg at 05/21/18 1735  . docusate sodium (COLACE) capsule 100 mg  100 mg Oral BID Hillary Bow, MD   100 mg at 05/21/18 1005  . enoxaparin (LOVENOX) injection 40 mg  40 mg Subcutaneous Q24H Sudini, Alveta Heimlich, MD   40 mg at 05/20/18 2316  . hydrALAZINE (APRESOLINE) injection 10 mg  10 mg Intravenous Q6H PRN Lule, Joana, PA   10 mg at 05/21/18  1437  . levothyroxine (SYNTHROID, LEVOTHROID) tablet 112 mcg  112 mcg Oral Q0600 Hillary Bow, MD   112 mcg at 05/21/18 0526  . Melatonin TABS 10 mg  10 mg Oral QHS Sudini, Srikar, MD      . pantoprazole (PROTONIX) EC tablet 40 mg  40 mg Oral BID Hillary Bow, MD   40 mg at 05/21/18 1005  . traMADol (ULTRAM) tablet 50 mg  50 mg Oral Q6H PRN Hillary Bow, MD         Discharge Medications: Please see discharge summary for a list of discharge medications.  Relevant Imaging Results:  Relevant Lab Results:   Additional Information (SSN: [id number])  Christine [name], Christine Beets, LCSW

## 2018-05-21 NOTE — Evaluation (Signed)
Physical Therapy Evaluation Patient Details Name: Christine [name] MRN: [id number] DOB: December 09, 1920 Today's Date: 05/21/2018   History of Present Illness  presented to ER secondary to dysarthria, AMS; admitted for TIA/CVA management.  MRI significant for acute L thalamic infarct.  Of note, patient with recent L hip fracture, s/p IM nailing (05/15/18), WBAT.  Clinical Impression  Patient lethargic, sleeping upon arrival to room; awakens to voice and light touch.  Oriented to self only, but does follow simple commands (improved with demonstration from therapist).  Bilat UE/LE generally weak and deconditioned, L LE significantly limited due to post-surgical pain in L hip (family denies any fall/injury since surgery); no obvious focal, neurological weakness appreciated.  OOB activities deferred (per patient/familiy request) due to lethargy and pain to L hip; will continue assessment/progression as medically appropriate. Would benefit from skilled PT to address above deficits and promote optimal return to PLOF; recommend transition to STR upon discharge from acute hospitalization.  Per family, would like to return home if possible; has hospital bed, RW and WC in home environment. May benefit from additional of hoyer lift and PT, OT, RN, aide follow to support.    Follow Up Recommendations SNF(per family, would like to take home if possible)    Equipment Recommendations  (hoyer lift)    Recommendations for Other Services       Precautions / Restrictions Precautions Precautions: Fall Precaution Comments: very HOH Restrictions Weight Bearing Restrictions: Yes LLE Weight Bearing: Weight bearing as tolerated      Mobility  Bed Mobility               General bed mobility comments: deferred secondary to lethargy  Transfers                 General transfer comment: deferred secondary to lethargy  Ambulation/Gait             General Gait Details: deferred secondary to  lethargy  Stairs            Wheelchair Mobility    Modified Rankin (Stroke Patients Only)       Balance                                             Pertinent Vitals/Pain Pain Assessment: Faces Faces Pain Scale: Hurts even more Pain Location: L hip Pain Descriptors / Indicators: Aching;Grimacing;Guarding Pain Intervention(s): Limited activity within patient's tolerance;Monitored during session;Repositioned    Home Living Family/patient expects to be discharged to:: Private residence Living Arrangements: Other relatives(lives w/ granddaughter during the week, daughter on weekends) Available Help at Discharge: Family;Available 24 hours/day Type of Home: House Home Access: Stairs to enter Entrance Stairs-Rails: Right Entrance Stairs-Number of Steps: 2 Home Layout: One level Home Equipment: Bedside commode;Shower seat;Walker - 4 wheels;Cane - single point;Wheelchair - Education administrator (comment);Hospital bed      Prior Function Level of Independence: Needs assistance   Gait / Transfers Assistance Needed: RW with no assist in the house prior to recent fall/L hip fx; since has been in lift recliner and relying on lift to help her stand in addition to family assist; no other falls besides fall that led to L hip fx  ADL's / Homemaking Assistance Needed: family assists her with dressing and bathing; increased assist required since surgery last week, was supervision level for med mgt before  Comments: At baseline, patient mod indep  with RW for ADLs, household distances; since hip fracture, requiring assist from family for all transfers, functional activities.  Generally limited by pain.     Hand Dominance   Dominant Hand: Right    Extremity/Trunk Assessment   Upper Extremity Assessment Upper Extremity Assessment: Generalized weakness(grossly 3-/5 throughout; no obvious focal weakness appreciated)    Lower Extremity Assessment Lower Extremity Assessment:  Generalized weakness(L hip grossly 2+/5, limited by pain; otherwise, LEs at least 3-/5.   No obvious focal deficits.  Assessment limited by lethargy) RLE Deficits / Details: grossly at least 4-/5, sensation appears intact, difficulty with ankle ROM, decr DF LLE Deficits / Details: grossly at least 3+/5 except hip flexion limited by pain from recent L hip ORIF, sensation appears intact, difficulty with ankle ROM, decr DF LLE: Unable to fully assess due to pain       Communication   Communication: HOH  Cognition Arousal/Alertness: Lethargic Behavior During Therapy: Flat affect Overall Cognitive Status: Impaired/Different from baseline Area of Impairment: Orientation;Following commands;Safety/judgement;Awareness;Problem solving                 Orientation Level: Disoriented to;Place;Time;Situation     Following Commands: Follows one step commands inconsistently Safety/Judgement: Decreased awareness of deficits Awareness: Intellectual Problem Solving: Slow processing;Difficulty sequencing;Requires verbal cues General Comments: Orientation to self; cognitive assessment further limited by lethargy      General Comments General comments (skin integrity, edema, etc.): deferred secondary to lethargy    Exercises Other Exercises Other Exercises: Supine LE therex, 1x5 L LE; 1x10, R LE. Act assist required throughout, L LE limited due to pain. Other Exercises: BLE ther-ex including heel slides, hip abd/adduction, supported knee flex/ext in preparation for bed mobility for ADL   Assessment/Plan    PT Assessment Patient needs continued PT services  PT Problem List Decreased strength;Decreased balance;Pain;Decreased mobility;Decreased knowledge of use of DME;Cardiopulmonary status limiting activity;Decreased activity tolerance;Decreased safety awareness;Decreased skin integrity       PT Treatment Interventions DME instruction;Functional mobility training;Balance training;Patient/family  education;Gait training;Therapeutic activities;Neuromuscular re-education;Therapeutic exercise;Stair training    PT Goals (Current goals can be found in the Care Plan section)  Acute Rehab PT Goals Patient Stated Goal: to improve pain and decrease confusion PT Goal Formulation: With patient/family Time For Goal Achievement: 06/04/18 Potential to Achieve Goals: Fair Additional Goals Additional Goal #1: Assess and establish goals for OOB activities as appropriate.    Frequency Min 2X/week(will increase frequency to QD as patient able to tolerate increase in intensity)   Barriers to discharge Decreased caregiver support;Inaccessible home environment      Co-evaluation               AM-PAC PT "6 Clicks" Mobility  Outcome Measure Help needed turning from your back to your side while in a flat bed without using bedrails?: Total Help needed moving from lying on your back to sitting on the side of a flat bed without using bedrails?: Total Help needed moving to and from a bed to a chair (including a wheelchair)?: Total Help needed standing up from a chair using your arms (e.g., wheelchair or bedside chair)?: Total Help needed to walk in hospital room?: Total Help needed climbing 3-5 steps with a railing? : Total 6 Click Score: 6    End of Session Equipment Utilized During Treatment: Oxygen Activity Tolerance: Patient limited by fatigue;Patient limited by pain Patient left: in bed;with call bell/phone within reach;with bed alarm set;with family/visitor present Nurse Communication: Mobility status PT Visit Diagnosis: Muscle weakness (generalized) (M62.81);Unsteadiness  on feet (R26.81);Difficulty in walking, not elsewhere classified (R26.2);Pain Pain - Right/Left: Left Pain - part of body: Hip    Time: 4707-6151 PT Time Calculation (min) (ACUTE ONLY): 13 min   Charges:   PT Evaluation $PT Eval Moderate Complexity: 1 Mod         Tobenna Needs H. Owens Shark, PT, DPT, NCS 05/21/18,  11:25 AM [phone]

## 2018-05-21 NOTE — Consult Note (Signed)
    Christine [name] October 28, 1920 [id number]  CC: Pelvic pressure  I was asked to see Christine [name] by Dr. Brigitte Pulse for pelvic pressure and urinary retention.  Briefly, she is a 85 year old very frail appearing female who is postop day 6 from orthopedic repair of a hip fracture, as well as re-admission with dysuria and found of had a recent stroke.  Her urinalysis yesterday is completely negative with no RBCs or WBCs, and urine culture showed no growth.  She was complaining of bladder pain and pressure, and bladder scan today showed greater than 460 cc, and a Foley catheter was placed with return of yellow urine.  This is improved her pelvic pressure, she continues to have left hip pain in the setting of recent surgery.  I had a long discussion with the patients daughter about her symptoms.  I highly suspect she was having incomplete emptying and retention which caused her pelvic pressure and dysuria.  Her confusion is likely multifactorial in the setting of recent orthopedic surgery, multiple medications, and stroke.  -Recommend Foley 7 to 10 days, follow-up arranged for removal and voiding trial in urology clinic -No role for further imaging from a urologic perspective -Call if questions  Christine [name], [place] [address], [place], [place] [address] [phone]

## 2018-05-21 NOTE — Progress Notes (Signed)
SLP Cancellation Note  Patient Details Name: Christine [name] MRN: [id number] DOB: 07-10-1920   Cancelled treatment:       Reason Eval/Treat Not Completed: Medical issues which prohibited therapy;Pain limiting ability to participate;Fatigue/lethargy limiting ability to participate(chart reviewed; consulted NSG re: pt's status).  NSG reported pt is c/o more pain now and is requesting pain medications; MD contacted for such. Pt has been admitted for a stroke; family endorsed confusion and expressive language deficits as well. Pt requires cues at times to follow simple commands and additional time to perform. She is HOH baseline.  ST services will hold on the Cognitive-Language evaluation while pt is in pain and receiving pain medication. Will f/u tomorrow for assessment. Encouraged NSG to follow general aspiration precautions w/ any oral intake and give Pills w/ Puree for safer swallowing at this time. NSG agreed.     Orinda Kenner, MS, CCC-SLP Watson,Katherine 05/21/2018, 12:33 PM

## 2018-05-21 NOTE — Evaluation (Signed)
Occupational Therapy Evaluation Patient Details Name: Christine [name] MRN: [id number] DOB: [birth year] Today's Date: 05/21/2018    History of Present Illness 85 yo female admitted to ED on 05/20/2018 with confusion and R sided weakness. Recently admitted with onset of fall resulted in L hip intertrochanteric fracture and ORIF on 05/15/2018, LLE WBAT. 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 Since recent hospital admission pt has been primarily in lift recliner requiring assist from chair/family for transfers. Home health was scheduled to start the date pt was admitted to the ED. Pt required increased assist from family for ADL tasks 2/2 recent L hip fx s/p ORIF last week. Pt lives with her daughter on weekends and her granddaughter during the week in a 1 story home with 2 steps to enter. Currently pt demonstrates impairments in cognition including language, making assessment of other deficits more challenging. Pt appears slightly confused at times with word finding difficulty but denies deficits with speech when asked in the moment. Pt also very HOH. Pt able to move all limbs against gravity; difficult to assess more nuanced focal weakness, but pt at minimum generally weak all over. BUE coordination slow and pt reports it "feels hard" to perform finger to nose and thumb opposition. Pt with decreased ankle ROM including impaired DF bilaterally. Sensation and vision appears to be intact, but again this assessment is somewhat limited by cognition and HOH. Family endorses confusion and expressive language deficits as well. Pt requires cues at times to follow simple commands and additional time to perform. Pt pain limited in L hip. Able to perform BLE there-ex in preparation for bed mobility for ADL, but ultimately too pain limited to attempt bed mobility. RN notified. These noted  impairments result in increased need for assist for all aspects of ADL and functional mobility. Pt will benefit from skilled OT services to maximize return to PLOF, minimize risk of future falls, and minimize caregiver burden and risk of hospital readmission. Recommend STR at this time.     Follow Up Recommendations  SNF    Equipment Recommendations  (TBD)    Recommendations for Other Services       Precautions / Restrictions Precautions Precautions: Fall Precaution Comments: very HOH Restrictions Weight Bearing Restrictions: Yes LLE Weight Bearing: Weight bearing as tolerated      Mobility Bed Mobility               General bed mobility comments: pain limited, will continue to assess  Transfers                 General transfer comment: pain limited, will continue to assess    Balance                                           ADL either performed or assessed with clinical judgement   ADL Overall ADL's : Needs assistance/impaired Eating/Feeding: Bed level;With caregiver independent assisting                                     General ADL Comments: limited ADL assessment 2/2 L hip pain; pt current max A at bed level for bathing, dressing, and toileting  Vision Patient Visual Report: No change from baseline Additional Comments: difficult to assess 2/2 cognition/HOH, will continue to assess     Perception     Praxis      Pertinent Vitals/Pain Pain Assessment: Faces(Pt reports L hip "hurts a lot") Faces Pain Scale: Hurts whole lot Pain Location: L hip and L lateral thigh Pain Descriptors / Indicators: Grimacing;Guarding;Sharp Pain Intervention(s): Limited activity within patient's tolerance;Monitored during session     Hand Dominance Right   Extremity/Trunk Assessment Upper Extremity Assessment Upper Extremity Assessment: Generalized weakness;Difficult to assess due to impaired cognition(difficult to assess  shoulder strength 2/2 cognition/HOH; elbow flex/ext at least 4-/5, grip at least 4-/5; sensation appears intact; coordination slow and "feels hard" on both sides, per pt)   Lower Extremity Assessment Lower Extremity Assessment: RLE deficits/detail;LLE deficits/detail;Difficult to assess due to impaired cognition RLE Deficits / Details: grossly at least 4-/5, sensation appears intact, difficulty with ankle ROM, decr DF LLE Deficits / Details: grossly at least 3+/5 except hip flexion limited by pain from recent L hip ORIF, sensation appears intact, difficulty with ankle ROM, decr DF LLE: Unable to fully assess due to pain       Communication Communication Communication: HOH   Cognition Arousal/Alertness: Awake/alert Behavior During Therapy: WFL for tasks assessed/performed Overall Cognitive Status: Impaired/Different from baseline Area of Impairment: Orientation;Following commands;Safety/judgement;Awareness;Problem solving                 Orientation Level: Disoriented to;Place;Time;Situation     Following Commands: Follows one step commands inconsistently Safety/Judgement: Decreased awareness of deficits Awareness: Intellectual Problem Solving: Slow processing;Difficulty sequencing;Requires verbal cues     General Comments       Exercises Other Exercises Other Exercises: BLE ther-ex including heel slides, hip abd/adduction, supported knee flex/ext in preparation for bed mobility for ADL   Shoulder Instructions      Home Living Family/patient expects to be discharged to:: Private residence Living Arrangements: Other relatives(lives w/ granddaughter during the week, daughter on weekends) Available Help at Discharge: Family;Available 24 hours/day Type of Home: House Home Access: Stairs to enter [employer] of Steps: 2 Entrance Stairs-Rails: Right Home Layout: One level     Bathroom Shower/Tub: Teacher, early years/pre: Standard(has BSC frame  overtop)     Home Equipment: Bedside commode;Shower seat;Walker - 4 wheels;Cane - single point;Wheelchair - Copy)          Prior Functioning/Environment Level of Independence: Needs assistance  Gait / Transfers Assistance Needed: RW with no assist in the house prior to recent fall/L hip fx; since has been in lift recliner and relying on lift to help her stand in addition to family assist; no other falls besides fall that led to L hip fx ADL's / Homemaking Assistance Needed: family assists her with dressing and bathing; increased assist required since surgery last week, was supervision level for med mgt before Communication / Swallowing Assistance Needed: very HOH          OT Problem List: Decreased strength;Decreased range of motion;Decreased coordination;Decreased knowledge of use of DME or AE;Impaired UE functional use;Decreased cognition;Decreased activity tolerance;Pain;Decreased safety awareness;Impaired balance (sitting and/or standing)      OT Treatment/Interventions: Self-care/ADL training;Balance training;Therapeutic exercise;Therapeutic activities;Neuromuscular education;DME and/or AE instruction;Cognitive remediation/compensation;Patient/family education    OT Goals(Current goals can be found in the care plan section) Acute Rehab OT Goals Patient Stated Goal: to have less pain and be able to return to PLOF OT Goal Formulation: With patient/family Time For Goal Achievement: 06/04/18  Potential to Achieve Goals: Good ADL Goals Pt Will Perform Eating: sitting;with supervision;with set-up(adhering to any swallow precautions, no s/s aspiration) Pt Will Perform Grooming: sitting;with min guard assist Pt Will Transfer to Toilet: with mod assist;bedside commode;stand pivot transfer  OT Frequency: Min 2X/week   Barriers to D/C:            Co-evaluation              AM-PAC OT "6 Clicks" Daily Activity     Outcome Measure Help from  another person eating meals?: A Little Help from another person taking care of personal grooming?: A Little Help from another person toileting, which includes using toliet, bedpan, or urinal?: A Lot Help from another person bathing (including washing, rinsing, drying)?: A Lot Help from another person to put on and taking off regular upper body clothing?: A Lot Help from another person to put on and taking off regular lower body clothing?: A Lot 6 Click Score: 14   End of Session    Activity Tolerance: Patient limited by pain Patient left: in bed;with call bell/phone within reach;with bed alarm set;with family/visitor present;Other (comment)(lab tech present for lab draws)  OT Visit Diagnosis: Other abnormalities of gait and mobility (R26.89);Muscle weakness (generalized) (M62.81);History of falling (Z91.81);Pain;Cognitive communication deficit (R41.841);Other symptoms and signs involving cognitive function Symptoms and signs involving cognitive functions: Cerebral infarction Pain - Right/Left: Left Pain - part of body: Hip;Leg                Time: 2751-7001 OT Time Calculation (min): 22 min Charges:  OT General Charges $OT Visit: 1 Visit OT Evaluation $OT Eval Moderate Complexity: 1 Mod OT Treatments $Therapeutic Exercise: 8-22 mins  Jeni Salles, MPH, MS, OTR/L ascom [phone] 05/21/18, 10:03 AM

## 2018-05-21 NOTE — Consult Note (Signed)
ORTHOPAEDIC CONSULTATION  REQUESTING PHYSICIAN: Max Sane, MD  Chief Complaint:   Status post IM nailing of left intertrochanteric hip fracture.  History of Present Illness: Christine [name] is a 85 y.o. female who is now 6 days status post an intramedullary nailing of a displaced intertrochanteric fracture of her left hip.  The patient did well postoperatively, other than requiring the transfusion of a unit of blood for a low blood count postoperatively.  She was discharged home on the third postoperative day and appeared to be doing well with her family.  Two days later, the patient was noted to be confused and complained of burning with urination, so the patient was brought to the emergency room for evaluation and treatment of a presumed UTI.  However, upon arrival in the emergency room, her urinalysis was negative but she did have evidence of a recent stroke, so the patient was admitted for further evaluation and treatment.  I have been asked to assess the patient's wounds to be sure that her postoperative course continues to be progressing appropriately.  Past Medical History:  Diagnosis Date  . Cancer ([place])    colon  . Carpal tunnel syndrome   . Complication of anesthesia    hard time waking up after thyroidectomy  . DJD (degenerative joint disease)   . Family history of adverse reaction to anesthesia    daughter PONV  . Hearing loss   . Hypothyroidism   . Osteoporosis   . Wears glasses    Past Surgical History:  Procedure Laterality Date  . ABDOMINAL HYSTERECTOMY    . COLON RESECTION  1995   for colon cancer  . EYE SURGERY Bilateral    cataract surgery  . INTRAMEDULLARY (IM) NAIL INTERTROCHANTERIC Right 01/09/2015   Procedure: INTRAMEDULLARY (IM) NAIL INTERTROCHANTRIC;  Surgeon: Corky Mull, MD;  Location: [facility] ORS;  Service: Orthopedics;  Laterality: Right;  . INTRAMEDULLARY (IM) NAIL INTERTROCHANTERIC Left  05/15/2018   Procedure: INTRAMEDULLARY (IM) NAIL INTERTROCHANTRIC - LEFT FEMUR;  Surgeon: Corky Mull, MD;  Location: [facility] ORS;  Service: Orthopedics;  Laterality: Left;  . Lung surgery     as a child for empyema  . OOPHORECTOMY    . THYROID SURGERY  1967   for multinodular goitre  . TOTAL HIP REVISION Right 10/24/2016   Procedure: TOTAL HIP REVISION AND HARDWARE REMOVAL;  Surgeon: Corky Mull, MD;  Location: [facility] ORS;  Service: Orthopedics;  Laterality: Right;   Social History   Socioeconomic History  . Marital status: Single    Spouse name: Not on file  . Number of children: Not on file  . Years of education: Not on file  . Highest education level: Not on file  Occupational History  . Not on file  Social Needs  . Financial resource strain: Not on file  . Food insecurity:    Worry: Not on file    Inability: Not on file  . Transportation needs:    Medical: Not on file    Non-medical: Not on file  Tobacco Use  . Smoking status: Never Smoker  . Smokeless tobacco: Never Used  Substance and Sexual Activity  . Alcohol use: No  . Drug use: No  . Sexual activity: Not Currently  Lifestyle  . Physical activity:    Days per week: Not on file    Minutes per session: Not on file  . Stress: Not on file  Relationships  . Social connections:    Talks on phone: Not on file  Gets together: Not on file    Attends religious service: Not on file    Active member of club or organization: Not on file    Attends meetings of clubs or organizations: Not on file    Relationship status: Not on file  Other Topics Concern  . Not on file  Social History Narrative   Lives at home by herself   Family History  Problem Relation Age of Onset  . Heart disease Father   . Diabetes Sister   . Cancer Sister   . Diabetes Brother   . Cancer Brother   . Cancer Mother    Allergies  Allergen Reactions  . Adhesive [Tape] Other (See Comments)    Red itchy  . Tapentadol Other (See Comments)     Red itchy   Prior to Admission medications   Medication Sig Start Date End Date Taking? Authorizing Provider  acetaminophen (TYLENOL) 325 MG tablet Take 650 mg by mouth every 6 (six) hours as needed for pain. 01/13/15  Yes [provider]  calcium carbonate (OSCAL) 1500 (600 Ca) MG TABS tablet Take 600 mg by mouth 2 (two) times daily with a meal.   Yes [provider]  Cholecalciferol (KP VITAMIN D3) 2000 units CAPS Take 2,000 Units by mouth daily.    Yes [provider]  cholecalciferol (VITAMIN D) 1000 units tablet Take 1,000 Units by mouth every other day.   Yes [provider]  furosemide (LASIX) 20 MG tablet Take 20 mg by mouth as needed.   Yes [provider]  levothyroxine (SYNTHROID, LEVOTHROID) 112 MCG tablet Take 112 mcg by mouth daily. 10/14/17  Yes [provider]  Melatonin 10 MG TABS Take 10 mg by mouth at bedtime.   Yes [provider]  pantoprazole (PROTONIX) 40 MG tablet Take 40 mg by mouth 2 (two) times daily. 02/09/18  Yes [provider]  potassium chloride (K-DUR) 10 MEQ tablet Take 10 mEq by mouth as needed.   Yes [provider]  raloxifene (EVISTA) 60 MG tablet Take 60 mg by mouth daily.  09/21/16  Yes [provider]  traMADol (ULTRAM) 50 MG tablet Take 1 tablet (50 mg total) by mouth every 6 (six) hours as needed for up to 7 days for moderate pain. 05/18/18 05/25/18 Yes Max Sane, MD  docusate sodium (COLACE) 100 MG capsule Take 1 capsule (100 mg total) by mouth 2 (two) times daily. 05/18/18   Max Sane, MD  LUBRICANT EYE DROPS 0.4-0.3 % SOLN Place 1 drop into both eyes 3 (three) times daily as needed. For dry eyes. 07/19/16   [provider]  oxyCODONE (OXY IR/ROXICODONE) 5 MG immediate release tablet Take 1 tablet (5 mg total) by mouth every 12 (twelve) hours as needed for up to 3 days for moderate pain (pain score 4-6). 05/18/18 05/21/18  Max Sane, MD  polyethylene glycol (MIRALAX  / GLYCOLAX) packet Take 17 g by mouth daily as needed for mild constipation. 05/18/18   Max Sane, MD   Ct Head Wo Contrast  Result Date: 05/20/2018 CLINICAL DATA:  Altered mental status since last night. Recent fall. EXAM: CT HEAD WITHOUT CONTRAST TECHNIQUE: Contiguous axial images were obtained from the base of the skull through the vertex without intravenous contrast. COMPARISON:  MRI of the head December 26, 2011 FINDINGS: BRAIN: New LEFT thalamus 1 cm infarct. No intraparenchymal hemorrhage, mass effect nor midline shift. No parenchymal brain volume loss for age. No hydrocephalus. Patchy supratentorial white matter hypodensities  less than expected for patient's age, though non-specific are most compatible with chronic small vessel ischemic disease. No acute large vascular territory infarcts. No abnormal extra-axial fluid collections. Basal cisterns are patent. VASCULAR: Moderate to severe calcific atherosclerosis of the carotid siphons. SKULL: No skull fracture. No significant scalp soft tissue swelling. SINUSES/ORBITS: Trace paranasal sinus mucosal thickening. Mastoid air cells are well aerated.The included ocular globes and orbital contents are non-suspicious. Status post bilateral ocular lens implants. OTHER: None. IMPRESSION: 1. New age indeterminate LEFT thalamus small infarcts. 2. Otherwise similar examination given differences in imaging technique including mild chronic small vessel ischemic changes. Electronically Signed   By: Elon Alas M.D.   On: 05/20/2018 16:38   Mr Brain Wo Contrast  Result Date: 05/20/2018 CLINICAL DATA:  Altered mental status and dysarthria. EXAM: MRI HEAD WITHOUT CONTRAST MRA HEAD WITHOUT CONTRAST TECHNIQUE: Multiplanar, multiecho pulse sequences of the brain and surrounding structures were obtained without intravenous contrast. Angiographic images of the head were obtained using MRA technique without contrast. COMPARISON:  Head CT 05/20/2018 Brain MRI 12/26/2011  FINDINGS: MRI HEAD FINDINGS BRAIN: There is a focus of acute ischemia within the left paramedian thalamus. No other site of abnormal diffusion restriction. Unchanged size of intra sellar mass measuring 1.5 cm craniocaudal by 1.4 cm AP. There are no old infarcts. Early confluent hyperintense T2-weighted signal of the periventricular and deep white matter, most commonly due to chronic ischemic microangiopathy. The CSF spaces are normal for age, with no hydrocephalus. Susceptibility-sensitive sequences show no chronic microhemorrhage or superficial siderosis. SKULL AND UPPER CERVICAL SPINE: The visualized skull base, calvarium, upper cervical spine and extracranial soft tissues are normal. SINUSES/ORBITS: No fluid levels or advanced mucosal thickening. No mastoid or middle ear effusion. The orbits are normal. MRA HEAD FINDINGS The MRA is markedly degraded by motion. There is antegrade flow within both internal carotid arteries at the skull base. The origins of the anterior and middle cerebral arteries are patent. There is a P-comm present on the right. Both posterior cerebral arteries are patent proximally. There is multifocal narrowing of the basilar artery, which remains patent. It is unclear whether the narrowing is artifactual. IMPRESSION: 1. Acute infarct of the medial left thalamus. No hemorrhage or mass effect. 2. Severely motion degraded MRA without a visible proximal large vessel occlusion. Electronically Signed   By: Ulyses Jarred M.D.   On: 05/20/2018 23:18   US Carotid Bilateral (at [facility] And [facility] Only)  Result Date: 05/20/2018 CLINICAL DATA:  Slurred speech, confusion and right-sided weakness. EXAM: BILATERAL CAROTID DUPLEX ULTRASOUND TECHNIQUE: Pearline Cables scale imaging, color Doppler and duplex ultrasound were performed of bilateral carotid and vertebral arteries in the neck. COMPARISON:  None. FINDINGS: Criteria: Quantification of carotid stenosis is based on velocity parameters that correlate the residual  internal carotid diameter with NASCET-based stenosis levels, using the diameter of the distal internal carotid lumen as the denominator for stenosis measurement. The following velocity measurements were obtained: RIGHT ICA:  89/24 cm/sec CCA:  32/4 cm/sec SYSTOLIC ICA/CCA RATIO:  1.3 ECA:  67 cm/sec LEFT ICA:  65/14 cm/sec CCA:  40/1 cm/sec SYSTOLIC ICA/CCA RATIO:  0.9 ECA:  81 cm/sec RIGHT CAROTID ARTERY: There is a mild amount of partially calcified plaque at the level of the carotid bulb. No focal plaque in the ICA or evidence of ICA stenosis. RIGHT VERTEBRAL ARTERY: Antegrade flow with normal waveform and velocity. LEFT CAROTID ARTERY: Mild amount of mostly noncalcified plaque at the level of the carotid bulb. No evidence of ICA  plaque or stenosis. LEFT VERTEBRAL ARTERY: Antegrade flow with normal waveform and velocity. IMPRESSION: Mild plaque at the level of both carotid bulbs. No evidence of ICA plaque or stenosis bilaterally. Electronically Signed   By: Aletta Edouard M.D.   On: 05/20/2018 18:24   Mr Jodene Nam Head/brain TT Cm  Result Date: 05/20/2018 CLINICAL DATA:  Altered mental status and dysarthria. EXAM: MRI HEAD WITHOUT CONTRAST MRA HEAD WITHOUT CONTRAST TECHNIQUE: Multiplanar, multiecho pulse sequences of the brain and surrounding structures were obtained without intravenous contrast. Angiographic images of the head were obtained using MRA technique without contrast. COMPARISON:  Head CT 05/20/2018 Brain MRI 12/26/2011 FINDINGS: MRI HEAD FINDINGS BRAIN: There is a focus of acute ischemia within the left paramedian thalamus. No other site of abnormal diffusion restriction. Unchanged size of intra sellar mass measuring 1.5 cm craniocaudal by 1.4 cm AP. There are no old infarcts. Early confluent hyperintense T2-weighted signal of the periventricular and deep white matter, most commonly due to chronic ischemic microangiopathy. The CSF spaces are normal for age, with no hydrocephalus.  Susceptibility-sensitive sequences show no chronic microhemorrhage or superficial siderosis. SKULL AND UPPER CERVICAL SPINE: The visualized skull base, calvarium, upper cervical spine and extracranial soft tissues are normal. SINUSES/ORBITS: No fluid levels or advanced mucosal thickening. No mastoid or middle ear effusion. The orbits are normal. MRA HEAD FINDINGS The MRA is markedly degraded by motion. There is antegrade flow within both internal carotid arteries at the skull base. The origins of the anterior and middle cerebral arteries are patent. There is a P-comm present on the right. Both posterior cerebral arteries are patent proximally. There is multifocal narrowing of the basilar artery, which remains patent. It is unclear whether the narrowing is artifactual. IMPRESSION: 1. Acute infarct of the medial left thalamus. No hemorrhage or mass effect. 2. Severely motion degraded MRA without a visible proximal large vessel occlusion. Electronically Signed   By: Ulyses Jarred M.D.   On: 05/20/2018 23:18    Positive ROS: All other systems have been reviewed and were otherwise negative with the exception of those mentioned in the HPI and as above.  Physical Exam: General: Arousable, no acute distress Psychiatric:  Patient is not clearly competent for consent, but does exhibit normal mood and affect   Cardiovascular:  No pedal edema Respiratory:  No wheezing, non-labored breathing GI:  Abdomen is soft and non-tender Skin:  No lesions in the area of chief complaint Neurologic:  Sensation intact distally Lymphatic:  No axillary or cervical lymphadenopathy  Orthopedic Exam:  Orthopedic examination is limited to the left hip and lower extremity.  The proximal incision site appears to have moderate serous drainage, but no erythema or other evidence for infection.  There is an area of ecchymosis in the buttock region posterior to this more proximal incision.  The more distal incision sites appear to be  healing well and are without evidence for drainage or infection.  She is able to actively dorsiflex and plantarflex her toes and ankle.  Sensations intact light touch to all distributions.  She has good capillary refill to her left foot.  X-rays:  No new x-rays of the hip have been obtained.  Assessment: Status post intramedullary nailing of displaced intertrochanteric fracture left hip.  Plan: The treatment options were discussed with the patient and her daughter, who is at the bedside.  The patient may be mobilized with physical therapy, weightbearing as tolerated on the left lower extremity as symptoms permit.  The patient had been responding well  to tramadol in the past, so I feel that this would be a good medication for her as she appeared to tolerate it well and it did not appear to alter her mental status too much.  I will asked the nurses to change her dressings today.  Thank you for asked me to participate in the care of this most pleasant woman.  I will be happy to follow her with you.   Pascal Lux, MD  Beeper #:  [phone]  05/21/2018 12:19 PM

## 2018-05-21 NOTE — Progress Notes (Signed)
PMT consult received and chart reviewed. Discussed with Dr. Manuella Ghazi. Spoke with daughter, [name] via telephone. She plans to spend the night tonight and available anytime in AM to discuss [place]. PMT provider to meet with patient and daughter in AM. Thank you.  NO CHARGE  Ihor Dow, FNP-C Palliative Medicine Team  Phone: [phone] Fax: [phone]

## 2018-05-22 ENCOUNTER — Telehealth: Payer: Self-pay | Admitting: Urology

## 2018-05-22 DIAGNOSIS — Z515 Encounter for palliative care: Secondary | ICD-10-CM

## 2018-05-22 DIAGNOSIS — Z7189 Other specified counseling: Secondary | ICD-10-CM

## 2018-05-22 DIAGNOSIS — N3289 Other specified disorders of bladder: Secondary | ICD-10-CM

## 2018-05-22 LAB — RESPIRATORY PANEL BY PCR
ADENOVIRUS-RVPPCR: NOT DETECTED
Bordetella pertussis: NOT DETECTED
CORONAVIRUS NL63-RVPPCR: NOT DETECTED
Chlamydophila pneumoniae: NOT DETECTED
Coronavirus 229E: NOT DETECTED
Coronavirus HKU1: NOT DETECTED
Coronavirus OC43: NOT DETECTED
Influenza A: NOT DETECTED
Influenza B: NOT DETECTED
Metapneumovirus: NOT DETECTED
Mycoplasma pneumoniae: NOT DETECTED
Parainfluenza Virus 1: NOT DETECTED
Parainfluenza Virus 2: NOT DETECTED
Parainfluenza Virus 3: NOT DETECTED
Parainfluenza Virus 4: NOT DETECTED
Respiratory Syncytial Virus: NOT DETECTED
Rhinovirus / Enterovirus: NOT DETECTED

## 2018-05-22 LAB — CBC
HCT: 25 % — ABNORMAL LOW (ref 36.0–46.0)
Hemoglobin: 8.2 g/dL — ABNORMAL LOW (ref 12.0–15.0)
MCH: 32 pg (ref 26.0–34.0)
MCHC: 32.8 g/dL (ref 30.0–36.0)
MCV: 97.7 fL (ref 80.0–100.0)
Platelets: 270 K/uL (ref 150–400)
RBC: 2.56 MIL/uL — ABNORMAL LOW (ref 3.87–5.11)
RDW: 14.3 % (ref 11.5–15.5)
WBC: 10.7 K/uL — ABNORMAL HIGH (ref 4.0–10.5)
nRBC: 0 % (ref 0.0–0.2)

## 2018-05-22 LAB — BASIC METABOLIC PANEL
Anion gap: 7 (ref 5–15)
BUN: 13 mg/dL (ref 8–23)
CO2: 23 mmol/L (ref 22–32)
Calcium: 7.5 mg/dL — ABNORMAL LOW (ref 8.9–10.3)
Chloride: 104 mmol/L (ref 98–111)
Creatinine, Ser: 0.55 mg/dL (ref 0.44–1.00)
GFR calc Af Amer: >60 mL/min (ref >60)
GFR calc non Af Amer: >60 mL/min (ref >60)
Glucose, Bld: 102 mg/dL — ABNORMAL HIGH (ref 70–99)
Potassium: 3.5 mmol/L (ref 3.5–5.1)
SODIUM: 134 mmol/L — AB (ref 135–145)

## 2018-05-22 MED ORDER — ASPIRIN EC 81 MG PO TBEC: 81.0000 mg | DELAYED_RELEASE_TABLET | Freq: Every day | ORAL | Status: DC

## 2018-05-22 MED ORDER — AMLODIPINE BESYLATE 5 MG PO TABS: 5.0000 mg | ORAL_TABLET | Freq: Every day | ORAL | Status: DC

## 2018-05-22 MED ORDER — OXYBUTYNIN CHLORIDE 5 MG PO TABS
2.5000 mg | ORAL_TABLET | Freq: Three times a day (TID) | ORAL | Status: DC
  Administered 2018-05-22 – 2018-05-24 (×7): 2.5 mg via ORAL
  Filled 2018-05-22 (×7): qty 1

## 2018-05-22 MED ORDER — POTASSIUM CHLORIDE CRYS ER 20 MEQ PO TBCR
20.0000 meq | EXTENDED_RELEASE_TABLET | Freq: Once | ORAL | Status: AC
  Administered 2018-05-22: 20 meq via ORAL
  Filled 2018-05-22: qty 1

## 2018-05-22 MED ORDER — RIVAROXABAN 20 MG PO TABS
20.0000 mg | ORAL_TABLET | Freq: Every day | ORAL | Status: DC
  Administered 2018-05-22 – 2018-05-26 (×5): 20 mg via ORAL
  Filled 2018-05-22 (×5): qty 1

## 2018-05-22 MED ORDER — HYDROCHLOROTHIAZIDE 12.5 MG PO CAPS
12.5000 mg | ORAL_CAPSULE | Freq: Every day | ORAL | Status: DC
  Administered 2018-05-22 – 2018-05-23 (×2): 12.5 mg via ORAL
  Filled 2018-05-22 (×2): qty 1

## 2018-05-22 MED ORDER — PHENAZOPYRIDINE HCL 100 MG PO TABS
100.0000 mg | ORAL_TABLET | Freq: Three times a day (TID) | ORAL | Status: AC
  Administered 2018-05-22 – 2018-05-23 (×5): 100 mg via ORAL
  Filled 2018-05-22 (×6): qty 1

## 2018-05-22 NOTE — Progress Notes (Signed)
New referral for outpatient Palliative to follow at home received following a Palliative medicine consult. [place] aware. [facility] to have [facility]. Possible d/c over the weekend. Patient information faxed to referral. Flo Shanks BSN, RN, [facility] [phone]

## 2018-05-22 NOTE — Consult Note (Addendum)
Consultation Note Date: 05/22/2018   Patient Name: Christine [name]  DOB: November 01, 1920  MRN: [id number]  Age / Sex: 85 y.o., female  PCP: Glendon Axe, MD Referring Physician: Max Sane, MD  Reason for Consultation: Establishing goals of care  HPI/Patient Profile: 85 y.o. female  with past medical history of osteoporosis, DJD, hypothyroidism, colon cancer, left leg DVT, and recent fall with left hip fracture s/p repair admitted on 05/20/2018 with altered mental status. CT/MRI head revealed acute left thalamic CVA. Outside TPA window. Neurology following. PT/OT/SLP consulted and patient participating with therapy. Palliative medicine consultation for goals of care.   Clinical Assessment and Goals of Care:  I have reviewed medical records, discussed with care team,  assessed the patient and met with daughter [name]) at bedside to discuss diagnosis, [place], EOL wishes, disposition and options. Patient is awake, alert, oriented to person but pleasantly confused with baseline hearing impairment.  Introduced Palliative Medicine as specialized medical care for people living with serious illness. It focuses on providing relief from the symptoms and stress of a serious illness. The goal is to improve quality of life for both the patient and the family.   We discussed a brief life review of the patient. Prior to admission, patient living with granddaughter and stays with daughter on the weekends. Widowed x2. Prior to first hip fracture in 2016, patient was still independent and able to enjoy her love for gardening and staying "busy." [place] plans to do everything possible to keep her mother out of a nursing home.   Discussed events leading up to admission and course of hospitalization including diagnoses and interventions. [name] feels her mother has shown improvement since Wednesday when she was confused, not recognizing family,  and poor communicating. She was unable to work with physical therapy yesterday because morphine was given prior to this and patient was drowsy. [name] is hopeful her mother will work with therapy today and plans to take her back home with support of home health services.   I attempted to elicit values and goals of care important to the patient and daughter. Advanced directives, concepts specific to code status, artifical feeding and hydration, and rehospitalization were considered and discussed. Introduced and discussed MOST form. [name] is ready to complete today. She becomes tearful during the conversation and states "she has lived a good life." [name] further clarifies if critically ill, she would NOT want heroic measures for her mother including DNR/DNI or feeding tube. MOST form: DNR/DNI, limited interventions, IVF/ABX if indicated, and NO feeding tube. Durable DNR completed. Copies made for daughter.   Hospice and Palliative Care services outpatient were explained and offered. Again, daughter is hopeful her mother will be able to work with PT/OT at home. She is agreeable to outpatient palliative services.   Questions and concerns were addressed.  Hard Choices booklet left for review. PMT contact information given.    SUMMARY OF RECOMMENDATIONS    Initial GOC discussion with daughter, [name] at bedside.   MOST form completed: DNR/DNI, limited interventions including re-hospitalization  if necessary, IVF/ABX if indicated, and NO feeding tube. [name] shares that her mother has lived a good life and she would NOT wish for prolonged, heroic interventions if her mother's health further declined.   Daughter hopeful for progression with home health PT/OT. Patient has 24/7 support from daughter and granddaughter.   Daughter agreeable for outpatient palliative referral. Notified MD and RN CM.   Code Status/Advance Care Planning:  DNR  Symptom Management:   Oxybutynin 2.95m PO TID (Discussed with  attending).   Palliative Prophylaxis:   Aspiration, Bowel Regimen, Delirium Protocol, Frequent Pain Assessment, Oral Care and Turn Reposition  Psycho-social/Spiritual:   Desire for further Chaplaincy support: yes  Additional Recommendations: Caregiving  Support/Resources, Compassionate Wean Education and Education on Hospice  Prognosis:   Unable to determine  Discharge Planning: Home with Palliative Services      Primary Diagnoses: Present on Admission: . [place]   I have reviewed the medical record, interviewed the patient and family, and examined the patient. The following aspects are pertinent.  Past Medical History:  Diagnosis Date  . [place]    colon  . Carpal tunnel syndrome   . Complication of anesthesia    hard time waking up after thyroidectomy  . DJD (degenerative joint disease)   . Family history of adverse reaction to anesthesia    daughter PONV  . Hearing loss   . Hypothyroidism   . Osteoporosis   . Wears glasses    Social History   Socioeconomic History  . Marital status: Single    Spouse name: Not on file  . Number of children: Not on file  . Years of education: Not on file  . Highest education level: Not on file  Occupational History  . Not on file  Social Needs  . Financial resource strain: Not on file  . Food insecurity:    Worry: Not on file    Inability: Not on file  . Transportation needs:    Medical: Not on file    Non-medical: Not on file  Tobacco Use  . Smoking status: Never Smoker  . Smokeless tobacco: Never Used  Substance and Sexual Activity  . Alcohol use: No  . Drug use: No  . Sexual activity: Not Currently  Lifestyle  . Physical activity:    Days per week: Not on file    Minutes per session: Not on file  . Stress: Not on file  Relationships  . Social connections:    Talks on phone: Not on file    Gets together: Not on file    Attends religious service: Not on file    Active  member of club or organization: Not on file    Attends meetings of clubs or organizations: Not on file    Relationship status: Not on file  Other Topics Concern  . Not on file  Social History Narrative   Lives at home by herself   Family History  Problem Relation Age of Onset  . Heart disease Father   . Diabetes Sister   . Cancer Sister   . Diabetes Brother   . Cancer Brother   . Cancer Mother    Scheduled Meds: . atorvastatin  40 mg Oral q1800  . docusate sodium  100 mg Oral BID  . hydrochlorothiazide  12.5 mg Oral Daily  . levothyroxine  112 mcg Oral Q0600  . Melatonin  10 mg Oral QHS  . pantoprazole  40 mg Oral BID  .  phenazopyridine  100 mg Oral TID WC  . rivaroxaban  20 mg Oral Daily   Continuous Infusions: . sodium chloride 50 mL/hr at 05/21/18 1955   PRN Meds:.acetaminophen **OR** acetaminophen (TYLENOL) oral liquid 160 mg/5 mL **OR** acetaminophen, hydrALAZINE, traMADol Medications Prior to Admission:  Prior to Admission medications   Medication Sig Start Date End Date Taking? Authorizing Provider  acetaminophen (TYLENOL) 325 MG tablet Take 650 mg by mouth every 6 (six) hours as needed for pain. 01/13/15  Yes [provider]  calcium carbonate (OSCAL) 1500 (600 Ca) MG TABS tablet Take 600 mg by mouth 2 (two) times daily with a meal.   Yes [provider]  Cholecalciferol (KP VITAMIN D3) 2000 units CAPS Take 2,000 Units by mouth daily.    Yes [provider]  cholecalciferol (VITAMIN D) 1000 units tablet Take 1,000 Units by mouth every other day.   Yes [provider]  furosemide (LASIX) 20 MG tablet Take 20 mg by mouth as needed.   Yes [provider]  levothyroxine (SYNTHROID, LEVOTHROID) 112 MCG tablet Take 112 mcg by mouth daily. 10/14/17  Yes [provider]  Melatonin 10 MG TABS Take 10 mg by mouth at bedtime.   Yes [provider]  pantoprazole (PROTONIX) 40 MG tablet Take 40 mg by mouth 2 (two) times  daily. 02/09/18  Yes [provider]  potassium chloride (K-DUR) 10 MEQ tablet Take 10 mEq by mouth as needed.   Yes [provider]  raloxifene (EVISTA) 60 MG tablet Take 60 mg by mouth daily.  09/21/16  Yes [provider]  traMADol (ULTRAM) 50 MG tablet Take 1 tablet (50 mg total) by mouth every 6 (six) hours as needed for up to 7 days for moderate pain. 05/18/18 05/25/18 Yes Max Sane, MD  docusate sodium (COLACE) 100 MG capsule Take 1 capsule (100 mg total) by mouth 2 (two) times daily. 05/18/18   Max Sane, MD  LUBRICANT EYE DROPS 0.4-0.3 % SOLN Place 1 drop into both eyes 3 (three) times daily as needed. For dry eyes. 07/19/16   [provider]  polyethylene glycol (MIRALAX / GLYCOLAX) packet Take 17 g by mouth daily as needed for mild constipation. 05/18/18   Max Sane, MD   Allergies  Allergen Reactions  . Adhesive [Tape] Other (See Comments)    Red itchy  . Tapentadol Other (See Comments)    Red itchy   Review of Systems  Unable to perform ROS: Mental status change   Physical Exam Vitals signs and nursing note reviewed.  Constitutional:      General: She is awake.     Appearance: She is ill-appearing.  HENT:     Head: Normocephalic and atraumatic.  Pulmonary:     Effort: No tachypnea, accessory muscle usage or respiratory distress.  Genitourinary:    Comments: Pelvic discomfort/bladder spasms Skin:    General: Skin is warm and dry.     Findings: Ecchymosis present.  Neurological:     Mental Status: She is alert.     Comments: Oriented to person. Pleasantly confused  Psychiatric:        Attention and Perception: She is inattentive.        Speech: Speech is delayed.     Comments: HOH    Vital Signs: BP (!) 158/100 (BP Location: Right Arm)   Pulse 85   Temp 98.1 F (36.7 C)   Resp (!) 22   Ht 5' 7"  (1.702 m)   Wt 59  kg   SpO2 97%   BMI 20.37 kg/m  Pain Scale: PAINAD   Pain Score: Asleep   SpO2: SpO2: 97 % O2 Device:SpO2: 97  % O2 Flow Rate: .O2 Flow Rate (L/min): 0 L/min  IO: Intake/output summary:   Intake/Output Summary (Last 24 hours) at 05/22/2018 1203 Last data filed at 05/22/2018 0940 Gross per 24 hour  Intake 1393.29 ml  Output 2325 ml  Net -931.71 ml    LBM: Last BM Date: 05/22/18 Baseline Weight: Weight: 59 kg Most recent weight: Weight: 59 kg     Palliative Assessment/Data: PPS 40%   Flowsheet Rows     Most Recent Value  Intake Tab  Referral Department  Hospitalist  Unit at Time of Referral  Med/Surg Unit  Palliative Care Primary Diagnosis  Neurology  Date Notified  05/21/18  Palliative Care Type  New Palliative care  Reason for referral  Clarify Goals of Care  Date of Admission  05/20/18  Date first seen by Palliative Care  05/22/18  # of days Palliative referral response time  1 Day(s)  # of days IP prior to Palliative referral  1  Clinical Assessment  Palliative Performance Scale Score  40%  Psychosocial & Spiritual Assessment  Palliative Care Outcomes  Patient/Family meeting held?  Yes  Who was at the meeting?  daughter  Palliative Care Outcomes  Clarified goals of care, Provided psychosocial or spiritual support, Provided end of life care assistance, Linked to palliative care logitudinal support, Provided advance care planning, Completed durable DNR      Time In: 0845 Time Out: 0955 Time Total: 73mn Greater than 50%  of this time was spent counseling and coordinating care related to the above assessment and plan.  Signed by:  MIhor Dow FNP-C Palliative Medicine Team  Phone: [phone]: [phone]  Please contact Palliative Medicine Team phone at [phone] questions and concerns.  For individual provider: See AShea Evans

## 2018-05-22 NOTE — Progress Notes (Signed)
SLP Cancellation Note  Patient Details Name: Christine [name] MRN: [id number] DOB: [birth year]   Cancelled treatment:       Reason Eval/Treat Not Completed: SLP screened, no needs identified, will sign off  Spoke with patient and daughter.  Both report resolution of altered mental status and slurred speech.  The patient is demonstrating mild slurring secondary xerostomia and effects of pain medication.  Both counseled to seek speech therapy referral if speech is not at baseline upon return home.  Leroy Sea, MS/CCC- SLP  Lou Miner 05/22/2018, 2:58 PM

## 2018-05-22 NOTE — Care Management (Signed)
3/6 open with [facility] from last week's admission, Hospital bed in place, [place] with [facility] is aware of the admission, Patient lives with grand daughter that is a Marine scientist with [employer], Daughter also helps and is a Quarry manager.

## 2018-05-22 NOTE — Progress Notes (Addendum)
[place] [facility] [place] NAME: Christine [name]    MR#:  [id number]  DATE OF BIRTH:  1921-01-07  SUBJECTIVE:  CHIEF COMPLAINT:   Chief Complaint  Patient presents with  . Altered Mental Status   In bed eating breakfast daughter and cousin at bedside. No pain in the L hip at rest, has not been up today.  Continues to have pelvic spasms (pain/pressure), foley in. Yesterday bladder scan today showed greater than 460 cc.   Patient family hope to get her home with [facility] PT/OT. Has a 24/7 caregiver does not want SNF. Met with palliative care, okay with outpatient palliative care.  REVIEW OF SYSTEMS:  Review of Systems  Constitutional: Positive for malaise/fatigue. Negative for chills and fever.  HENT: Positive for hearing loss. Negative for congestion, ear pain, sinus pain and sore throat.   Eyes: Negative for blurred vision and pain.  Respiratory: Negative for cough, shortness of breath and wheezing.   Cardiovascular: Negative for chest pain, palpitations, orthopnea and leg swelling.  Gastrointestinal: Positive for abdominal pain (suprapubic). Negative for constipation, diarrhea, nausea and vomiting.  Genitourinary: Positive for urgency ("bladder spasm and pressure"). Negative for dysuria, flank pain and hematuria.  Musculoskeletal: Positive for joint pain. Negative for myalgias.  Skin: Negative for itching and rash.  Neurological: Negative for dizziness, sensory change, speech change, focal weakness, seizures, weakness and headaches.  Psychiatric/Behavioral: Negative for depression. The patient is not nervous/anxious.   DRUG ALLERGIES:   Allergies  Allergen Reactions  . Adhesive [Tape] Other (See Comments)    Red itchy  . Tapentadol Other (See Comments)    Red itchy   VITALS:  Blood pressure (!) 159/62, pulse 78, temperature 98.1 F (36.7 C), resp. rate (!) 22, height _0  (1.702 m), weight 59 kg, SpO2 97 %. PHYSICAL EXAMINATION:    GENERAL:85 y.o.-year-old patient lying in the bed with no acute distress. EYES: Pupils equal, round, reactive to light and accommodation. No scleral icterus. Extraocular muscles intact.  HEENT: Head atraumatic, normocephalic. Oropharynx and nasopharynx clear. No oropharyngeal erythema, moist oral mucosa  NECK: Supple, no jugular venous distention. No thyroid enlargement, no tenderness.  LUNGS: Normal breath sounds bilaterally anteriorly, exam limited by patient's unwillingness to turn over or sit up in bed, no wheezing, rales, rhonchi. No use of accessory muscles of respiration.  CARDIOVASCULAR: S1, S2 normal. No murmurs, rubs, or gallops.  ABDOMEN: Tender to palpation at the suprapubic region. Soft, nondistended. Bowel sounds present. No organomegaly or mass. Unable to assess CVA tenderness, she was not willing to sit up.  EXTREMITIES: L hip with proximal and distal incision site dressings, proximal dressing lightly saturated with serosanguinous drainage. Area of ecchymosis present posterior. No pedal edema, cyanosis, or clubbing. + 2 pedal &radial pulses b/l.  NEUROLOGIC: Cranial nerves II through XII are intact.No dysarthria noted. Following instructions without difficulty. Difficulty moving her left lower extremity due to pain from recent hip fracture. PSYCHIATRIC: The patient is alert andawake.Pleasantly confused, at baseline. SKIN: No obvious rash, lesion, or ulcer.  GU: indwelling catheter  LABORATORY PANEL:  Female CBC Recent Labs  Lab 05/22/18 0436  WBC 10.7*  HGB 8.2*  HCT 25.0*  PLT 270   ------------------------------------------------------------------------------------------------------------------ Chemistries  Recent Labs  Lab 05/16/18 0658  05/22/18 0436  NA 134*   < > 134*  K 4.4   < > 3.5  CL 101   < > 104  CO2 26   < > 23  GLUCOSE 120*   < >  102*  BUN 20   < > 13  CREATININE 0.88   < > 0.55  CALCIUM 7.4*   < > 7.5*  MG 1.9  --   --    < > = values  in this interval not displayed.   RADIOLOGY:  No results found. ASSESSMENT AND PLAN:   [place] a26 y.o.femalewith a known history of recent hip fracture, hypothyroidism presents to the emergency room due to acute onset of dysarthria, confusion. Patient was last known well at 2:15 AM 05/19/2018 when she spoke with her granddaughter and was oriented and clear with her speech. Had dysarthria on presentation. CT scan of the head showed left thalamic CVA. Urinalysis normal.  Patient was recently in the hospital for hip fracture and her granddaughter mentions she barely took 3 steps and was discharged home with home health. Home health was supposed to come in 05/21/2018 but patient ended up in the hospital. No confusion at home prior to this episode.  *Acute left thalamic CVA with dysarthria, right-sided weakness and confusion. Resolving symptomatically. -Consulted neurology, seen by NP Ouma -Checked MRI of the brain, Carotid dopplers, Echo, per neuro review: "MRI of the brain reviewed and shows acute infarct of the medial left thalamus. Etiology likely small vessel disease in the setting of elevated blood pressure on presentation. MRA head shows no large vessel occlusion or significant stenosis. Ultrasound carotid bilateral shows no evidence of hemodynamically significant stenosis.Echocardiogram did not show cardiac source of emboli EF 45 to 50%.HgbA1c5.3, LDL 29." - Started aspirin initially. Then discovered in recent discharge summary that she was just recently taking Xarelto. Stopped Aspirin.Started statin. - Xarelto for DVT prophylaxis, discontinued Lovenox.  - PT/OT/Speech consults - Neuro checks every 4 hours for the first 24 hours. - was not in the TPA window  *Hypertension [Initially problem list read: Elevated blood pressure without diagnosis of hypertension. Likely due to acute CVA. No history of hypertension. Permissive hypertension protocol discontinued now with  hydralazine to keep SBP <140 per neurology.]  On review of her most recent discharge summary she does take furosemide and HCTZ. Restarted HCTZ. Continue hydralazine PRN.   *Recent hip fracture. POD # 7, L hip intramedullary fixation. Continue physical therapy. Consulted orthopedic surgery, Dr. Roland Rack. WBAT.  Tramadol and acetaminophen for pain. I discontinued morphine at her daughter's request. She does not want her to be sedated. Can reorder if needed for severe pain. Dressing changes per RN.   *Suprapubic pain *Urinary retention Negative UA with negative urine culture. Describing "spasm" like pain which is recurrent. Consult placed to urology at daughter's request for evaluation. Bladder analgesics and anti-spasmodic (anti-cholinergic) agents deferred given her overall condition and advanced age 51/07/2018.   Urology recommended foley 7-10 days. No further workup. Follow-up at discharge for voiding trial.  05/22/2018 she continued to request something additional, palliative care NP Megan ordered oxybutynin. Hold if delirious. Family requested removing foley, counseled against this given her retention. Foley remains.   *DVT prophylaxis/history of DVT Lovenox was ordered on admission. Previously she was discharged med rec shows she was on Xarelto for DVT history. Restarted Xarelto discontinued Lovenox.   Discharge needs: [facility] PT/OT, outpatient palliative care  All the records are reviewed and case is discussed with Care Management/Social Worker. Management plans discussed with the patient and/or family and they are in agreement.  CODE STATUS: DNR  TOTAL TIME TAKING CARE OF THIS PATIENT: 30 minutes.   More than 50% of the time was spent in counseling/coordination of care: YES  POSSIBLE D/C IN 1 DAYS, DEPENDING ON CLINICAL CONDITION.   Mayes Sangiovanni PA-C on 05/22/2018 at 8:51 AM  Between 7am to 6pm - Pager - [phone]  After 6 pm go to [email or web address] - Proofreader  [employer]   Hospitalists  Office  [phone]  CC: Primary care physician; Glendon Axe, MD  Note: This dictation was prepared with Dragon dictation along with smaller phrase technology. Any transcriptional errors that result from this process are unintentional.

## 2018-05-22 NOTE — Progress Notes (Signed)
Subjective: No new complaints.  Appears more awake and alert this AM.   Objective: Vital signs in last 24 hours: Temp:  [97.8 F (36.6 C)-98.6 F (37 C)] 98.1 F (36.7 C) (03/06 0511) Pulse Rate:  [74-92] 78 (03/06 0511) Resp:  [16-22] 22 (03/06 0511) BP: (156-178)/(52-75) 159/62 (03/06 0511) SpO2:  [96 %-98 %] 97 % (03/06 0511)  Intake/Output from previous day: 03/05 0701 - 03/06 0700 In: 1633.3 [P.O.:480; I.V.:1153.3] Out: 1325 [Urine:1325] Intake/Output this shift: No intake/output data recorded.  Recent Labs    05/20/18 1233 05/21/18 0925 05/22/18 0436  HGB 8.4* 8.6* 8.2*   Recent Labs    05/21/18 0925 05/22/18 0436  WBC 9.2 10.7*  RBC 2.69* 2.56*  HCT 26.9* 25.0*  PLT 231 270   Recent Labs    05/21/18 0925 05/22/18 0436  NA 131* 134*  K 3.7 3.5  CL 99 104  CO2 24 23  BUN 16 13  CREATININE 0.61 0.55  GLUCOSE 111* 102*  CALCIUM 7.7* 7.5*   No results for input(s): LABPT, INR in the last 72 hours.  Physical Exam: Moderate amount of serous drainage on proximal dressing.  No s/sx infection.  NV intact to left lower extremity.  Assessment: S/P IM nailing of left IT fracture  Plan: Continue mobilization with PT as symptoms/medical condition permit. Change dressings as needed.   Marshall Cork Wanette Robison 05/22/2018, 7:48 AM

## 2018-05-22 NOTE — Telephone Encounter (Signed)
App made ° ° °Michelle  °

## 2018-05-22 NOTE — Progress Notes (Signed)
Physical Therapy Treatment Patient Details Name: Christine [name] MRN: [id number] DOB: 25-Nov-1920 Today's Date: 05/22/2018    History of Present Illness presented to ER secondary to dysarthria, AMS; admitted for TIA/CVA management.  MRI significant for acute L thalamic infarct.  Of note, patient with recent L hip fracture, s/p IM nailing (05/15/18), WBAT.    PT Comments    Patient/daughter agreeable to participation with session this date, hopeful for transition to sitting edge of bed (for attempted repositioning, pain management).  Able to complete with mod/max assist +2, but tolerance limited by pain in abdomen/L hip.  Generally anxious/restless throughout session; intermittently responsive to cuing for relaxation/distraction, but unable to progress activity beyond unsupported sitting edge of bed. Repositioned to R sidelying for comfort end of session.    Follow Up Recommendations  SNF     Equipment Recommendations       Recommendations for Other Services       Precautions / Restrictions Precautions Precautions: Fall Precaution Comments: very HOH Restrictions Weight Bearing Restrictions: Yes LLE Weight Bearing: Weight bearing as tolerated    Mobility  Bed Mobility Overal bed mobility: Needs Assistance Bed Mobility: Supine to Sit Rolling: Max assist   Supine to sit: Max assist;+2 for physical assistance     General bed mobility comments: generally restless and uncomfortable; attempted relaxation and distraction  Transfers                 General transfer comment: unsafe/unable  Ambulation/Gait             General Gait Details: unsafe/unable   Stairs             Wheelchair Mobility    Modified Rankin (Stroke Patients Only)       Balance Overall balance assessment: Needs assistance Sitting-balance support: No upper extremity supported;Feet supported Sitting balance-Leahy Scale: Poor                                       Cognition Arousal/Alertness: Awake/alert Behavior During Therapy: Anxious                                   General Comments: oriented to self, general situation; generally restless and uncomfortable, but follows commands and attempts participation      Exercises Other Exercises Other Exercises: Unsupported sitting edge of bed, mod assist for balance and midline--prefers weight shift to R to unweight L hip. Participated with alternate UE reaching/target tapping; very limited ability to move outside immediate BOS (due to fear, pain)    General Comments        Pertinent Vitals/Pain Pain Assessment: Faces Faces Pain Scale: Hurts whole lot Pain Location: abdomen (bladder spasm?), L hip Pain Descriptors / Indicators: Aching;Grimacing;Guarding;Moaning Pain Intervention(s): Limited activity within patient's tolerance;Monitored during session;Repositioned;Premedicated before session    Home Living                      Prior Function            PT Goals (current goals can now be found in the care plan section) Acute Rehab PT Goals Patient Stated Goal: to improve pain and decrease confusion PT Goal Formulation: With patient/family Time For Goal Achievement: 06/04/18 Potential to Achieve Goals: Fair Progress towards PT goals: Progressing toward goals    Frequency  Min 2X/week      PT Plan Current plan remains appropriate    Co-evaluation              AM-PAC PT "6 Clicks" Mobility   Outcome Measure  Help needed turning from your back to your side while in a flat bed without using bedrails?: Total Help needed moving from lying on your back to sitting on the side of a flat bed without using bedrails?: Total Help needed moving to and from a bed to a chair (including a wheelchair)?: Total Help needed standing up from a chair using your arms (e.g., wheelchair or bedside chair)?: Total Help needed to walk in hospital room?: Total Help needed  climbing 3-5 steps with a railing? : Total 6 Click Score: 6    End of Session Equipment Utilized During Treatment: Gait belt;Oxygen Activity Tolerance: Patient limited by pain Patient left: in bed;with call bell/phone within reach;with bed alarm set Nurse Communication: Mobility status PT Visit Diagnosis: Muscle weakness (generalized) (M62.81);Unsteadiness on feet (R26.81);Difficulty in walking, not elsewhere classified (R26.2);Pain Pain - Right/Left: Left Pain - part of body: Hip     Time: 1151-1207 PT Time Calculation (min) (ACUTE ONLY): 16 min  Charges:  $Therapeutic Activity: 8-22 mins                     Marilyne Haseley H. Owens Shark, PT, DPT, NCS 05/22/18, 1:50 PM [phone]

## 2018-05-22 NOTE — Telephone Encounter (Signed)
-----   Message from Billey Co, MD sent at 05/21/2018  5:56 PM EST ----- Regarding: foley removal Please schedule foley removal and void trial with RN in 7-10 days.  Nickolas Madrid, MD 05/21/2018

## 2018-05-23 LAB — BASIC METABOLIC PANEL
Anion gap: 10 (ref 5–15)
BUN: 10 mg/dL (ref 8–23)
CO2: 21 mmol/L — ABNORMAL LOW (ref 22–32)
Calcium: 7.7 mg/dL — ABNORMAL LOW (ref 8.9–10.3)
Chloride: 102 mmol/L (ref 98–111)
Creatinine, Ser: 0.55 mg/dL (ref 0.44–1.00)
GFR calc Af Amer: >60 mL/min (ref >60)
GFR calc non Af Amer: >60 mL/min (ref >60)
Glucose, Bld: 104 mg/dL — ABNORMAL HIGH (ref 70–99)
Potassium: 3.4 mmol/L — ABNORMAL LOW (ref 3.5–5.1)
Sodium: 133 mmol/L — ABNORMAL LOW (ref 135–145)

## 2018-05-23 LAB — CBC
HEMATOCRIT: 26 % — AB (ref 36.0–46.0)
Hemoglobin: 8.5 g/dL — ABNORMAL LOW (ref 12.0–15.0)
MCH: 31.8 pg (ref 26.0–34.0)
MCHC: 32.7 g/dL (ref 30.0–36.0)
MCV: 97.4 fL (ref 80.0–100.0)
Platelets: 293 10*3/uL (ref 150–400)
RBC: 2.67 MIL/uL — ABNORMAL LOW (ref 3.87–5.11)
RDW: 14.4 % (ref 11.5–15.5)
WBC: 14.5 10*3/uL — ABNORMAL HIGH (ref 4.0–10.5)
nRBC: 0 % (ref 0.0–0.2)

## 2018-05-23 MED ORDER — HYDROCHLOROTHIAZIDE 25 MG PO TABS
25.0000 mg | ORAL_TABLET | Freq: Every day | ORAL | Status: DC
  Administered 2018-05-24 – 2018-05-26 (×3): 25 mg via ORAL
  Filled 2018-05-23 (×3): qty 1

## 2018-05-23 MED ORDER — POTASSIUM CHLORIDE CRYS ER 20 MEQ PO TBCR
40.0000 meq | EXTENDED_RELEASE_TABLET | Freq: Once | ORAL | Status: AC
  Administered 2018-05-23: 40 meq via ORAL
  Filled 2018-05-23: qty 2

## 2018-05-23 MED ORDER — MORPHINE SULFATE (PF) 2 MG/ML IV SOLN
2.0000 mg | Freq: Once | INTRAVENOUS | Status: AC
  Administered 2018-05-24: 08:00:00 2 mg via INTRAVENOUS
  Filled 2018-05-23: qty 1

## 2018-05-23 NOTE — Progress Notes (Signed)
BP 175/65, PRN hydralazine 10mg  given. Will reassess.

## 2018-05-23 NOTE — Progress Notes (Signed)
[place] at [place] NAME: Christine [name]    MR#:  [id number]  DATE OF BIRTH:  04/07/1920  SUBJECTIVE:  CHIEF COMPLAINT:   Chief Complaint  Patient presents with  . Altered Mental Status   This morning patient still complaining of bladder spasm/pains.  No fevers.  Nursing staff to change Foley catheter which was noted to be leaking.  Updated daughter present at bedside. REVIEW OF SYSTEMS:  Review of Systems  Constitutional: Negative for chills and fever.  HENT: Negative for hearing loss and tinnitus.   Eyes: Negative for blurred vision and double vision.  Respiratory: Negative for cough and shortness of breath.   Cardiovascular: Negative for chest pain, palpitations and orthopnea.  Gastrointestinal: Negative for heartburn, nausea and vomiting.  Genitourinary: Negative for dysuria and hematuria.       Bladder spasms  Musculoskeletal: Negative for myalgias and neck pain.  Skin: Negative for rash.  Neurological: Negative for dizziness and headaches.  Psychiatric/Behavioral: Negative for hallucinations.    DRUG ALLERGIES:   Allergies  Allergen Reactions  . Adhesive [Tape] Other (See Comments)    Red itchy  . Tapentadol Other (See Comments)    Red itchy   VITALS:  Blood pressure (!) 163/69, pulse 75, temperature (!) 97.5 F (36.4 C), temperature source Oral, resp. rate 18, height 5\' 7"  (1.702 m), weight 59 kg, SpO2 94 %. PHYSICAL EXAMINATION:  Physical Exam  GENERAL:85 y.o.-year-old patient lying in the bed withno acute distress. EYES: Pupils equal, round, reactive to light and accommodation. No scleral icterus. Extraocular muscles intact.  HEENT: Head atraumatic, normocephalic. Oropharynx and nasopharynx clear. No oropharyngeal erythema, moist oral mucosa  NECK: Supple, no jugular venous distention. No thyroid enlargement, no tenderness.  LUNGS: Normal breath sounds bilaterallyanteriorly,exam limited by patient's  unwillingness to turn over or sit up in bed,no wheezing, rales, rhonchi. No use of accessory muscles of respiration.  CARDIOVASCULAR: S1, S2 normal. No murmurs, rubs, or gallops.  ABDOMEN:Soft, nondistended. Bowel sounds present. No organomegaly or mass.  Mild suprapubic tenderness but no rebound.   Foley catheter in place EXTREMITIES:L hip with proximal and distal incision site dressings in place.No pedal edema, cyanosis, or clubbing. + 2 pedal &radial pulses b/l.  NEUROLOGIC: Cranial nerves II through XII are intact.No dysarthria noted. Following instructions without difficulty. Difficulty moving her left lower extremity due to pain from recent hip fracture. PSYCHIATRIC: The patient is alert andawake.Pleasantly confused, at baseline. SKIN: No obvious rash, lesion, or ulcer. GU: indwelling catheter   LABORATORY PANEL:  Female CBC Recent Labs  Lab 05/23/18 0448  WBC 14.5*  HGB 8.5*  HCT 26.0*  PLT 293   ------------------------------------------------------------------------------------------------------------------ Chemistries  Recent Labs  Lab 05/23/18 0448  NA 133*  K 3.4*  CL 102  CO2 21*  GLUCOSE 104*  BUN 10  CREATININE 0.55  CALCIUM 7.7*   RADIOLOGY:  No results found. ASSESSMENT AND PLAN:    Patient was recently in the hospital for hip fracture and her granddaughter mentions she barely took 3 steps and was discharged home with home health. Home health was supposed to come in 05/21/2018 but patient ended up in the hospital.  Patient was readmitted today hospital with diagnosis of acute CVA.  Initial presenting symptom was worse confusion and dysarthria.  1.Acute left thalamic CVAwith dysarthria, right-sided weakness and confusion. Resolving symptomatically. -Consulted neurology, seen by NP Ouma -CheckedMRI of the brain, Carotid dopplers, Echo, per neuro review: "MRI of the brain reviewed and shows  acute infarct of the medial left thalamus. Etiology  likely small vessel disease in the setting of elevated blood pressure on presentation. MRA head shows no large vessel occlusion or significant stenosis. Ultrasound carotid bilateral shows no evidence of hemodynamically significant stenosis.Echocardiogram did not show cardiac source of emboli EF 45 to 50%.HgbA1c5.3, LDL 29." - Startedaspirin initially. Then discovered in recent discharge summary that she was just recently taking Xarelto. Stopped Aspirin.Continue Xarelto and high intensity statins . - PT/OT/Speech consults - was not in the TPA window -Plan is for discharge home with home health services when medically stable  2. Hypertension [Initially problem list read: Elevated blood pressure without diagnosis of hypertension. Likely due to acute CVA.  Permissive hypertension was initially allowed due to recent CVA Hydrochlorothiazide previously resumed.  Blood pressure not optimal.  Increased dose of hydrochlorothiazide from 12.5 mg to 25 mg daily.  Continue PRN IV hydralazine with parameters.  3.Recent hip fracture.POD # 8, L hip intramedullary fixation.Continue physical therapy.Consulted orthopedic surgery, Dr. Roland Rack. WBAT.  Tramadol and acetaminophen for pain.  3.Suprapubic pain likely secondary to recent urinary retention Negative UA with negative urine culture. Describing "spasm" like pain which is recurrent. Consult placed to urology at daughter's request for evaluation. Bladder analgesics and anti-spasmodic (anti-cholinergic) agents deferred given her overall condition and advanced age 83/07/2018.   Urology recommended foley 7-10 days. No further workup. Follow-up at discharge for voiding trial.  To leave Foley catheter in place on discharge 05/22/2018 she continued to request something additional, palliative care NP Megan ordered oxybutynin. Hold if delirious.   DVT prophylaxis/history of DVT ; patient on Xarelto  Disposition; anticipate discharge home once bladder spasms/pains  better controlled Discharge needs: [facility] PT/OT, outpatient palliative care   All the records are reviewed and case discussed with Care Management/Social Worker. Management plans discussed with the patient, family and they are in agreement.  CODE STATUS: DNR  TOTAL TIME TAKING CARE OF THIS PATIENT: 38 minutes.   More than 50% of the time was spent in counseling/coordination of care: YES  POSSIBLE D/C IN 2 DAYS, DEPENDING ON CLINICAL CONDITION.   Aaliyha Mumford M.D on 05/23/2018 at 11:37 AM  Between 7am to 6pm - Pager - [phone]  After 6pm go to [email or web address] - Proofreader  [facility]  Office  [phone]  CC: Primary care physician; Glendon Axe, MD  Note: This dictation was prepared with Dragon dictation along with smaller phrase technology. Any transcriptional errors that result from this process are unintentional.

## 2018-05-23 NOTE — Plan of Care (Signed)
  Problem: Education: Goal: Knowledge of disease or condition will improve Outcome: Progressing Goal: Knowledge of secondary prevention will improve Outcome: Progressing   Problem: Coping: Goal: Will verbalize positive feelings about self Outcome: Progressing   Problem: Self-Care: Goal: Ability to communicate needs accurately will improve Outcome: Progressing   Problem: Nutrition: Goal: Dietary intake will improve Outcome: Progressing   Problem: Elimination: Goal: Will not experience complications related to bowel motility Outcome: Progressing Goal: Will not experience complications related to urinary retention Outcome: Progressing   Problem: Pain Managment: Goal: General experience of comfort will improve Outcome: Progressing   Problem: Safety: Goal: Ability to remain free from injury will improve Outcome: Progressing

## 2018-05-23 NOTE — Progress Notes (Signed)
BP down to154/71 after prn hydralazine.

## 2018-05-24 LAB — BASIC METABOLIC PANEL
Anion gap: 8 (ref 5–15)
BUN: 10 mg/dL (ref 8–23)
CO2: 22 mmol/L (ref 22–32)
Calcium: 8 mg/dL — ABNORMAL LOW (ref 8.9–10.3)
Chloride: 101 mmol/L (ref 98–111)
Creatinine, Ser: 0.63 mg/dL (ref 0.44–1.00)
GFR calc non Af Amer: >60 mL/min (ref >60)
Glucose, Bld: 103 mg/dL — ABNORMAL HIGH (ref 70–99)
Potassium: 3.6 mmol/L (ref 3.5–5.1)
Sodium: 131 mmol/L — ABNORMAL LOW (ref 135–145)

## 2018-05-24 LAB — CBC
HCT: 27 % — ABNORMAL LOW (ref 36.0–46.0)
Hemoglobin: 8.8 g/dL — ABNORMAL LOW (ref 12.0–15.0)
MCH: 32.4 pg (ref 26.0–34.0)
MCHC: 32.6 g/dL (ref 30.0–36.0)
MCV: 99.3 fL (ref 80.0–100.0)
Platelets: 313 10*3/uL (ref 150–400)
RBC: 2.72 MIL/uL — ABNORMAL LOW (ref 3.87–5.11)
RDW: 14.6 % (ref 11.5–15.5)
WBC: 12.7 10*3/uL — ABNORMAL HIGH (ref 4.0–10.5)
nRBC: 0 % (ref 0.0–0.2)

## 2018-05-24 LAB — MAGNESIUM: Magnesium: 1.9 mg/dL (ref 1.7–2.4)

## 2018-05-24 MED ORDER — MORPHINE SULFATE (PF) 2 MG/ML IV SOLN
2.0000 mg | Freq: Four times a day (QID) | INTRAVENOUS | Status: DC | PRN
  Administered 2018-05-24 – 2018-05-26 (×3): 2 mg via INTRAVENOUS
  Filled 2018-05-24 (×3): qty 1

## 2018-05-24 MED ORDER — BELLADONNA ALKALOIDS-OPIUM 16.2-60 MG RE SUPP
1.0000 | Freq: Two times a day (BID) | RECTAL | Status: DC | PRN
  Administered 2018-05-24 – 2018-05-26 (×2): 1 via RECTAL
  Filled 2018-05-24 (×3): qty 1

## 2018-05-24 MED ORDER — MIRABEGRON ER 25 MG PO TB24
25.0000 mg | ORAL_TABLET | Freq: Every day | ORAL | Status: DC
  Administered 2018-05-24 – 2018-05-26 (×3): 25 mg via ORAL
  Filled 2018-05-24 (×3): qty 1

## 2018-05-24 MED ORDER — PHENAZOPYRIDINE HCL 100 MG PO TABS
100.0000 mg | ORAL_TABLET | Freq: Two times a day (BID) | ORAL | Status: DC
  Administered 2018-05-24 – 2018-05-25 (×3): 100 mg via ORAL
  Filled 2018-05-24 (×4): qty 1

## 2018-05-24 NOTE — Care Management Important Message (Signed)
Important Message  Patient Details  Name: Christine [name] MRN: [id number] Date of Birth: 09-Nov-1920   Medicare Important Message Given:  Yes    August Longest A Theodoro Koval, RN 05/24/2018, 9:08 AM

## 2018-05-24 NOTE — Progress Notes (Signed)
[place] at [place] NAME: Christine [name]    MR#:  [id number]  DATE OF BIRTH:  12-17-1920  SUBJECTIVE:  CHIEF COMPLAINT:   Chief Complaint  Patient presents with  . Altered Mental Status   Patient did well overnight with no bladder spasms.  Started having more severe bladder spasms about 6 AM this morning and was crying and yelling in pain this morning.  Was placed on Pyridium.  Given a dose of morphine.  I called and discussed case with urologist on-call Dr. Erlene Quan who recommended B&O suppository which has been initiated.    Updated daughter present at bedside.  REVIEW OF SYSTEMS:  Review of Systems  Constitutional: Negative for chills and fever.  HENT: Negative for hearing loss and tinnitus.   Eyes: Negative for blurred vision and double vision.  Respiratory: Negative for cough and shortness of breath.   Cardiovascular: Negative for chest pain, palpitations and orthopnea.  Gastrointestinal: Negative for heartburn, nausea and vomiting.  Genitourinary: Negative for dysuria and hematuria.       Bladder spasms  Musculoskeletal: Negative for myalgias and neck pain.  Skin: Negative for rash.  Neurological: Negative for dizziness and headaches.  Psychiatric/Behavioral: Negative for hallucinations.    DRUG ALLERGIES:   Allergies  Allergen Reactions  . Adhesive [Tape] Other (See Comments)    Red itchy  . Tapentadol Other (See Comments)    Red itchy   VITALS:  Blood pressure (!) 161/53, pulse 68, temperature 97.8 F (36.6 C), resp. rate 16, height 5\' 7"  (1.702 m), weight 59 kg, SpO2 96 %. PHYSICAL EXAMINATION:  Physical Exam  GENERAL:85 y.o.-year-old patient lying in the bed withno acute distress. EYES: Pupils equal, round, reactive to light and accommodation. No scleral icterus. Extraocular muscles intact.  HEENT: Head atraumatic, normocephalic. Oropharynx and nasopharynx clear. No oropharyngeal erythema, moist oral mucosa    NECK: Supple, no jugular venous distention. No thyroid enlargement, no tenderness.  LUNGS: Normal breath sounds bilaterallyanteriorly,exam limited by patient's unwillingness to turn over or sit up in bed,no wheezing, rales, rhonchi. No use of accessory muscles of respiration.  CARDIOVASCULAR: S1, S2 normal. No murmurs, rubs, or gallops.  ABDOMEN:Soft, nondistended. Bowel sounds present. No organomegaly or mass.  Mild suprapubic tenderness but no rebound.   EXTREMITIES:L hip with proximal and distal incision site dressings in place.No pedal edema, cyanosis, or clubbing. + 2 pedal &radial pulses b/l.  NEUROLOGIC: Cranial nerves II through XII are intact.No dysarthria noted. Following instructions without difficulty. Difficulty moving her left lower extremity due to pain from recent hip fracture. GU; Patient has Foley catheter in place.  Mild suprapubic tenderness. PSYCHIATRIC: The patient is alert andawake.Pleasantly confused, at baseline. SKIN: No obvious rash, lesion, or ulcer. GU: indwelling catheter   LABORATORY PANEL:  Female CBC Recent Labs  Lab 05/24/18 0536  WBC 12.7*  HGB 8.8*  HCT 27.0*  PLT 313   ------------------------------------------------------------------------------------------------------------------ Chemistries  Recent Labs  Lab 05/24/18 0536  NA 131*  K 3.6  CL 101  CO2 22  GLUCOSE 103*  BUN 10  CREATININE 0.63  CALCIUM 8.0*  MG 1.9   RADIOLOGY:  No results found. ASSESSMENT AND PLAN:    Patient was recently in the hospital for hip fracture and her granddaughter mentions she barely took 3 steps and was discharged home with home health. Home health was supposed to come in 05/21/2018 but patient ended up in the hospital.  Patient was readmitted today hospital with diagnosis of acute  CVA.  Initial presenting symptom was worsening confusion and dysarthria.  1.Acute left thalamic CVAwith dysarthria, right-sided weakness and confusion.  Resolving symptomatically. -Consulted neurology, seen by NP Ouma -CheckedMRI of the brain, Carotid dopplers, Echo, per neuro review: "MRI of the brain reviewed and shows acute infarct of the medial left thalamus. Etiology likely small vessel disease in the setting of elevated blood pressure on presentation. MRA head shows no large vessel occlusion or significant stenosis. Ultrasound carotid bilateral shows no evidence of hemodynamically significant stenosis.Echocardiogram did not show cardiac source of emboli EF 45 to 50%.HgbA1c5.3, LDL 29." - Startedaspirin initially. Then discovered in recent discharge summary that she was just recently taking Xarelto. Stopped Aspirin.Continue Xarelto and high intensity statins . - PT/OT/Speech consults - was not in the TPA window -Plan is for discharge home with home health services when medically stable  2. Hypertension [Initially problem list read: Elevated blood pressure without diagnosis of hypertension. Likely due to acute CVA.  Permissive hypertension was initially allowed due to recent CVA Hydrochlorothiazide previously resumed.  Blood pressure not optimal.  Increased dose of hydrochlorothiazide from 12.5 mg to 25 mg daily.  Continue PRN IV hydralazine with parameters.  3.Recent hip fracture.POD # 8, L hip intramedullary fixation.Continue physical therapy.Consulted orthopedic surgery, Dr. Roland Rack. WBAT.  Tramadol and acetaminophen for pain.  3.Suprapubic pain likely secondary to recent urinary retention Negative UA with negative urine culture. Describing "spasm" like pain which is recurrent. Consult placed to urology at daughter's request for evaluation. Urology recommended foley 7-10 days. No further workup. Follow-up at discharge for voiding trial.  To leave Foley catheter in place on discharge 05/22/2018 she continued to request something additional, palliative care NP Megan ordered oxybutynin. Hold if delirious.  Patient did well overnight  with no bladder spasms.  Started having more severe bladder spasms about 6 AM this morning and was crying and yelling in pain this morning.  Was placed on Pyridium.  Given a dose of morphine.  I called and discussed case with urologist on-call Dr. Erlene Quan who recommended B&O suppository which has been initiated.     DVT prophylaxis/history of DVT ; patient on Xarelto  Disposition; anticipate discharge home once bladder spasms/pains better controlled Discharge needs: [facility] PT/OT, outpatient palliative care   All the records are reviewed and case discussed with Care Management/Social Worker. Management plans discussed with the patient, family and they are in agreement.  CODE STATUS: DNR  TOTAL TIME TAKING CARE OF THIS PATIENT: 36 minutes.   More than 50% of the time was spent in counseling/coordination of care: YES  POSSIBLE D/C IN 1-2 DAYS, DEPENDING ON CLINICAL CONDITION.   Thurston Brendlinger M.D on 05/24/2018 at 11:04 AM  Between 7am to 6pm - Pager - [phone]  After 6pm go to [email or web address] - Proofreader  [facility]  Office  [phone]  CC: Primary care physician; Glendon Axe, MD  Note: This dictation was prepared with Dragon dictation along with smaller phrase technology. Any transcriptional errors that result from this process are unintentional.

## 2018-05-24 NOTE — Plan of Care (Signed)
  Problem: Education: Goal: Knowledge of disease or condition will improve Outcome: Progressing Goal: Knowledge of secondary prevention will improve Outcome: Progressing   Problem: Coping: Goal: Will verbalize positive feelings about self Outcome: Progressing   Problem: Self-Care: Goal: Ability to communicate needs accurately will improve Outcome: Progressing   Problem: Nutrition: Goal: Dietary intake will improve Outcome: Progressing   Problem: Elimination: Goal: Will not experience complications related to bowel motility Outcome: Progressing Goal: Will not experience complications related to urinary retention Outcome: Progressing   Problem: Pain Managment: Goal: General experience of comfort will improve Outcome: Progressing   Problem: Safety: Goal: Ability to remain free from injury will improve Outcome: Progressing

## 2018-05-25 ENCOUNTER — Inpatient Hospital Stay: Payer: Medicare HMO

## 2018-05-25 LAB — URINALYSIS, COMPLETE (UACMP) WITH MICROSCOPIC
Bilirubin Urine: NEGATIVE
Glucose, UA: NEGATIVE mg/dL
Ketones, ur: NEGATIVE mg/dL
Nitrite: POSITIVE — AB
PROTEIN: 30 mg/dL — AB
Specific Gravity, Urine: 1.009 (ref 1.005–1.030)
WBC, UA: >50 WBC/hpf — ABNORMAL HIGH (ref 0–5)
pH: 5 (ref 5.0–8.0)

## 2018-05-25 MED ORDER — AMLODIPINE BESYLATE 5 MG PO TABS
5.0000 mg | ORAL_TABLET | Freq: Every day | ORAL | Status: DC
  Administered 2018-05-25 – 2018-05-26 (×2): 5 mg via ORAL
  Filled 2018-05-25 (×2): qty 1

## 2018-05-25 MED ORDER — IOHEXOL 300 MG/ML  SOLN
100.0000 mL | Freq: Once | INTRAMUSCULAR | Status: AC | PRN
  Administered 2018-05-25: 100 mL via INTRAVENOUS

## 2018-05-25 MED ORDER — MORPHINE SULFATE (PF) 2 MG/ML IV SOLN: 1.0000 mg | Freq: Once | INTRAVENOUS | Status: DC

## 2018-05-25 MED ORDER — SODIUM CHLORIDE 0.9 % IV SOLN
1.0000 g | INTRAVENOUS | Status: DC
  Administered 2018-05-25 – 2018-05-26 (×2): 1 g via INTRAVENOUS
  Filled 2018-05-25: qty 10
  Filled 2018-05-25: qty 1
  Filled 2018-05-25: qty 10

## 2018-05-25 MED ORDER — PHENAZOPYRIDINE HCL 200 MG PO TABS
Freq: Once | ORAL | Status: DC
  Filled 2018-05-25 (×2): qty 15

## 2018-05-25 NOTE — Care Management Important Message (Signed)
Important Message  Patient Details  Name: Christine [name] MRN: [id number] Date of Birth: 01-18-1921   Medicare Important Message Given:  Yes    [name] A Merlina Marchena 05/25/2018, 11:27 AM

## 2018-05-25 NOTE — Progress Notes (Signed)
[facility] NAME: Kaylah Chiasson    MR#:  [id number]  DATE OF BIRTH:  [birth year]  SUBJECTIVE:  CHIEF COMPLAINT:   Chief Complaint  Patient presents with  . Altered Mental Status   Patient reported to have had significant blood pains and spasms in the early hours of this morning.  CT scan of abdomen and pelvis was done and urinalysis with findings suggestive of UTI.  Patient started on IV Rocephin.  No fevers.  Updated daughter present at bedside on treatment plans.   REVIEW OF SYSTEMS:  Review of Systems  Constitutional: Negative for chills and fever.  HENT: Negative for hearing loss and tinnitus.   Eyes: Negative for blurred vision and double vision.  Respiratory: Negative for cough and shortness of breath.   Cardiovascular: Negative for chest pain, palpitations and orthopnea.  Gastrointestinal: Negative for heartburn, nausea and vomiting.  Genitourinary: Negative for dysuria and hematuria.       Bladder spasms  Musculoskeletal: Negative for myalgias and neck pain.  Skin: Negative for rash.  Neurological: Negative for dizziness and headaches.  Psychiatric/Behavioral: Negative for hallucinations.    DRUG ALLERGIES:   Allergies  Allergen Reactions  . Adhesive [Tape] Other (See Comments)    Red itchy  . Tapentadol Other (See Comments)    Red itchy   VITALS:  Blood pressure (!) 160/69, pulse 66, temperature 98.7 F (37.1 C), temperature source Oral, resp. rate 18, height 5\' 7"  (1.702 m), weight 59 kg, SpO2 95 %. PHYSICAL EXAMINATION:  Physical Exam  GENERAL:85 y.o.-year-old patient lying in the bed withno acute distress. EYES: Pupils equal, round, reactive to light and accommodation. No scleral icterus. Extraocular muscles intact.  HEENT: Head atraumatic, normocephalic. Oropharynx and nasopharynx clear. No oropharyngeal erythema, moist oral mucosa  NECK: Supple, no jugular venous distention. No thyroid enlargement,  no tenderness.  LUNGS: Normal breath sounds bilaterallyanteriorly,exam limited by patient's unwillingness to turn over or sit up in bed,no wheezing, rales, rhonchi. No use of accessory muscles of respiration.  CARDIOVASCULAR: S1, S2 normal. No murmurs, rubs, or gallops.  ABDOMEN:Soft, nondistended. Bowel sounds present. No organomegaly or mass.  Mild suprapubic tenderness but no rebound.   EXTREMITIES:L hip with proximal and distal incision site dressings in place.No pedal edema, cyanosis, or clubbing. + 2 pedal &radial pulses b/l.  NEUROLOGIC: Cranial nerves II through XII are intact.No dysarthria noted. Following instructions without difficulty. Difficulty moving her left lower extremity due to pain from recent hip fracture. GU; Foley catheter discontinued .  Mild suprapubic tenderness. PSYCHIATRIC: The patient is alert andawake.Pleasantly confused, at baseline. SKIN: No obvious rash, lesion, or ulcer. GU: indwelling catheter   LABORATORY PANEL:  Female CBC Recent Labs  Lab 05/24/18 0536  WBC 12.7*  HGB 8.8*  HCT 27.0*  PLT 313   ------------------------------------------------------------------------------------------------------------------ Chemistries  Recent Labs  Lab 05/24/18 0536  NA 131*  K 3.6  CL 101  CO2 22  GLUCOSE 103*  BUN 10  CREATININE 0.63  CALCIUM 8.0*  MG 1.9   RADIOLOGY:  Ct Abdomen Pelvis W Contrast  Result Date: 05/25/2018 CLINICAL DATA:  Hematuria of unknown cause EXAM: CT ABDOMEN AND PELVIS WITH CONTRAST TECHNIQUE: Multidetector CT imaging of the abdomen and pelvis was performed using the standard protocol following bolus administration of intravenous contrast. CONTRAST:  164mL OMNIPAQUE IOHEXOL 300 MG/ML  SOLN COMPARISON:  12/22/2015 FINDINGS: Lower chest:  Cardiomegaly. Hepatobiliary: No focal liver abnormality.Small layering calculi within the gallbladder. No obstructive or  inflammatory changes Pancreas: 2 cysts in the pancreatic body  measuring up to 13 mm, new but likely incidental for patient's age. Most often these are attributed to intraductal papillary mucinous neoplasms. No ductal dilatation. Spleen: Unremarkable. Adrenals/Urinary Tract: Negative adrenals. No hydronephrosis or stone. Simple appearing bilateral renal cysts with lobulated cyst on the right measuring up to 4.8 cm. The bladder is partially obscured by artifact from hip prosthesis. There is still clear avid mucosal enhancement, wall thickening, and perivesicular edema. This is presumably infectious cystitis. Radiation cystitis is considered given history of colon cancer and the degree of osteopenia in the sacrum raising the possibility of prior radiotherapy, but the bladder had a normal appearance on prior CT and colon cancer treatment was reportedly in 1995. Stomach/Bowel: Negative for obstruction or visible bowel inflammation. Vascular/Lymphatic: Atherosclerosis with narrowing at the origins of the celiac and SMA . Reproductive:Hysterectomy Other: No ascites or pneumoperitoneum. Musculoskeletal: Swelling related to recent intertrochanteric left femur fracture. Right hip arthroplasty. Marked osteopenia with compression fractures at L1 through L5 IMPRESSION: 1. Cystitis, please correlate with urinalysis. Suspect prior pelvic radiotherapy in this patient with history of colon cancer. 2. Small pancreatic cysts measuring up to 13 mm, not seen in 2017 but likely incidental given patient's age. 3. Cholelithiasis Electronically Signed   By: Monte Fantasia M.D.   On: 05/25/2018 04:19   ASSESSMENT AND PLAN:    Patient was recently in the hospital for hip fracture and her granddaughter mentions she barely took 3 steps and was discharged home with home health. Home health was supposed to come in 05/21/2018 but patient ended up in the hospital.  Patient was readmitted today hospital with diagnosis of acute CVA.  Initial presenting symptom was worsening confusion and  dysarthria.  1.Acute left thalamic CVAwith dysarthria, right-sided weakness and confusion. Resolving symptomatically. -Consulted neurology, seen by NP Ouma -CheckedMRI of the brain, Carotid dopplers, Echo, per neuro review: "MRI of the brain reviewed and shows acute infarct of the medial left thalamus. Etiology likely small vessel disease in the setting of elevated blood pressure on presentation. MRA head shows no large vessel occlusion or significant stenosis. Ultrasound carotid bilateral shows no evidence of hemodynamically significant stenosis.Echocardiogram did not show cardiac source of emboli EF 45 to 50%.HgbA1c5.3, LDL 29." - Startedaspirin initially. Then discovered in recent discharge summary that she was just recently taking Xarelto. Stopped Aspirin.Continue Xarelto and high intensity statins . - PT/OT/Speech consults - was not in the TPA window -Plan is for discharge home with home health services when medically stable  2. Hypertension [Initially problem list read: Elevated blood pressure without diagnosis of hypertension. Likely due to acute CVA.  Permissive hypertension was initially allowed due to recent CVA Hydrochlorothiazide previously resumed.  Blood pressure not optimal.  Added Norvasc 5 mg p.o. daily.  Continue PRN IV hydralazine with parameters.  3.Recent hip fracture.POD # 8, L hip intramedullary fixation.Continue physical therapy.Consulted orthopedic surgery, Dr. Roland Rack. WBAT.  Tramadol and acetaminophen for pain.  3.Suprapubic pain likely secondary to recent urinary retention Negative UA with negative urine culture. Describing "spasm" like pain which is recurrent. Consult placed to urology at daughter's request for evaluation. Due to persistence of bladder pain and spasms, Foley catheter was discontinued per urology recommendations and patient has been voiding well with no difficulties. Oxybutynin discontinued due to potential for anticholinergic effects in  this elderly patient. Due to significant bladder spasms and pain overnight patient had CT abdomen and pelvis done which revealed evidence of cystitis.  Patient has  had prior radiation therapy due to history of colon cancer.  Possibility of radiation cystitis.  Urinalysis also revealed evidence of cystitis.  Patient started on IV antibiotics with Rocephin pending results of urine culture and sensitivities. Continue Myrbetriq and B&O suppository as recommended by urologist.  DVT prophylaxis/history of DVT ; patient on Xarelto  Disposition; anticipate discharge home with home health services once bladder spasms/pains better controlled Discharge needs: [facility] PT/OT, outpatient palliative care   All the records are reviewed and case discussed with Care Management/Social Worker. Management plans discussed with the patient, family and they are in agreement.  CODE STATUS: DNR  TOTAL TIME TAKING CARE OF THIS PATIENT: 36 minutes.   More than 50% of the time was spent in counseling/coordination of care: YES  POSSIBLE D/C IN 1-2 DAYS, DEPENDING ON CLINICAL CONDITION.   Shauntea Lok M.D on 05/25/2018 at 1:47 PM  Between 7am to 6pm - Pager - [phone]  After 6pm go to [email or web address] - Proofreader  [facility]  Office  [phone]  CC: Primary care physician; Glendon Axe, MD  Note: This dictation was prepared with Dragon dictation along with smaller phrase technology. Any transcriptional errors that result from this process are unintentional.

## 2018-05-25 NOTE — Progress Notes (Signed)
Occupational Therapy Treatment Patient Details Name: Christine [name] MRN: [id number] DOB: Oct 24, 1920 Today's Date: 05/25/2018    History of present illness presented to ER secondary to dysarthria, AMS; admitted for TIA/CVA management.  MRI significant for acute L thalamic infarct.  Of note, patient with recent L hip fracture, s/p IM nailing (05/15/18), WBAT.   OT comments  Pt seen for OT tx this date. Pt demonstrating less pain today with mobility, facilitating her ability to perform functional mobility in preparation for ADL tasks. Pt completed supine > sit EOB with Mod A and sit EOB with CGA to SBA and Min A +2 to stand with RW and VC for hand placement. PT able to turn RW with assist and cues to get to the recliner. Pt following simple commands well this date and family very pleased with her progress this date. Pt continues to benefit from skilled OT services. Pt's granddaughter (who is a home Programmer, applications) reports the family strongly prefers to take the pt home after the hospital and reports having family able to assist as needed. Should pt return home (with appropriate family support in place), recommend [place].    Follow Up Recommendations  SNF(if family able to provided needed level of assist, pt could go home with [facility] services)    Equipment Recommendations  3 in 1 bedside commode    Recommendations for Other Services      Precautions / Restrictions Precautions Precautions: Fall Precaution Comments: very HOH Restrictions Weight Bearing Restrictions: Yes LLE Weight Bearing: Weight bearing as tolerated       Mobility Bed Mobility Overal bed mobility: Needs Assistance Bed Mobility: Supine to Sit     Supine to sit: Mod assist     General bed mobility comments: Mod A for trunk support and LLE mgt  Transfers Overall transfer level: Needs assistance Equipment used: Rolling walker (2 wheeled) Transfers: Sit to/from Stand Sit to Stand: +2 safety/equipment;Min assist          General transfer comment: cues for hand placement on RW    Balance Overall balance assessment: Needs assistance Sitting-balance support: Bilateral upper extremity supported;Feet supported Sitting balance-Leahy Scale: Fair     Standing balance support: Bilateral upper extremity supported;During functional activity Standing balance-Leahy Scale: Poor Standing balance comment: requires BUE support on RW                            ADL either performed or assessed with clinical judgement   ADL Overall ADL's : Needs assistance/impaired                                       General ADL Comments: Min A for UB dressing seated EOB, Max A for LB ADL STS     Vision Patient Visual Report: No change from baseline     Perception     Praxis      Cognition Arousal/Alertness: Awake/alert Behavior During Therapy: Anxious Overall Cognitive Status: Impaired/Different from baseline Area of Impairment: Problem solving;Orientation;Following commands                 Orientation Level: Disoriented to;Place;Time;Situation     Following Commands: Follows one step commands with increased time     Problem Solving: Requires verbal cues General Comments: cognition improving since initial evaluation on 05/21/18        Exercises  Shoulder Instructions       General Comments      Pertinent Vitals/ Pain       Pain Assessment: Faces Faces Pain Scale: Hurts little more Pain Location: seemed to be from L hip with mobility Pain Descriptors / Indicators: Aching;Grimacing;Guarding;Moaning Pain Intervention(s): Limited activity within patient's tolerance;Monitored during session;Repositioned  Home Living                                          Prior Functioning/Environment              Frequency  Min 2X/week        Progress Toward Goals  OT Goals(current goals can now be found in the care plan section)  Progress  towards OT goals: Progressing toward goals  Acute Rehab OT Goals Patient Stated Goal: to improve pain and decrease confusion OT Goal Formulation: With patient/family Time For Goal Achievement: 06/04/18 Potential to Achieve Goals: Good  Plan Frequency remains appropriate;Discharge plan remains appropriate    Co-evaluation                 AM-PAC OT "6 Clicks" Daily Activity     Outcome Measure   Help from another person eating meals?: None Help from another person taking care of personal grooming?: None Help from another person toileting, which includes using toliet, bedpan, or urinal?: A Lot Help from another person bathing (including washing, rinsing, drying)?: A Lot Help from another person to put on and taking off regular upper body clothing?: A Little Help from another person to put on and taking off regular lower body clothing?: A Lot 6 Click Score: 17    End of Session Equipment Utilized During Treatment: Gait belt;Rolling walker  OT Visit Diagnosis: Other abnormalities of gait and mobility (R26.89);Muscle weakness (generalized) (M62.81);History of falling (Z91.81);Pain;Cognitive communication deficit (R41.841);Other symptoms and signs involving cognitive function Symptoms and signs involving cognitive functions: Cerebral infarction Pain - Right/Left: Left Pain - part of body: Hip;Leg   Activity Tolerance Patient tolerated treatment well   Patient Left in chair;with call bell/phone within reach;with chair alarm set;with family/visitor present;with nursing/sitter in room   Nurse Communication Other (comment)(nurse tech - new external catheter)        Time: 4917-9150 OT Time Calculation (min): 16 min  Charges: OT General Charges $OT Visit: 1 Visit OT Treatments $Therapeutic Activity: 8-22 mins  Jeni Salles, MPH, MS, OTR/L ascom [phone] 05/25/18, 12:41 PM

## 2018-05-25 NOTE — Plan of Care (Signed)
  Problem: Education: Goal: Knowledge of disease or condition will improve Outcome: Progressing Goal: Knowledge of secondary prevention will improve Outcome: Progressing   Problem: Coping: Goal: Will verbalize positive feelings about self Outcome: Progressing   Problem: Self-Care: Goal: Ability to communicate needs accurately will improve Outcome: Progressing   Problem: Nutrition: Goal: Dietary intake will improve Outcome: Progressing   Problem: Elimination: Goal: Will not experience complications related to bowel motility Outcome: Progressing Goal: Will not experience complications related to urinary retention Outcome: Progressing   Problem: Pain Managment: Goal: General experience of comfort will improve Outcome: Progressing   Problem: Safety: Goal: Ability to remain free from injury will improve Outcome: Progressing

## 2018-05-25 NOTE — Progress Notes (Signed)
Patient refused neuro checks due to pain throughout the shift.

## 2018-05-25 NOTE — Progress Notes (Signed)
U/A, CT scan received. Verbal order received from Dr. Marcille Blanco for IV ABT.

## 2018-05-25 NOTE — Progress Notes (Signed)
Request U/A from Dr. Marcille Blanco. U/A sent to lab.

## 2018-05-25 NOTE — Progress Notes (Signed)
Patient screaming in pain pointing to her lower abdomen, prn 2mg  morphine given, prn B&O suppository given, patient continuing to yell in pain. Bladder scanned patient, 31ml shown in bladder. Notified Dr. Marcille Blanco on call, received verbal order for a CT of the lower abdomen.

## 2018-05-26 LAB — MAGNESIUM: Magnesium: 1.7 mg/dL (ref 1.7–2.4)

## 2018-05-26 LAB — BASIC METABOLIC PANEL
Anion gap: 7 (ref 5–15)
BUN: 12 mg/dL (ref 8–23)
CO2: 27 mmol/L (ref 22–32)
Calcium: 7.6 mg/dL — ABNORMAL LOW (ref 8.9–10.3)
Chloride: 103 mmol/L (ref 98–111)
Creatinine, Ser: 0.75 mg/dL (ref 0.44–1.00)
GFR calc Af Amer: >60 mL/min (ref >60)
GFR calc non Af Amer: >60 mL/min (ref >60)
GLUCOSE: 103 mg/dL — AB (ref 70–99)
Potassium: 3.2 mmol/L — ABNORMAL LOW (ref 3.5–5.1)
Sodium: 137 mmol/L (ref 135–145)

## 2018-05-26 LAB — CBC
HCT: 28 % — ABNORMAL LOW (ref 36.0–46.0)
Hemoglobin: 8.9 g/dL — ABNORMAL LOW (ref 12.0–15.0)
MCH: 31.9 pg (ref 26.0–34.0)
MCHC: 31.8 g/dL (ref 30.0–36.0)
MCV: 100.4 fL — ABNORMAL HIGH (ref 80.0–100.0)
Platelets: 383 10*3/uL (ref 150–400)
RBC: 2.79 MIL/uL — ABNORMAL LOW (ref 3.87–5.11)
RDW: 14.7 % (ref 11.5–15.5)
WBC: 11.9 10*3/uL — ABNORMAL HIGH (ref 4.0–10.5)
nRBC: 0 % (ref 0.0–0.2)

## 2018-05-26 MED ORDER — AMLODIPINE BESYLATE 5 MG PO TABS: 5.0000 mg | ORAL_TABLET | Freq: Every day | ORAL | 0 refills | Status: AC

## 2018-05-26 MED ORDER — MIRABEGRON ER 25 MG PO TB24: 25.0000 mg | ORAL_TABLET | Freq: Every day | ORAL | 0 refills | Status: AC

## 2018-05-26 MED ORDER — TRAMADOL HCL 50 MG PO TABS: 50.0000 mg | ORAL_TABLET | Freq: Four times a day (QID) | ORAL | 0 refills | Status: AC | PRN

## 2018-05-26 MED ORDER — BELLADONNA ALKALOIDS-OPIUM 16.2-60 MG RE SUPP: 1.0000 | Freq: Two times a day (BID) | RECTAL | 0 refills | Status: DC | PRN

## 2018-05-26 MED ORDER — POTASSIUM CHLORIDE CRYS ER 20 MEQ PO TBCR
40.0000 meq | EXTENDED_RELEASE_TABLET | Freq: Once | ORAL | Status: AC
  Administered 2018-05-26: 40 meq via ORAL
  Filled 2018-05-26: qty 2

## 2018-05-26 MED ORDER — CEFUROXIME AXETIL 250 MG PO TABS: 250.0000 mg | ORAL_TABLET | Freq: Two times a day (BID) | ORAL | 0 refills | Status: AC

## 2018-05-26 MED ORDER — HYDROCHLOROTHIAZIDE 25 MG PO TABS: 25.0000 mg | ORAL_TABLET | Freq: Every day | ORAL | 0 refills | Status: DC

## 2018-05-26 MED ORDER — POLYETHYLENE GLYCOL 3350 17 G PO PACK
17.0000 g | PACK | Freq: Once | ORAL | Status: AC
  Administered 2018-05-26: 17 g via ORAL
  Filled 2018-05-26: qty 1

## 2018-05-26 MED ORDER — ATORVASTATIN CALCIUM 40 MG PO TABS: 40.0000 mg | ORAL_TABLET | Freq: Every day | ORAL | 0 refills | Status: DC

## 2018-05-26 MED ORDER — RIVAROXABAN 20 MG PO TABS: 20.0000 mg | ORAL_TABLET | Freq: Every day | ORAL | 0 refills | Status: DC

## 2018-05-26 NOTE — TOC Benefit Eligibility Note (Signed)
Transition of Care [facility]) Benefit Eligibility Note    Patient Details  Name: CLARINDA OBI MRN: [id number] Date of Birth: [birth year]   Medication/Dose: B & O Suppositories  Covered?: No        Spoke with Person/Company/Phone Number:: [email or web address]/[phone]     Prior Approval: Yes([id number] or submit online (website in comments section))     Additional Notes: Rep unable to find B & O Suppositories (opium-belladonna suppositories) on formulary.  Unable to pull up alternatives.  Advised to call for Prior Approval.  Prior Approval can be started by phone, or submitted electronically at [email or web address]     Dannette Barbara Phone Number: 05/26/2018, 1:59 PM

## 2018-05-26 NOTE — Progress Notes (Signed)
Physical Therapy Treatment Patient Details Name: Christine [name] MRN: [id number] DOB: 1921-01-22 Today's Date: 05/26/2018    History of Present Illness presented to ER secondary to dysarthria, AMS; admitted for TIA/CVA management.  MRI significant for acute L thalamic infarct.  Of note, patient with recent L hip fracture, s/p IM nailing (05/15/18), WBAT.    PT Comments    Pt in bed, reports of general pain throughout.  Daughter stated she recently had BM when offered assistance to commode.  Participated in exercises as described below.  Encouraged out of bed for comfort.  She agreed.  To edge of bed with mod ax  1 and overall increased pain and crying out.  She was given time in sitting to settle where she had several large belches.  She was able to stand with min a x 2 and transfer to chair but remained in pain.  Once in recliner she seemed more comfortable and remained up.  RN notified of request for pain medication.  Unable to tolerate further activity at this time.    Follow Up Recommendations  SNF     Equipment Recommendations  Rolling walker with 5" wheels    Recommendations for Other Services       Precautions / Restrictions Precautions Precautions: Fall Precaution Comments: very HOH Restrictions Weight Bearing Restrictions: Yes LLE Weight Bearing: Weight bearing as tolerated    Mobility  Bed Mobility Overal bed mobility: Needs Assistance Bed Mobility: Supine to Sit Rolling: Mod assist         General bed mobility comments: Mod A for trunk support and LLE mgt  Transfers Overall transfer level: Needs assistance Equipment used: Rolling walker (2 wheeled) Transfers: Sit to/from Stand Sit to Stand: +2 safety/equipment;Min assist         General transfer comment: cues for hand placement on RW  Ambulation/Gait Ambulation/Gait assistance: Min assist;+2 physical assistance Gait Distance (Feet): 3 Feet Assistive device: Rolling walker (2 wheeled)       General  Gait Details: unable to walk any distance due to pain.   Stairs             Wheelchair Mobility    Modified Rankin (Stroke Patients Only)       Balance Overall balance assessment: Needs assistance Sitting-balance support: Bilateral upper extremity supported;Feet supported Sitting balance-Leahy Scale: Poor Sitting balance - Comments: required assist today due to pain   Standing balance support: Bilateral upper extremity supported;During functional activity Standing balance-Leahy Scale: Poor Standing balance comment: requires BUE support on RW                             Cognition Arousal/Alertness: Awake/alert Behavior During Therapy: Anxious;WFL for tasks assessed/performed Overall Cognitive Status: Impaired/Different from baseline                                        Exercises Other Exercises Other Exercises: supine AAROM for BLE ankle pumps, quad sets, LLE heel slides, ab/ad  x 10    General Comments        Pertinent Vitals/Pain Pain Assessment: Faces Faces Pain Scale: Hurts whole lot Pain Location: general discomfort from being in bed that carried over to mobiltiy. Pain Descriptors / Indicators: Aching;Crying;Guarding;Sore Pain Intervention(s): Limited activity within patient's tolerance;Monitored during session;Patient requesting pain meds-RN notified;Repositioned    Home Living  Prior Function            PT Goals (current goals can now be found in the care plan section) Progress towards PT goals: Progressing toward goals    Frequency    Min 2X/week      PT Plan Current plan remains appropriate    Co-evaluation              AM-PAC PT "6 Clicks" Mobility   Outcome Measure  Help needed turning from your back to your side while in a flat bed without using bedrails?: A Lot Help needed moving from lying on your back to sitting on the side of a flat bed without using bedrails?: A  Lot Help needed moving to and from a bed to a chair (including a wheelchair)?: A Lot Help needed standing up from a chair using your arms (e.g., wheelchair or bedside chair)?: A Lot Help needed to walk in hospital room?: A Lot Help needed climbing 3-5 steps with a railing? : Total 6 Click Score: 11    End of Session Equipment Utilized During Treatment: Gait belt;Oxygen Activity Tolerance: Patient limited by pain Patient left: in chair;with call bell/phone within reach;with chair alarm set Nurse Communication: Patient requests pain meds;Other (comment) Pain - Right/Left: Left Pain - part of body: Hip     Time: 4734-0370 PT Time Calculation (min) (ACUTE ONLY): 14 min  Charges:  $Therapeutic Activity: 8-22 mins                    Chesley Noon, PTA 05/26/18, 11:41 AM

## 2018-05-26 NOTE — TOC Benefit Eligibility Note (Signed)
Transition of Care [facility]) Benefit Eligibility Note    Patient Details  Name: JIAYI LENGACHER MRN: [id number] Date of Birth: 1920/09/25   Medication/Dose: B & O Suppositories  Covered?: No        Spoke with Person/Company/Phone Number:: [email or web address]/[phone]     Prior Approval: Yes([phone] or submit online (website in comments section))     Additional Notes: Rep unable to find B & O Suppositories (opium-belladonna suppositories) on formulary.  Unable to pull up alternatives.  Advised to call for Prior Approval.  Prior Approval can be started by phone, or submitted electronically at [email or web address]     Dannette Barbara Phone Number: 05/26/2018, 1:57 PM

## 2018-05-26 NOTE — Progress Notes (Signed)
Pt is being discharged home with [facility]. Discharge papers given and explained to pt's daughter, Kathleene Hazel.  Meds and f/u appointments reviewed. Rx to be picked up from pharmacy.  Awaiting EMS.

## 2018-05-26 NOTE — Care Management (Addendum)
Family has made the decision to discharge home with home health rather than go to skilled nursing facility.  Will require ems for transport.  Agency preference for home health is [facility].  Was receiving PT and OT.  Adding nursing. Patient to discharge home on B and O Suppository.  This is not covered by her [facility] Medicare part D plan.  There are no alternatives offered by her plan for this medication. Found Good Rx coupons for [facility] with cost of 63 dollars for 6 .  Med is ordered prn. Daughter said this would be affordable.  The script will have to be transferred to a [employer]. Not sure which one she will use at present.  Instructed her to have the [facility] pharmacy contact [place] to transfer the script. Also called [employer] pharmacy to inquire whether prescription would be covered if obtained prior auth indicating that no other treatment options controlled patient's symptoms. Initiated a prior auth and CM was informed if it was approved, patient's copay for 20 suppositories would be 95 Dollars.  Updated patient's daughter.  [facility] has the referral for RN PT OT SLP. No DME needed.  Patient's Maxie Barb is not a new medication. Asked Dr Stark Jock to send a prescription for 6 B/O suppositories to [facility] on [address].  [employer] has stated that the prior auth process for the suppositories should be completed today.  Updated patient's daughter.  If [facility] does not have the authorization, daughter will obtain 6 doses from [place].

## 2018-05-26 NOTE — Discharge Summary (Signed)
Christine [name] at [place] NAME: Christine [name]    MR#:  [id number]  DATE OF BIRTH:  [birth year]  DATE OF ADMISSION:  05/20/2018   ADMITTING PHYSICIAN: Hillary Bow, MD  DATE OF DISCHARGE: 05/26/2018  PRIMARY CARE PHYSICIAN: Glendon Axe, MD   ADMISSION DIAGNOSIS:  Confusion [R41.0] CVA (cerebral vascular accident) ([place]) [I63.9] Dysarthria [R47.1] DISCHARGE DIAGNOSIS:  Active Problems:   CVA (cerebral vascular accident) [facility])   Palliative care by specialist   Goals of care, counseling/discussion   Bladder spasms  SECONDARY DIAGNOSIS:   Past Medical History:  Diagnosis Date  . Cancer ([place])    colon  . Carpal tunnel syndrome   . Complication of anesthesia    hard time waking up after thyroidectomy  . DJD (degenerative joint disease)   . Family history of adverse reaction to anesthesia    daughter PONV  . Hearing loss   . Hypothyroidism   . Osteoporosis   . Wears glasses    HOSPITAL COURSE:   Chief complaint; altered mental status  History of presenting complaint; Christine [name]  is a 85 y.o. female with a known history of recent hip fracture, hypothyroidism presented to the emergency room due to acute onset of dysarthria, confusion.  Patient was last known well at 2:15 AM when she spoke with her granddaughter and was oriented and clear with her speech.  Presently has dysarthria.  CT scan of the head shows left thalamic CVA.    Patient was admitted to the medical service for further evaluation.  Please refer to the H&P for further details.   Hospital course; 1.Acute left thalamic CVAwith dysarthria, right-sided weakness and confusion. Resolving symptomatically. -Consulted neurology. CheckedMRI of the brain, Carotid dopplers, Echo, per neuro review: "MRI of the brain reviewed and shows acute infarct of the medial left thalamus. Etiology likely small vessel disease in the setting of elevated blood pressure on presentation. MRA head  shows no large vessel occlusion or significant stenosis. Ultrasound carotid bilateral shows no evidence of hemodynamically significant stenosis.Echocardiogram did not show cardiac source of emboli EF 45 to 50%.HgbA1c5.3, LDL 29." - Startedaspirininitially.Then discovered in recent discharge summary that she was just recently taking Xarelto. Stopped Aspirin.Continue Xarelto and high intensity statins .  Seen by speech, physical therapy and occupational therapist.  Recommendation is to set up home health with physical therapy and skilled nursing on discharge.  Patient was outside window for TPA.  2. Hypertension Blood pressure fairly controlled on current regimen.  Continue the same on discharge.  Outpatient monitoring by primary care physician   3.Recent hip fracture.,L hip intramedullary fixation.Continue physical therapy.Consulted orthopedic surgery, Dr. Roland Rack. WBAT. Tramadol and acetaminophen for pain.  Discussed with orthopedic physician today.  They plan to remove staples prior to discharge from the hospital today.  3.Suprapubic pain likely secondary to recent urinary retention Describing "spasm" like pain which is recurrent.  Patient seen by urologist during this admission.  Due to persistence of bladder pain and spasms, Foley catheter was discontinued per urology recommendations and patient has been voiding well with no difficulties. Oxybutynin discontinued due to potential for anticholinergic effects in this elderly patient. Due to significant bladder spasms and pain recently patient had CT abdomen and pelvis done which revealed evidence of cystitis.  Patient has had prior radiation therapy due to history of colon cancer.  Possibility of radiation cystitis.  Urinalysis also revealed evidence of cystitis.  Patient started on IV antibiotics with Rocephin.  Patient being discharged  empirically on p.o. cefuroxime for a few days.  So far no growth on cultures .Continue Myrbetriq and B&O  suppository as recommended by urologist.  DVT prophylaxis/history of DVT ; patient on Xarelto  Disposition; plans for discharge home today with home health services.   Patient will need outpatient palliative care evaluation on discharge    DISCHARGE CONDITIONS:  Patient clinically stable.  Currently with no pains.  Plans for discharge home today. CONSULTS OBTAINED:  Treatment Team:  Alexis Goodell, MD Poggi, Marshall Cork, MD Billey Co, MD DRUG ALLERGIES:   Allergies  Allergen Reactions  . Adhesive [Tape] Other (See Comments)    Red itchy  . Tapentadol Other (See Comments)    Red itchy   DISCHARGE MEDICATIONS:   Allergies as of 05/26/2018      Reactions   Adhesive [tape] Other (See Comments)   Red itchy   Tapentadol Other (See Comments)   Red itchy      Medication List    STOP taking these medications   furosemide 20 MG tablet Commonly known as:  LASIX   oxyCODONE 5 MG immediate release tablet Commonly known as:  Oxy IR/ROXICODONE     TAKE these medications   acetaminophen 325 MG tablet Commonly known as:  TYLENOL Take 650 mg by mouth every 6 (six) hours as needed for pain.   amLODipine 5 MG tablet Commonly known as:  NORVASC Take 1 tablet (5 mg total) by mouth daily.   atorvastatin 40 MG tablet Commonly known as:  LIPITOR Take 1 tablet (40 mg total) by mouth daily at 6 PM.   calcium carbonate 1500 (600 Ca) MG Tabs tablet Commonly known as:  OSCAL Take 600 mg by mouth 2 (two) times daily with a meal.   cefUROXime 250 MG tablet Commonly known as:  Ceftin Take 1 tablet (250 mg total) by mouth 2 (two) times daily for 4 days.   cholecalciferol 25 MCG (1000 UT) tablet Commonly known as:  VITAMIN D Take 1,000 Units by mouth every other day.   KP Vitamin D3 50 MCG (2000 UT) Caps Generic drug:  Cholecalciferol Take 2,000 Units by mouth daily.   docusate sodium 100 MG capsule Commonly known as:  COLACE Take 1 capsule (100 mg total) by mouth 2 (two)  times daily.   hydrochlorothiazide 25 MG tablet Commonly known as:  HYDRODIURIL Take 1 tablet (25 mg total) by mouth daily.   levothyroxine 112 MCG tablet Commonly known as:  SYNTHROID, LEVOTHROID Take 112 mcg by mouth daily.   Lubricant Eye Drops 0.4-0.3 % Soln Generic drug:  Polyethyl Glycol-Propyl Glycol Place 1 drop into both eyes 3 (three) times daily as needed. For dry eyes.   Melatonin 10 MG Tabs Take 10 mg by mouth at bedtime.   mirabegron ER 25 MG Tb24 tablet Commonly known as:  MYRBETRIQ Take 1 tablet (25 mg total) by mouth daily.   opium-belladonna 16.2-60 MG suppository Commonly known as:  B&O SUPPRETTES Place 1 suppository rectally every 12 (twelve) hours as needed for bladder spasms.   pantoprazole 40 MG tablet Commonly known as:  PROTONIX Take 40 mg by mouth 2 (two) times daily.   polyethylene glycol packet Commonly known as:  MIRALAX / GLYCOLAX Take 17 g by mouth daily as needed for mild constipation.   potassium chloride 10 MEQ tablet Commonly known as:  K-DUR Take 10 mEq by mouth as needed.   raloxifene 60 MG tablet Commonly known as:  EVISTA Take 60 mg by mouth daily.  rivaroxaban 20 MG Tabs tablet Commonly known as:  XARELTO Take 1 tablet (20 mg total) by mouth daily.   traMADol 50 MG tablet Commonly known as:  ULTRAM Take 1 tablet (50 mg total) by mouth every 6 (six) hours as needed for up to 5 days for moderate pain.            Discharge Care Instructions  (From admission, onward)         Start     Ordered   05/26/18 0000  Discharge wound care:     05/26/18 1044           DISCHARGE INSTRUCTIONS:   DIET:  Cardiac diet DISCHARGE CONDITION:  Stable ACTIVITY:  Out of bed with assistance OXYGEN:  Home Oxygen: No.  Oxygen Delivery: room air DISCHARGE LOCATION:  home   If you experience worsening of your admission symptoms, develop shortness of breath, life threatening emergency, suicidal or homicidal thoughts you must  seek medical attention immediately by calling 911 or calling your MD immediately  if symptoms less severe.  You Must read complete instructions/literature along with all the possible adverse reactions/side effects for all the Medicines you take and that have been prescribed to you. Take any new Medicines after you have completely understood and accpet all the possible adverse reactions/side effects.   Please note  You were cared for by a hospitalist during your hospital stay. If you have any questions about your discharge medications or the care you received while you were in the hospital after you are discharged, you can call the unit and asked to speak with the hospitalist on call if the hospitalist that took care of you is not available. Once you are discharged, your primary care physician will handle any further medical issues. Please note that NO REFILLS for any discharge medications will be authorized once you are discharged, as it is imperative that you return to your primary care physician (or establish a relationship with a primary care physician if you do not have one) for your aftercare needs so that they can reassess your need for medications and monitor your lab values.    On the day of Discharge:  VITAL SIGNS:  Blood pressure (!) 158/46, pulse (!) 57, temperature 98.4 F (36.9 C), temperature source Oral, resp. rate 18, height 5\' 7"  (1.702 m), weight 59 kg, SpO2 94 %. PHYSICAL EXAMINATION:  GENERAL:  85 y.o.-year-old patient lying in the bed with no acute distress.  EYES: Pupils equal, round, reactive to light and accommodation. No scleral icterus. Extraocular muscles intact.  HEENT: Head atraumatic, normocephalic. Oropharynx and nasopharynx clear.  NECK:  Supple, no jugular venous distention. No thyroid enlargement, no tenderness.  LUNGS: Normal breath sounds bilaterally, no wheezing, rales,rhonchi or crepitation. No use of accessory muscles of respiration.  CARDIOVASCULAR: S1, S2  normal. No murmurs, rubs, or gallops.  ABDOMEN: Soft, non-tender, non-distended. Bowel sounds present. No organomegaly or mass.  EXTREMITIES: No pedal edema, cyanosis, or clubbing.  NEUROLOGIC: Cranial nerves II through XII are intact. Muscle strength 5/5 in all extremities. Sensation intact. Gait not checked.  PSYCHIATRIC: The patient is alert and oriented x 3.  SKIN: No obvious rash, lesion, or ulcer.  DATA REVIEW:   CBC Recent Labs  Lab 05/26/18 0539  WBC 11.9*  HGB 8.9*  HCT 28.0*  PLT 383    Chemistries  Recent Labs  Lab 05/26/18 0539  NA 137  K 3.2*  CL 103  CO2 27  GLUCOSE 103*  BUN  12  CREATININE 0.75  CALCIUM 7.6*  MG 1.7     Microbiology Results  Results for orders placed or performed during the hospital encounter of 05/20/18  Urine culture     Status: None   Collection Time: 05/20/18  1:21 PM  Result Value Ref Range Status   Specimen Description   Final    URINE, RANDOM Performed at [facility], [address], [place], [place] [address]    Special Requests   Final    NONE Performed at [facility], [address], [place], [place] [address]    Culture   Final    NO GROWTH Performed at [facility], [place] [address], [place], [place] [address]    Report Status 05/21/2018 FINAL  Final  Respiratory Panel by PCR     Status: None   Collection Time: 05/22/18 10:11 AM  Result Value Ref Range Status   Adenovirus NOT DETECTED NOT DETECTED Final   Coronavirus 229E NOT DETECTED NOT DETECTED Final    Comment: (NOTE) The Coronavirus on the Respiratory Panel, DOES NOT test for the novel  Coronavirus (2019 nCoV)    Coronavirus HKU1 NOT DETECTED NOT DETECTED Final   Coronavirus NL63 NOT DETECTED NOT DETECTED Final   Coronavirus OC43 NOT DETECTED NOT DETECTED Final   Metapneumovirus NOT DETECTED NOT DETECTED Final   Rhinovirus / Enterovirus NOT DETECTED NOT DETECTED Final   Influenza A NOT DETECTED NOT DETECTED Final   Influenza B  NOT DETECTED NOT DETECTED Final   Parainfluenza Virus 1 NOT DETECTED NOT DETECTED Final   Parainfluenza Virus 2 NOT DETECTED NOT DETECTED Final   Parainfluenza Virus 3 NOT DETECTED NOT DETECTED Final   Parainfluenza Virus 4 NOT DETECTED NOT DETECTED Final   Respiratory Syncytial Virus NOT DETECTED NOT DETECTED Final   Bordetella pertussis NOT DETECTED NOT DETECTED Final   Chlamydophila pneumoniae NOT DETECTED NOT DETECTED Final   Mycoplasma pneumoniae NOT DETECTED NOT DETECTED Final    Comment: Performed at [facility], [place] [address] [place], [place] [address]    RADIOLOGY:  No results found.   Management plans discussed with the patient, family and they are in agreement.  CODE STATUS: DNR   TOTAL TIME TAKING CARE OF THIS PATIENT: 40 minutes.    Leanna Hamid M.D on 05/26/2018 at 10:47 AM  Between 7am to 6pm - Pager - [phone]  After 6pm go to [email or web address] - Proofreader  [facility]  Office  [phone]  CC: Primary care physician; Glendon Axe, MD   Note: This dictation was prepared with Dragon dictation along with smaller phrase technology. Any transcriptional errors that result from this process are unintentional.

## 2018-05-26 NOTE — Plan of Care (Signed)
Pt able to let you know when she is hurting.

## 2018-05-26 NOTE — Progress Notes (Signed)
Pt has been in pain throughout shift. Pt given morphine as ordered and was able to rest after receiving. Monitoring blood pressure due to pt runs low bp since admission. Pdowless, rn. 05/26/2018 @0546 

## 2018-05-27 ENCOUNTER — Telehealth: Payer: Self-pay | Admitting: Urology

## 2018-05-27 DIAGNOSIS — Z7901 Long term (current) use of anticoagulants: Secondary | ICD-10-CM | POA: Diagnosis not present

## 2018-05-27 DIAGNOSIS — S72002D Fracture of unspecified part of neck of left femur, subsequent encounter for closed fracture with routine healing: Secondary | ICD-10-CM | POA: Diagnosis not present

## 2018-05-27 DIAGNOSIS — W19XXXD Unspecified fall, subsequent encounter: Secondary | ICD-10-CM | POA: Diagnosis not present

## 2018-05-27 DIAGNOSIS — I10 Essential (primary) hypertension: Secondary | ICD-10-CM | POA: Diagnosis not present

## 2018-05-27 DIAGNOSIS — S72009A Fracture of unspecified part of neck of unspecified femur, initial encounter for closed fracture: Secondary | ICD-10-CM | POA: Diagnosis not present

## 2018-05-27 DIAGNOSIS — N3000 Acute cystitis without hematuria: Secondary | ICD-10-CM | POA: Diagnosis not present

## 2018-05-27 DIAGNOSIS — I69351 Hemiplegia and hemiparesis following cerebral infarction affecting right dominant side: Secondary | ICD-10-CM | POA: Diagnosis not present

## 2018-05-27 DIAGNOSIS — I69311 Memory deficit following cerebral infarction: Secondary | ICD-10-CM | POA: Diagnosis not present

## 2018-05-27 DIAGNOSIS — I69322 Dysarthria following cerebral infarction: Secondary | ICD-10-CM | POA: Diagnosis not present

## 2018-05-27 NOTE — Telephone Encounter (Signed)
FYI pt had her foley removed on 05-24-18 per [name] called to verify app she is urinating just fine They said she did not need to keep app   Sharyn Lull

## 2018-05-28 ENCOUNTER — Ambulatory Visit: Payer: Self-pay

## 2018-05-28 DIAGNOSIS — S72002D Fracture of unspecified part of neck of left femur, subsequent encounter for closed fracture with routine healing: Secondary | ICD-10-CM | POA: Diagnosis not present

## 2018-05-28 DIAGNOSIS — N3000 Acute cystitis without hematuria: Secondary | ICD-10-CM | POA: Diagnosis not present

## 2018-05-28 DIAGNOSIS — Z7901 Long term (current) use of anticoagulants: Secondary | ICD-10-CM | POA: Diagnosis not present

## 2018-05-28 DIAGNOSIS — I69322 Dysarthria following cerebral infarction: Secondary | ICD-10-CM | POA: Diagnosis not present

## 2018-05-28 DIAGNOSIS — I69311 Memory deficit following cerebral infarction: Secondary | ICD-10-CM | POA: Diagnosis not present

## 2018-05-28 DIAGNOSIS — I10 Essential (primary) hypertension: Secondary | ICD-10-CM | POA: Diagnosis not present

## 2018-05-28 DIAGNOSIS — W19XXXD Unspecified fall, subsequent encounter: Secondary | ICD-10-CM | POA: Diagnosis not present

## 2018-05-28 DIAGNOSIS — I69351 Hemiplegia and hemiparesis following cerebral infarction affecting right dominant side: Secondary | ICD-10-CM | POA: Diagnosis not present

## 2018-05-29 DIAGNOSIS — R52 Pain, unspecified: Secondary | ICD-10-CM | POA: Diagnosis not present

## 2018-05-29 DIAGNOSIS — T148XXA Other injury of unspecified body region, initial encounter: Secondary | ICD-10-CM | POA: Diagnosis not present

## 2018-05-29 DIAGNOSIS — E039 Hypothyroidism, unspecified: Secondary | ICD-10-CM | POA: Diagnosis not present

## 2018-05-29 DIAGNOSIS — I10 Essential (primary) hypertension: Secondary | ICD-10-CM | POA: Diagnosis not present

## 2018-05-29 DIAGNOSIS — R4701 Aphasia: Secondary | ICD-10-CM | POA: Diagnosis not present

## 2018-05-29 DIAGNOSIS — I6389 Other cerebral infarction: Secondary | ICD-10-CM | POA: Diagnosis not present

## 2018-05-29 DIAGNOSIS — G479 Sleep disorder, unspecified: Secondary | ICD-10-CM | POA: Diagnosis not present

## 2018-05-29 DIAGNOSIS — E876 Hypokalemia: Secondary | ICD-10-CM | POA: Diagnosis not present

## 2018-05-29 DIAGNOSIS — T07XXXA Unspecified multiple injuries, initial encounter: Secondary | ICD-10-CM | POA: Diagnosis not present

## 2018-06-01 DIAGNOSIS — I69311 Memory deficit following cerebral infarction: Secondary | ICD-10-CM | POA: Diagnosis not present

## 2018-06-01 DIAGNOSIS — Z7901 Long term (current) use of anticoagulants: Secondary | ICD-10-CM | POA: Diagnosis not present

## 2018-06-01 DIAGNOSIS — S72002D Fracture of unspecified part of neck of left femur, subsequent encounter for closed fracture with routine healing: Secondary | ICD-10-CM | POA: Diagnosis not present

## 2018-06-01 DIAGNOSIS — I10 Essential (primary) hypertension: Secondary | ICD-10-CM | POA: Diagnosis not present

## 2018-06-01 DIAGNOSIS — N3000 Acute cystitis without hematuria: Secondary | ICD-10-CM | POA: Diagnosis not present

## 2018-06-01 DIAGNOSIS — I69322 Dysarthria following cerebral infarction: Secondary | ICD-10-CM | POA: Diagnosis not present

## 2018-06-01 DIAGNOSIS — W19XXXD Unspecified fall, subsequent encounter: Secondary | ICD-10-CM | POA: Diagnosis not present

## 2018-06-01 DIAGNOSIS — I69351 Hemiplegia and hemiparesis following cerebral infarction affecting right dominant side: Secondary | ICD-10-CM | POA: Diagnosis not present

## 2018-06-02 DIAGNOSIS — I69351 Hemiplegia and hemiparesis following cerebral infarction affecting right dominant side: Secondary | ICD-10-CM | POA: Diagnosis not present

## 2018-06-02 DIAGNOSIS — N3000 Acute cystitis without hematuria: Secondary | ICD-10-CM | POA: Diagnosis not present

## 2018-06-02 DIAGNOSIS — I69311 Memory deficit following cerebral infarction: Secondary | ICD-10-CM | POA: Diagnosis not present

## 2018-06-02 DIAGNOSIS — W19XXXD Unspecified fall, subsequent encounter: Secondary | ICD-10-CM | POA: Diagnosis not present

## 2018-06-02 DIAGNOSIS — I10 Essential (primary) hypertension: Secondary | ICD-10-CM | POA: Diagnosis not present

## 2018-06-02 DIAGNOSIS — S72002D Fracture of unspecified part of neck of left femur, subsequent encounter for closed fracture with routine healing: Secondary | ICD-10-CM | POA: Diagnosis not present

## 2018-06-02 DIAGNOSIS — I69322 Dysarthria following cerebral infarction: Secondary | ICD-10-CM | POA: Diagnosis not present

## 2018-06-02 DIAGNOSIS — Z7901 Long term (current) use of anticoagulants: Secondary | ICD-10-CM | POA: Diagnosis not present

## 2018-06-03 DIAGNOSIS — S72002D Fracture of unspecified part of neck of left femur, subsequent encounter for closed fracture with routine healing: Secondary | ICD-10-CM | POA: Diagnosis not present

## 2018-06-03 DIAGNOSIS — I10 Essential (primary) hypertension: Secondary | ICD-10-CM | POA: Diagnosis not present

## 2018-06-03 DIAGNOSIS — N3000 Acute cystitis without hematuria: Secondary | ICD-10-CM | POA: Diagnosis not present

## 2018-06-03 DIAGNOSIS — I69322 Dysarthria following cerebral infarction: Secondary | ICD-10-CM | POA: Diagnosis not present

## 2018-06-03 DIAGNOSIS — W19XXXD Unspecified fall, subsequent encounter: Secondary | ICD-10-CM | POA: Diagnosis not present

## 2018-06-03 DIAGNOSIS — I69311 Memory deficit following cerebral infarction: Secondary | ICD-10-CM | POA: Diagnosis not present

## 2018-06-03 DIAGNOSIS — W010XXD Fall on same level from slipping, tripping and stumbling without subsequent striking against object, subsequent encounter: Secondary | ICD-10-CM | POA: Diagnosis not present

## 2018-06-03 DIAGNOSIS — Z7901 Long term (current) use of anticoagulants: Secondary | ICD-10-CM | POA: Diagnosis not present

## 2018-06-03 DIAGNOSIS — I69351 Hemiplegia and hemiparesis following cerebral infarction affecting right dominant side: Secondary | ICD-10-CM | POA: Diagnosis not present

## 2018-06-04 DIAGNOSIS — S72002D Fracture of unspecified part of neck of left femur, subsequent encounter for closed fracture with routine healing: Secondary | ICD-10-CM | POA: Diagnosis not present

## 2018-06-04 DIAGNOSIS — I69351 Hemiplegia and hemiparesis following cerebral infarction affecting right dominant side: Secondary | ICD-10-CM | POA: Diagnosis not present

## 2018-06-04 DIAGNOSIS — W19XXXD Unspecified fall, subsequent encounter: Secondary | ICD-10-CM | POA: Diagnosis not present

## 2018-06-04 DIAGNOSIS — Z7901 Long term (current) use of anticoagulants: Secondary | ICD-10-CM | POA: Diagnosis not present

## 2018-06-04 DIAGNOSIS — I69311 Memory deficit following cerebral infarction: Secondary | ICD-10-CM | POA: Diagnosis not present

## 2018-06-04 DIAGNOSIS — I10 Essential (primary) hypertension: Secondary | ICD-10-CM | POA: Diagnosis not present

## 2018-06-04 DIAGNOSIS — I69322 Dysarthria following cerebral infarction: Secondary | ICD-10-CM | POA: Diagnosis not present

## 2018-06-04 DIAGNOSIS — N3000 Acute cystitis without hematuria: Secondary | ICD-10-CM | POA: Diagnosis not present

## 2018-06-05 DIAGNOSIS — I69311 Memory deficit following cerebral infarction: Secondary | ICD-10-CM | POA: Diagnosis not present

## 2018-06-05 DIAGNOSIS — N3000 Acute cystitis without hematuria: Secondary | ICD-10-CM | POA: Diagnosis not present

## 2018-06-05 DIAGNOSIS — I69322 Dysarthria following cerebral infarction: Secondary | ICD-10-CM | POA: Diagnosis not present

## 2018-06-05 DIAGNOSIS — Z7901 Long term (current) use of anticoagulants: Secondary | ICD-10-CM | POA: Diagnosis not present

## 2018-06-05 DIAGNOSIS — I69351 Hemiplegia and hemiparesis following cerebral infarction affecting right dominant side: Secondary | ICD-10-CM | POA: Diagnosis not present

## 2018-06-05 DIAGNOSIS — I10 Essential (primary) hypertension: Secondary | ICD-10-CM | POA: Diagnosis not present

## 2018-06-05 DIAGNOSIS — W19XXXD Unspecified fall, subsequent encounter: Secondary | ICD-10-CM | POA: Diagnosis not present

## 2018-06-05 DIAGNOSIS — S72002D Fracture of unspecified part of neck of left femur, subsequent encounter for closed fracture with routine healing: Secondary | ICD-10-CM | POA: Diagnosis not present

## 2018-06-08 DIAGNOSIS — I69322 Dysarthria following cerebral infarction: Secondary | ICD-10-CM | POA: Diagnosis not present

## 2018-06-08 DIAGNOSIS — I69351 Hemiplegia and hemiparesis following cerebral infarction affecting right dominant side: Secondary | ICD-10-CM | POA: Diagnosis not present

## 2018-06-08 DIAGNOSIS — Z7901 Long term (current) use of anticoagulants: Secondary | ICD-10-CM | POA: Diagnosis not present

## 2018-06-08 DIAGNOSIS — I69311 Memory deficit following cerebral infarction: Secondary | ICD-10-CM | POA: Diagnosis not present

## 2018-06-08 DIAGNOSIS — N3000 Acute cystitis without hematuria: Secondary | ICD-10-CM | POA: Diagnosis not present

## 2018-06-08 DIAGNOSIS — I10 Essential (primary) hypertension: Secondary | ICD-10-CM | POA: Diagnosis not present

## 2018-06-08 DIAGNOSIS — W19XXXD Unspecified fall, subsequent encounter: Secondary | ICD-10-CM | POA: Diagnosis not present

## 2018-06-08 DIAGNOSIS — S72002D Fracture of unspecified part of neck of left femur, subsequent encounter for closed fracture with routine healing: Secondary | ICD-10-CM | POA: Diagnosis not present

## 2018-06-09 DIAGNOSIS — S72002D Fracture of unspecified part of neck of left femur, subsequent encounter for closed fracture with routine healing: Secondary | ICD-10-CM | POA: Diagnosis not present

## 2018-06-09 DIAGNOSIS — N3000 Acute cystitis without hematuria: Secondary | ICD-10-CM | POA: Diagnosis not present

## 2018-06-09 DIAGNOSIS — W19XXXD Unspecified fall, subsequent encounter: Secondary | ICD-10-CM | POA: Diagnosis not present

## 2018-06-09 DIAGNOSIS — Z7901 Long term (current) use of anticoagulants: Secondary | ICD-10-CM | POA: Diagnosis not present

## 2018-06-09 DIAGNOSIS — I69322 Dysarthria following cerebral infarction: Secondary | ICD-10-CM | POA: Diagnosis not present

## 2018-06-09 DIAGNOSIS — I69311 Memory deficit following cerebral infarction: Secondary | ICD-10-CM | POA: Diagnosis not present

## 2018-06-09 DIAGNOSIS — I69351 Hemiplegia and hemiparesis following cerebral infarction affecting right dominant side: Secondary | ICD-10-CM | POA: Diagnosis not present

## 2018-06-09 DIAGNOSIS — I10 Essential (primary) hypertension: Secondary | ICD-10-CM | POA: Diagnosis not present

## 2018-06-10 DIAGNOSIS — W19XXXD Unspecified fall, subsequent encounter: Secondary | ICD-10-CM | POA: Diagnosis not present

## 2018-06-10 DIAGNOSIS — Z7901 Long term (current) use of anticoagulants: Secondary | ICD-10-CM | POA: Diagnosis not present

## 2018-06-10 DIAGNOSIS — I69322 Dysarthria following cerebral infarction: Secondary | ICD-10-CM | POA: Diagnosis not present

## 2018-06-10 DIAGNOSIS — I10 Essential (primary) hypertension: Secondary | ICD-10-CM | POA: Diagnosis not present

## 2018-06-10 DIAGNOSIS — S72002D Fracture of unspecified part of neck of left femur, subsequent encounter for closed fracture with routine healing: Secondary | ICD-10-CM | POA: Diagnosis not present

## 2018-06-10 DIAGNOSIS — I69351 Hemiplegia and hemiparesis following cerebral infarction affecting right dominant side: Secondary | ICD-10-CM | POA: Diagnosis not present

## 2018-06-10 DIAGNOSIS — N3000 Acute cystitis without hematuria: Secondary | ICD-10-CM | POA: Diagnosis not present

## 2018-06-10 DIAGNOSIS — I69311 Memory deficit following cerebral infarction: Secondary | ICD-10-CM | POA: Diagnosis not present

## 2018-06-11 DIAGNOSIS — N3000 Acute cystitis without hematuria: Secondary | ICD-10-CM | POA: Diagnosis not present

## 2018-06-11 DIAGNOSIS — I69351 Hemiplegia and hemiparesis following cerebral infarction affecting right dominant side: Secondary | ICD-10-CM | POA: Diagnosis not present

## 2018-06-11 DIAGNOSIS — I69311 Memory deficit following cerebral infarction: Secondary | ICD-10-CM | POA: Diagnosis not present

## 2018-06-11 DIAGNOSIS — Z7901 Long term (current) use of anticoagulants: Secondary | ICD-10-CM | POA: Diagnosis not present

## 2018-06-11 DIAGNOSIS — S72002D Fracture of unspecified part of neck of left femur, subsequent encounter for closed fracture with routine healing: Secondary | ICD-10-CM | POA: Diagnosis not present

## 2018-06-11 DIAGNOSIS — W19XXXD Unspecified fall, subsequent encounter: Secondary | ICD-10-CM | POA: Diagnosis not present

## 2018-06-11 DIAGNOSIS — I69322 Dysarthria following cerebral infarction: Secondary | ICD-10-CM | POA: Diagnosis not present

## 2018-06-11 DIAGNOSIS — I10 Essential (primary) hypertension: Secondary | ICD-10-CM | POA: Diagnosis not present

## 2018-06-12 DIAGNOSIS — W19XXXD Unspecified fall, subsequent encounter: Secondary | ICD-10-CM | POA: Diagnosis not present

## 2018-06-12 DIAGNOSIS — I10 Essential (primary) hypertension: Secondary | ICD-10-CM | POA: Diagnosis not present

## 2018-06-12 DIAGNOSIS — I69351 Hemiplegia and hemiparesis following cerebral infarction affecting right dominant side: Secondary | ICD-10-CM | POA: Diagnosis not present

## 2018-06-12 DIAGNOSIS — N3000 Acute cystitis without hematuria: Secondary | ICD-10-CM | POA: Diagnosis not present

## 2018-06-12 DIAGNOSIS — I69322 Dysarthria following cerebral infarction: Secondary | ICD-10-CM | POA: Diagnosis not present

## 2018-06-12 DIAGNOSIS — I69311 Memory deficit following cerebral infarction: Secondary | ICD-10-CM | POA: Diagnosis not present

## 2018-06-12 DIAGNOSIS — Z7901 Long term (current) use of anticoagulants: Secondary | ICD-10-CM | POA: Diagnosis not present

## 2018-06-12 DIAGNOSIS — S72002D Fracture of unspecified part of neck of left femur, subsequent encounter for closed fracture with routine healing: Secondary | ICD-10-CM | POA: Diagnosis not present

## 2018-06-15 DIAGNOSIS — W19XXXD Unspecified fall, subsequent encounter: Secondary | ICD-10-CM | POA: Diagnosis not present

## 2018-06-15 DIAGNOSIS — N3000 Acute cystitis without hematuria: Secondary | ICD-10-CM | POA: Diagnosis not present

## 2018-06-15 DIAGNOSIS — I69322 Dysarthria following cerebral infarction: Secondary | ICD-10-CM | POA: Diagnosis not present

## 2018-06-15 DIAGNOSIS — I10 Essential (primary) hypertension: Secondary | ICD-10-CM | POA: Diagnosis not present

## 2018-06-15 DIAGNOSIS — Z7901 Long term (current) use of anticoagulants: Secondary | ICD-10-CM | POA: Diagnosis not present

## 2018-06-15 DIAGNOSIS — I69311 Memory deficit following cerebral infarction: Secondary | ICD-10-CM | POA: Diagnosis not present

## 2018-06-15 DIAGNOSIS — S72002D Fracture of unspecified part of neck of left femur, subsequent encounter for closed fracture with routine healing: Secondary | ICD-10-CM | POA: Diagnosis not present

## 2018-06-15 DIAGNOSIS — I69351 Hemiplegia and hemiparesis following cerebral infarction affecting right dominant side: Secondary | ICD-10-CM | POA: Diagnosis not present

## 2018-06-16 DIAGNOSIS — W19XXXD Unspecified fall, subsequent encounter: Secondary | ICD-10-CM | POA: Diagnosis not present

## 2018-06-16 DIAGNOSIS — N3000 Acute cystitis without hematuria: Secondary | ICD-10-CM | POA: Diagnosis not present

## 2018-06-16 DIAGNOSIS — S72002D Fracture of unspecified part of neck of left femur, subsequent encounter for closed fracture with routine healing: Secondary | ICD-10-CM | POA: Diagnosis not present

## 2018-06-16 DIAGNOSIS — I69322 Dysarthria following cerebral infarction: Secondary | ICD-10-CM | POA: Diagnosis not present

## 2018-06-16 DIAGNOSIS — I10 Essential (primary) hypertension: Secondary | ICD-10-CM | POA: Diagnosis not present

## 2018-06-16 DIAGNOSIS — I69351 Hemiplegia and hemiparesis following cerebral infarction affecting right dominant side: Secondary | ICD-10-CM | POA: Diagnosis not present

## 2018-06-16 DIAGNOSIS — I69311 Memory deficit following cerebral infarction: Secondary | ICD-10-CM | POA: Diagnosis not present

## 2018-06-16 DIAGNOSIS — Z7901 Long term (current) use of anticoagulants: Secondary | ICD-10-CM | POA: Diagnosis not present

## 2018-06-17 DIAGNOSIS — Z7901 Long term (current) use of anticoagulants: Secondary | ICD-10-CM | POA: Diagnosis not present

## 2018-06-17 DIAGNOSIS — W19XXXD Unspecified fall, subsequent encounter: Secondary | ICD-10-CM | POA: Diagnosis not present

## 2018-06-17 DIAGNOSIS — I69351 Hemiplegia and hemiparesis following cerebral infarction affecting right dominant side: Secondary | ICD-10-CM | POA: Diagnosis not present

## 2018-06-17 DIAGNOSIS — I69311 Memory deficit following cerebral infarction: Secondary | ICD-10-CM | POA: Diagnosis not present

## 2018-06-17 DIAGNOSIS — I10 Essential (primary) hypertension: Secondary | ICD-10-CM | POA: Diagnosis not present

## 2018-06-17 DIAGNOSIS — S72002D Fracture of unspecified part of neck of left femur, subsequent encounter for closed fracture with routine healing: Secondary | ICD-10-CM | POA: Diagnosis not present

## 2018-06-17 DIAGNOSIS — N3000 Acute cystitis without hematuria: Secondary | ICD-10-CM | POA: Diagnosis not present

## 2018-06-17 DIAGNOSIS — I69322 Dysarthria following cerebral infarction: Secondary | ICD-10-CM | POA: Diagnosis not present

## 2018-06-18 DIAGNOSIS — N3000 Acute cystitis without hematuria: Secondary | ICD-10-CM | POA: Diagnosis not present

## 2018-06-18 DIAGNOSIS — Z7901 Long term (current) use of anticoagulants: Secondary | ICD-10-CM | POA: Diagnosis not present

## 2018-06-18 DIAGNOSIS — W19XXXD Unspecified fall, subsequent encounter: Secondary | ICD-10-CM | POA: Diagnosis not present

## 2018-06-18 DIAGNOSIS — I69322 Dysarthria following cerebral infarction: Secondary | ICD-10-CM | POA: Diagnosis not present

## 2018-06-18 DIAGNOSIS — I10 Essential (primary) hypertension: Secondary | ICD-10-CM | POA: Diagnosis not present

## 2018-06-18 DIAGNOSIS — S72002D Fracture of unspecified part of neck of left femur, subsequent encounter for closed fracture with routine healing: Secondary | ICD-10-CM | POA: Diagnosis not present

## 2018-06-18 DIAGNOSIS — I69351 Hemiplegia and hemiparesis following cerebral infarction affecting right dominant side: Secondary | ICD-10-CM | POA: Diagnosis not present

## 2018-06-18 DIAGNOSIS — I69311 Memory deficit following cerebral infarction: Secondary | ICD-10-CM | POA: Diagnosis not present

## 2018-06-19 DIAGNOSIS — S72002A Fracture of unspecified part of neck of left femur, initial encounter for closed fracture: Secondary | ICD-10-CM | POA: Diagnosis not present

## 2018-06-22 DIAGNOSIS — W19XXXD Unspecified fall, subsequent encounter: Secondary | ICD-10-CM | POA: Diagnosis not present

## 2018-06-22 DIAGNOSIS — I69322 Dysarthria following cerebral infarction: Secondary | ICD-10-CM | POA: Diagnosis not present

## 2018-06-22 DIAGNOSIS — N3000 Acute cystitis without hematuria: Secondary | ICD-10-CM | POA: Diagnosis not present

## 2018-06-22 DIAGNOSIS — I69311 Memory deficit following cerebral infarction: Secondary | ICD-10-CM | POA: Diagnosis not present

## 2018-06-22 DIAGNOSIS — S72002D Fracture of unspecified part of neck of left femur, subsequent encounter for closed fracture with routine healing: Secondary | ICD-10-CM | POA: Diagnosis not present

## 2018-06-22 DIAGNOSIS — I10 Essential (primary) hypertension: Secondary | ICD-10-CM | POA: Diagnosis not present

## 2018-06-22 DIAGNOSIS — I69351 Hemiplegia and hemiparesis following cerebral infarction affecting right dominant side: Secondary | ICD-10-CM | POA: Diagnosis not present

## 2018-06-22 DIAGNOSIS — Z7901 Long term (current) use of anticoagulants: Secondary | ICD-10-CM | POA: Diagnosis not present

## 2018-06-23 DIAGNOSIS — S72002D Fracture of unspecified part of neck of left femur, subsequent encounter for closed fracture with routine healing: Secondary | ICD-10-CM | POA: Diagnosis not present

## 2018-06-23 DIAGNOSIS — W19XXXD Unspecified fall, subsequent encounter: Secondary | ICD-10-CM | POA: Diagnosis not present

## 2018-06-23 DIAGNOSIS — I69311 Memory deficit following cerebral infarction: Secondary | ICD-10-CM | POA: Diagnosis not present

## 2018-06-23 DIAGNOSIS — N3000 Acute cystitis without hematuria: Secondary | ICD-10-CM | POA: Diagnosis not present

## 2018-06-23 DIAGNOSIS — Z7901 Long term (current) use of anticoagulants: Secondary | ICD-10-CM | POA: Diagnosis not present

## 2018-06-23 DIAGNOSIS — I69351 Hemiplegia and hemiparesis following cerebral infarction affecting right dominant side: Secondary | ICD-10-CM | POA: Diagnosis not present

## 2018-06-23 DIAGNOSIS — I10 Essential (primary) hypertension: Secondary | ICD-10-CM | POA: Diagnosis not present

## 2018-06-23 DIAGNOSIS — I69322 Dysarthria following cerebral infarction: Secondary | ICD-10-CM | POA: Diagnosis not present

## 2018-06-24 DIAGNOSIS — S72002D Fracture of unspecified part of neck of left femur, subsequent encounter for closed fracture with routine healing: Secondary | ICD-10-CM | POA: Diagnosis not present

## 2018-06-24 DIAGNOSIS — I69351 Hemiplegia and hemiparesis following cerebral infarction affecting right dominant side: Secondary | ICD-10-CM | POA: Diagnosis not present

## 2018-06-24 DIAGNOSIS — Z7901 Long term (current) use of anticoagulants: Secondary | ICD-10-CM | POA: Diagnosis not present

## 2018-06-24 DIAGNOSIS — W19XXXD Unspecified fall, subsequent encounter: Secondary | ICD-10-CM | POA: Diagnosis not present

## 2018-06-24 DIAGNOSIS — I69311 Memory deficit following cerebral infarction: Secondary | ICD-10-CM | POA: Diagnosis not present

## 2018-06-24 DIAGNOSIS — N3000 Acute cystitis without hematuria: Secondary | ICD-10-CM | POA: Diagnosis not present

## 2018-06-24 DIAGNOSIS — I10 Essential (primary) hypertension: Secondary | ICD-10-CM | POA: Diagnosis not present

## 2018-06-24 DIAGNOSIS — I69322 Dysarthria following cerebral infarction: Secondary | ICD-10-CM | POA: Diagnosis not present

## 2018-06-25 DIAGNOSIS — I69322 Dysarthria following cerebral infarction: Secondary | ICD-10-CM | POA: Diagnosis not present

## 2018-06-25 DIAGNOSIS — S72002D Fracture of unspecified part of neck of left femur, subsequent encounter for closed fracture with routine healing: Secondary | ICD-10-CM | POA: Diagnosis not present

## 2018-06-25 DIAGNOSIS — W19XXXD Unspecified fall, subsequent encounter: Secondary | ICD-10-CM | POA: Diagnosis not present

## 2018-06-25 DIAGNOSIS — Z7901 Long term (current) use of anticoagulants: Secondary | ICD-10-CM | POA: Diagnosis not present

## 2018-06-25 DIAGNOSIS — I69351 Hemiplegia and hemiparesis following cerebral infarction affecting right dominant side: Secondary | ICD-10-CM | POA: Diagnosis not present

## 2018-06-25 DIAGNOSIS — I10 Essential (primary) hypertension: Secondary | ICD-10-CM | POA: Diagnosis not present

## 2018-06-25 DIAGNOSIS — N3000 Acute cystitis without hematuria: Secondary | ICD-10-CM | POA: Diagnosis not present

## 2018-06-25 DIAGNOSIS — I69311 Memory deficit following cerebral infarction: Secondary | ICD-10-CM | POA: Diagnosis not present

## 2018-06-29 DIAGNOSIS — I69311 Memory deficit following cerebral infarction: Secondary | ICD-10-CM | POA: Diagnosis not present

## 2018-06-29 DIAGNOSIS — I69351 Hemiplegia and hemiparesis following cerebral infarction affecting right dominant side: Secondary | ICD-10-CM | POA: Diagnosis not present

## 2018-06-29 DIAGNOSIS — W19XXXD Unspecified fall, subsequent encounter: Secondary | ICD-10-CM | POA: Diagnosis not present

## 2018-06-29 DIAGNOSIS — I10 Essential (primary) hypertension: Secondary | ICD-10-CM | POA: Diagnosis not present

## 2018-06-29 DIAGNOSIS — Z7901 Long term (current) use of anticoagulants: Secondary | ICD-10-CM | POA: Diagnosis not present

## 2018-06-29 DIAGNOSIS — I69322 Dysarthria following cerebral infarction: Secondary | ICD-10-CM | POA: Diagnosis not present

## 2018-06-29 DIAGNOSIS — N3000 Acute cystitis without hematuria: Secondary | ICD-10-CM | POA: Diagnosis not present

## 2018-06-29 DIAGNOSIS — S72002D Fracture of unspecified part of neck of left femur, subsequent encounter for closed fracture with routine healing: Secondary | ICD-10-CM | POA: Diagnosis not present

## 2018-06-30 DIAGNOSIS — S72002D Fracture of unspecified part of neck of left femur, subsequent encounter for closed fracture with routine healing: Secondary | ICD-10-CM | POA: Diagnosis not present

## 2018-06-30 DIAGNOSIS — I69322 Dysarthria following cerebral infarction: Secondary | ICD-10-CM | POA: Diagnosis not present

## 2018-06-30 DIAGNOSIS — N3000 Acute cystitis without hematuria: Secondary | ICD-10-CM | POA: Diagnosis not present

## 2018-06-30 DIAGNOSIS — I69351 Hemiplegia and hemiparesis following cerebral infarction affecting right dominant side: Secondary | ICD-10-CM | POA: Diagnosis not present

## 2018-06-30 DIAGNOSIS — W19XXXD Unspecified fall, subsequent encounter: Secondary | ICD-10-CM | POA: Diagnosis not present

## 2018-06-30 DIAGNOSIS — I10 Essential (primary) hypertension: Secondary | ICD-10-CM | POA: Diagnosis not present

## 2018-06-30 DIAGNOSIS — Z7901 Long term (current) use of anticoagulants: Secondary | ICD-10-CM | POA: Diagnosis not present

## 2018-06-30 DIAGNOSIS — I69311 Memory deficit following cerebral infarction: Secondary | ICD-10-CM | POA: Diagnosis not present

## 2018-07-01 DIAGNOSIS — Z7901 Long term (current) use of anticoagulants: Secondary | ICD-10-CM | POA: Diagnosis not present

## 2018-07-01 DIAGNOSIS — I69322 Dysarthria following cerebral infarction: Secondary | ICD-10-CM | POA: Diagnosis not present

## 2018-07-01 DIAGNOSIS — I10 Essential (primary) hypertension: Secondary | ICD-10-CM | POA: Diagnosis not present

## 2018-07-01 DIAGNOSIS — N3000 Acute cystitis without hematuria: Secondary | ICD-10-CM | POA: Diagnosis not present

## 2018-07-01 DIAGNOSIS — I69351 Hemiplegia and hemiparesis following cerebral infarction affecting right dominant side: Secondary | ICD-10-CM | POA: Diagnosis not present

## 2018-07-01 DIAGNOSIS — S72002D Fracture of unspecified part of neck of left femur, subsequent encounter for closed fracture with routine healing: Secondary | ICD-10-CM | POA: Diagnosis not present

## 2018-07-01 DIAGNOSIS — I69311 Memory deficit following cerebral infarction: Secondary | ICD-10-CM | POA: Diagnosis not present

## 2018-07-01 DIAGNOSIS — W19XXXD Unspecified fall, subsequent encounter: Secondary | ICD-10-CM | POA: Diagnosis not present

## 2018-07-02 DIAGNOSIS — I69351 Hemiplegia and hemiparesis following cerebral infarction affecting right dominant side: Secondary | ICD-10-CM | POA: Diagnosis not present

## 2018-07-02 DIAGNOSIS — W19XXXD Unspecified fall, subsequent encounter: Secondary | ICD-10-CM | POA: Diagnosis not present

## 2018-07-02 DIAGNOSIS — S72002D Fracture of unspecified part of neck of left femur, subsequent encounter for closed fracture with routine healing: Secondary | ICD-10-CM | POA: Diagnosis not present

## 2018-07-02 DIAGNOSIS — I69311 Memory deficit following cerebral infarction: Secondary | ICD-10-CM | POA: Diagnosis not present

## 2018-07-02 DIAGNOSIS — Z7901 Long term (current) use of anticoagulants: Secondary | ICD-10-CM | POA: Diagnosis not present

## 2018-07-02 DIAGNOSIS — I69322 Dysarthria following cerebral infarction: Secondary | ICD-10-CM | POA: Diagnosis not present

## 2018-07-02 DIAGNOSIS — I10 Essential (primary) hypertension: Secondary | ICD-10-CM | POA: Diagnosis not present

## 2018-07-02 DIAGNOSIS — N3000 Acute cystitis without hematuria: Secondary | ICD-10-CM | POA: Diagnosis not present

## 2018-07-08 DIAGNOSIS — Z7901 Long term (current) use of anticoagulants: Secondary | ICD-10-CM | POA: Diagnosis not present

## 2018-07-08 DIAGNOSIS — I69322 Dysarthria following cerebral infarction: Secondary | ICD-10-CM | POA: Diagnosis not present

## 2018-07-08 DIAGNOSIS — I69351 Hemiplegia and hemiparesis following cerebral infarction affecting right dominant side: Secondary | ICD-10-CM | POA: Diagnosis not present

## 2018-07-08 DIAGNOSIS — N3000 Acute cystitis without hematuria: Secondary | ICD-10-CM | POA: Diagnosis not present

## 2018-07-08 DIAGNOSIS — N390 Urinary tract infection, site not specified: Secondary | ICD-10-CM | POA: Diagnosis not present

## 2018-07-08 DIAGNOSIS — I10 Essential (primary) hypertension: Secondary | ICD-10-CM | POA: Diagnosis not present

## 2018-07-08 DIAGNOSIS — Z79899 Other long term (current) drug therapy: Secondary | ICD-10-CM | POA: Diagnosis not present

## 2018-07-08 DIAGNOSIS — I69311 Memory deficit following cerebral infarction: Secondary | ICD-10-CM | POA: Diagnosis not present

## 2018-07-08 DIAGNOSIS — W19XXXD Unspecified fall, subsequent encounter: Secondary | ICD-10-CM | POA: Diagnosis not present

## 2018-07-08 DIAGNOSIS — S72002D Fracture of unspecified part of neck of left femur, subsequent encounter for closed fracture with routine healing: Secondary | ICD-10-CM | POA: Diagnosis not present

## 2018-07-10 DIAGNOSIS — W19XXXD Unspecified fall, subsequent encounter: Secondary | ICD-10-CM | POA: Diagnosis not present

## 2018-07-10 DIAGNOSIS — S72002D Fracture of unspecified part of neck of left femur, subsequent encounter for closed fracture with routine healing: Secondary | ICD-10-CM | POA: Diagnosis not present

## 2018-07-10 DIAGNOSIS — I69351 Hemiplegia and hemiparesis following cerebral infarction affecting right dominant side: Secondary | ICD-10-CM | POA: Diagnosis not present

## 2018-07-10 DIAGNOSIS — I69311 Memory deficit following cerebral infarction: Secondary | ICD-10-CM | POA: Diagnosis not present

## 2018-07-10 DIAGNOSIS — N3000 Acute cystitis without hematuria: Secondary | ICD-10-CM | POA: Diagnosis not present

## 2018-07-10 DIAGNOSIS — I69322 Dysarthria following cerebral infarction: Secondary | ICD-10-CM | POA: Diagnosis not present

## 2018-07-10 DIAGNOSIS — I10 Essential (primary) hypertension: Secondary | ICD-10-CM | POA: Diagnosis not present

## 2018-07-10 DIAGNOSIS — Z7901 Long term (current) use of anticoagulants: Secondary | ICD-10-CM | POA: Diagnosis not present

## 2018-07-19 DIAGNOSIS — S72002A Fracture of unspecified part of neck of left femur, initial encounter for closed fracture: Secondary | ICD-10-CM | POA: Diagnosis not present

## 2018-08-19 DIAGNOSIS — S72002A Fracture of unspecified part of neck of left femur, initial encounter for closed fracture: Secondary | ICD-10-CM | POA: Diagnosis not present

## 2018-08-24 DIAGNOSIS — R309 Painful micturition, unspecified: Secondary | ICD-10-CM | POA: Diagnosis not present

## 2018-08-24 DIAGNOSIS — S72142K Displaced intertrochanteric fracture of left femur, subsequent encounter for closed fracture with nonunion: Secondary | ICD-10-CM | POA: Diagnosis not present

## 2018-08-24 DIAGNOSIS — M1712 Unilateral primary osteoarthritis, left knee: Secondary | ICD-10-CM | POA: Diagnosis not present

## 2018-08-25 DIAGNOSIS — M1712 Unilateral primary osteoarthritis, left knee: Secondary | ICD-10-CM | POA: Diagnosis not present

## 2018-08-25 DIAGNOSIS — R309 Painful micturition, unspecified: Secondary | ICD-10-CM | POA: Diagnosis not present

## 2018-08-25 DIAGNOSIS — S72142K Displaced intertrochanteric fracture of left femur, subsequent encounter for closed fracture with nonunion: Secondary | ICD-10-CM | POA: Diagnosis not present

## 2018-09-11 DIAGNOSIS — S72142K Displaced intertrochanteric fracture of left femur, subsequent encounter for closed fracture with nonunion: Secondary | ICD-10-CM | POA: Diagnosis not present

## 2018-09-12 DIAGNOSIS — E039 Hypothyroidism, unspecified: Secondary | ICD-10-CM | POA: Diagnosis not present

## 2018-09-12 DIAGNOSIS — Z79899 Other long term (current) drug therapy: Secondary | ICD-10-CM | POA: Diagnosis not present

## 2018-09-12 DIAGNOSIS — E876 Hypokalemia: Secondary | ICD-10-CM | POA: Diagnosis not present

## 2018-09-14 DIAGNOSIS — L821 Other seborrheic keratosis: Secondary | ICD-10-CM | POA: Diagnosis not present

## 2018-09-14 DIAGNOSIS — L578 Other skin changes due to chronic exposure to nonionizing radiation: Secondary | ICD-10-CM | POA: Diagnosis not present

## 2018-09-14 DIAGNOSIS — D2230 Melanocytic nevi of unspecified part of face: Secondary | ICD-10-CM | POA: Diagnosis not present

## 2018-09-14 DIAGNOSIS — L820 Inflamed seborrheic keratosis: Secondary | ICD-10-CM | POA: Diagnosis not present

## 2018-09-14 DIAGNOSIS — L570 Actinic keratosis: Secondary | ICD-10-CM | POA: Diagnosis not present

## 2018-09-18 DIAGNOSIS — S72002A Fracture of unspecified part of neck of left femur, initial encounter for closed fracture: Secondary | ICD-10-CM | POA: Diagnosis not present

## 2018-10-07 DIAGNOSIS — E876 Hypokalemia: Secondary | ICD-10-CM | POA: Diagnosis not present

## 2018-10-07 DIAGNOSIS — Z79899 Other long term (current) drug therapy: Secondary | ICD-10-CM | POA: Diagnosis not present

## 2018-10-07 DIAGNOSIS — E039 Hypothyroidism, unspecified: Secondary | ICD-10-CM | POA: Diagnosis not present

## 2018-10-19 DIAGNOSIS — S72002A Fracture of unspecified part of neck of left femur, initial encounter for closed fracture: Secondary | ICD-10-CM | POA: Diagnosis not present

## 2018-10-28 DIAGNOSIS — N390 Urinary tract infection, site not specified: Secondary | ICD-10-CM | POA: Diagnosis not present

## 2018-10-28 DIAGNOSIS — Z79899 Other long term (current) drug therapy: Secondary | ICD-10-CM | POA: Diagnosis not present

## 2018-10-29 DIAGNOSIS — Z79899 Other long term (current) drug therapy: Secondary | ICD-10-CM | POA: Diagnosis not present

## 2018-10-29 DIAGNOSIS — N390 Urinary tract infection, site not specified: Secondary | ICD-10-CM | POA: Diagnosis not present

## 2018-11-02 DIAGNOSIS — R062 Wheezing: Secondary | ICD-10-CM | POA: Diagnosis not present

## 2018-11-02 DIAGNOSIS — S72142G Displaced intertrochanteric fracture of left femur, subsequent encounter for closed fracture with delayed healing: Secondary | ICD-10-CM | POA: Diagnosis not present

## 2018-11-02 DIAGNOSIS — S50311A Abrasion of right elbow, initial encounter: Secondary | ICD-10-CM | POA: Diagnosis not present

## 2018-11-19 DIAGNOSIS — S72002A Fracture of unspecified part of neck of left femur, initial encounter for closed fracture: Secondary | ICD-10-CM | POA: Diagnosis not present

## 2018-11-26 DIAGNOSIS — N390 Urinary tract infection, site not specified: Secondary | ICD-10-CM | POA: Diagnosis not present

## 2018-11-26 DIAGNOSIS — Z79899 Other long term (current) drug therapy: Secondary | ICD-10-CM | POA: Diagnosis not present

## 2018-11-27 DIAGNOSIS — J811 Chronic pulmonary edema: Secondary | ICD-10-CM | POA: Diagnosis not present

## 2018-11-27 DIAGNOSIS — Z859 Personal history of malignant neoplasm, unspecified: Secondary | ICD-10-CM | POA: Diagnosis not present

## 2018-11-27 DIAGNOSIS — I4949 Other premature depolarization: Secondary | ICD-10-CM | POA: Diagnosis not present

## 2018-11-27 DIAGNOSIS — Z7901 Long term (current) use of anticoagulants: Secondary | ICD-10-CM | POA: Diagnosis not present

## 2018-11-27 DIAGNOSIS — R918 Other nonspecific abnormal finding of lung field: Secondary | ICD-10-CM | POA: Diagnosis not present

## 2018-11-27 DIAGNOSIS — I4430 Unspecified atrioventricular block: Secondary | ICD-10-CM | POA: Diagnosis not present

## 2018-11-27 DIAGNOSIS — I10 Essential (primary) hypertension: Secondary | ICD-10-CM | POA: Diagnosis not present

## 2018-11-27 DIAGNOSIS — Z79899 Other long term (current) drug therapy: Secondary | ICD-10-CM | POA: Diagnosis not present

## 2018-11-27 DIAGNOSIS — Z86718 Personal history of other venous thrombosis and embolism: Secondary | ICD-10-CM | POA: Diagnosis not present

## 2018-11-27 DIAGNOSIS — N390 Urinary tract infection, site not specified: Secondary | ICD-10-CM | POA: Diagnosis not present

## 2018-11-27 DIAGNOSIS — Z8673 Personal history of transient ischemic attack (TIA), and cerebral infarction without residual deficits: Secondary | ICD-10-CM | POA: Diagnosis not present

## 2018-11-27 DIAGNOSIS — R600 Localized edema: Secondary | ICD-10-CM | POA: Diagnosis not present

## 2018-12-04 DIAGNOSIS — S50311D Abrasion of right elbow, subsequent encounter: Secondary | ICD-10-CM | POA: Diagnosis not present

## 2018-12-04 DIAGNOSIS — S72142D Displaced intertrochanteric fracture of left femur, subsequent encounter for closed fracture with routine healing: Secondary | ICD-10-CM | POA: Diagnosis not present

## 2018-12-05 DIAGNOSIS — E871 Hypo-osmolality and hyponatremia: Secondary | ICD-10-CM | POA: Diagnosis not present

## 2018-12-05 DIAGNOSIS — I10 Essential (primary) hypertension: Secondary | ICD-10-CM | POA: Diagnosis not present

## 2018-12-05 DIAGNOSIS — R410 Disorientation, unspecified: Secondary | ICD-10-CM | POA: Diagnosis not present

## 2018-12-05 DIAGNOSIS — R531 Weakness: Secondary | ICD-10-CM | POA: Diagnosis not present

## 2018-12-05 DIAGNOSIS — I6389 Other cerebral infarction: Secondary | ICD-10-CM | POA: Diagnosis not present

## 2018-12-05 DIAGNOSIS — R609 Edema, unspecified: Secondary | ICD-10-CM | POA: Diagnosis not present

## 2018-12-08 DIAGNOSIS — N390 Urinary tract infection, site not specified: Secondary | ICD-10-CM | POA: Diagnosis not present

## 2018-12-08 DIAGNOSIS — E039 Hypothyroidism, unspecified: Secondary | ICD-10-CM | POA: Diagnosis not present

## 2018-12-08 DIAGNOSIS — R410 Disorientation, unspecified: Secondary | ICD-10-CM | POA: Diagnosis not present

## 2018-12-08 DIAGNOSIS — R531 Weakness: Secondary | ICD-10-CM | POA: Diagnosis not present

## 2018-12-08 DIAGNOSIS — M80052D Age-related osteoporosis with current pathological fracture, left femur, subsequent encounter for fracture with routine healing: Secondary | ICD-10-CM | POA: Diagnosis not present

## 2018-12-08 DIAGNOSIS — I10 Essential (primary) hypertension: Secondary | ICD-10-CM | POA: Diagnosis not present

## 2018-12-08 DIAGNOSIS — G479 Sleep disorder, unspecified: Secondary | ICD-10-CM | POA: Diagnosis not present

## 2018-12-08 DIAGNOSIS — R262 Difficulty in walking, not elsewhere classified: Secondary | ICD-10-CM | POA: Diagnosis not present

## 2018-12-08 DIAGNOSIS — I69398 Other sequelae of cerebral infarction: Secondary | ICD-10-CM | POA: Diagnosis not present

## 2018-12-09 DIAGNOSIS — M80052D Age-related osteoporosis with current pathological fracture, left femur, subsequent encounter for fracture with routine healing: Secondary | ICD-10-CM | POA: Diagnosis not present

## 2018-12-09 DIAGNOSIS — I69398 Other sequelae of cerebral infarction: Secondary | ICD-10-CM | POA: Diagnosis not present

## 2018-12-09 DIAGNOSIS — G479 Sleep disorder, unspecified: Secondary | ICD-10-CM | POA: Diagnosis not present

## 2018-12-09 DIAGNOSIS — E039 Hypothyroidism, unspecified: Secondary | ICD-10-CM | POA: Diagnosis not present

## 2018-12-09 DIAGNOSIS — R410 Disorientation, unspecified: Secondary | ICD-10-CM | POA: Diagnosis not present

## 2018-12-09 DIAGNOSIS — I10 Essential (primary) hypertension: Secondary | ICD-10-CM | POA: Diagnosis not present

## 2018-12-09 DIAGNOSIS — R531 Weakness: Secondary | ICD-10-CM | POA: Diagnosis not present

## 2018-12-09 DIAGNOSIS — R262 Difficulty in walking, not elsewhere classified: Secondary | ICD-10-CM | POA: Diagnosis not present

## 2018-12-09 DIAGNOSIS — N390 Urinary tract infection, site not specified: Secondary | ICD-10-CM | POA: Diagnosis not present

## 2018-12-11 DIAGNOSIS — N390 Urinary tract infection, site not specified: Secondary | ICD-10-CM | POA: Diagnosis not present

## 2018-12-11 DIAGNOSIS — R410 Disorientation, unspecified: Secondary | ICD-10-CM | POA: Diagnosis not present

## 2018-12-11 DIAGNOSIS — E039 Hypothyroidism, unspecified: Secondary | ICD-10-CM | POA: Diagnosis not present

## 2018-12-11 DIAGNOSIS — R262 Difficulty in walking, not elsewhere classified: Secondary | ICD-10-CM | POA: Diagnosis not present

## 2018-12-11 DIAGNOSIS — G479 Sleep disorder, unspecified: Secondary | ICD-10-CM | POA: Diagnosis not present

## 2018-12-11 DIAGNOSIS — I10 Essential (primary) hypertension: Secondary | ICD-10-CM | POA: Diagnosis not present

## 2018-12-11 DIAGNOSIS — M80052D Age-related osteoporosis with current pathological fracture, left femur, subsequent encounter for fracture with routine healing: Secondary | ICD-10-CM | POA: Diagnosis not present

## 2018-12-11 DIAGNOSIS — R531 Weakness: Secondary | ICD-10-CM | POA: Diagnosis not present

## 2018-12-11 DIAGNOSIS — I69398 Other sequelae of cerebral infarction: Secondary | ICD-10-CM | POA: Diagnosis not present

## 2018-12-12 DIAGNOSIS — E039 Hypothyroidism, unspecified: Secondary | ICD-10-CM | POA: Diagnosis not present

## 2018-12-12 DIAGNOSIS — I69398 Other sequelae of cerebral infarction: Secondary | ICD-10-CM | POA: Diagnosis not present

## 2018-12-12 DIAGNOSIS — G479 Sleep disorder, unspecified: Secondary | ICD-10-CM | POA: Diagnosis not present

## 2018-12-12 DIAGNOSIS — R531 Weakness: Secondary | ICD-10-CM | POA: Diagnosis not present

## 2018-12-12 DIAGNOSIS — M80052D Age-related osteoporosis with current pathological fracture, left femur, subsequent encounter for fracture with routine healing: Secondary | ICD-10-CM | POA: Diagnosis not present

## 2018-12-12 DIAGNOSIS — R410 Disorientation, unspecified: Secondary | ICD-10-CM | POA: Diagnosis not present

## 2018-12-12 DIAGNOSIS — N390 Urinary tract infection, site not specified: Secondary | ICD-10-CM | POA: Diagnosis not present

## 2018-12-12 DIAGNOSIS — I10 Essential (primary) hypertension: Secondary | ICD-10-CM | POA: Diagnosis not present

## 2018-12-12 DIAGNOSIS — R262 Difficulty in walking, not elsewhere classified: Secondary | ICD-10-CM | POA: Diagnosis not present

## 2018-12-16 DIAGNOSIS — E039 Hypothyroidism, unspecified: Secondary | ICD-10-CM | POA: Diagnosis not present

## 2018-12-16 DIAGNOSIS — I69398 Other sequelae of cerebral infarction: Secondary | ICD-10-CM | POA: Diagnosis not present

## 2018-12-16 DIAGNOSIS — M80052D Age-related osteoporosis with current pathological fracture, left femur, subsequent encounter for fracture with routine healing: Secondary | ICD-10-CM | POA: Diagnosis not present

## 2018-12-16 DIAGNOSIS — R531 Weakness: Secondary | ICD-10-CM | POA: Diagnosis not present

## 2018-12-16 DIAGNOSIS — R410 Disorientation, unspecified: Secondary | ICD-10-CM | POA: Diagnosis not present

## 2018-12-16 DIAGNOSIS — R262 Difficulty in walking, not elsewhere classified: Secondary | ICD-10-CM | POA: Diagnosis not present

## 2018-12-16 DIAGNOSIS — N390 Urinary tract infection, site not specified: Secondary | ICD-10-CM | POA: Diagnosis not present

## 2018-12-16 DIAGNOSIS — G479 Sleep disorder, unspecified: Secondary | ICD-10-CM | POA: Diagnosis not present

## 2018-12-16 DIAGNOSIS — I10 Essential (primary) hypertension: Secondary | ICD-10-CM | POA: Diagnosis not present

## 2018-12-19 DIAGNOSIS — S72002A Fracture of unspecified part of neck of left femur, initial encounter for closed fracture: Secondary | ICD-10-CM | POA: Diagnosis not present

## 2018-12-21 DIAGNOSIS — R531 Weakness: Secondary | ICD-10-CM | POA: Diagnosis not present

## 2018-12-21 DIAGNOSIS — I10 Essential (primary) hypertension: Secondary | ICD-10-CM | POA: Diagnosis not present

## 2018-12-21 DIAGNOSIS — G479 Sleep disorder, unspecified: Secondary | ICD-10-CM | POA: Diagnosis not present

## 2018-12-21 DIAGNOSIS — I69398 Other sequelae of cerebral infarction: Secondary | ICD-10-CM | POA: Diagnosis not present

## 2018-12-21 DIAGNOSIS — R410 Disorientation, unspecified: Secondary | ICD-10-CM | POA: Diagnosis not present

## 2018-12-21 DIAGNOSIS — R262 Difficulty in walking, not elsewhere classified: Secondary | ICD-10-CM | POA: Diagnosis not present

## 2018-12-21 DIAGNOSIS — M80052D Age-related osteoporosis with current pathological fracture, left femur, subsequent encounter for fracture with routine healing: Secondary | ICD-10-CM | POA: Diagnosis not present

## 2018-12-21 DIAGNOSIS — N390 Urinary tract infection, site not specified: Secondary | ICD-10-CM | POA: Diagnosis not present

## 2018-12-21 DIAGNOSIS — E039 Hypothyroidism, unspecified: Secondary | ICD-10-CM | POA: Diagnosis not present

## 2018-12-23 DIAGNOSIS — G479 Sleep disorder, unspecified: Secondary | ICD-10-CM | POA: Diagnosis not present

## 2018-12-23 DIAGNOSIS — M80052D Age-related osteoporosis with current pathological fracture, left femur, subsequent encounter for fracture with routine healing: Secondary | ICD-10-CM | POA: Diagnosis not present

## 2018-12-23 DIAGNOSIS — R531 Weakness: Secondary | ICD-10-CM | POA: Diagnosis not present

## 2018-12-23 DIAGNOSIS — I10 Essential (primary) hypertension: Secondary | ICD-10-CM | POA: Diagnosis not present

## 2018-12-23 DIAGNOSIS — R410 Disorientation, unspecified: Secondary | ICD-10-CM | POA: Diagnosis not present

## 2018-12-23 DIAGNOSIS — R262 Difficulty in walking, not elsewhere classified: Secondary | ICD-10-CM | POA: Diagnosis not present

## 2018-12-23 DIAGNOSIS — I69398 Other sequelae of cerebral infarction: Secondary | ICD-10-CM | POA: Diagnosis not present

## 2018-12-23 DIAGNOSIS — N390 Urinary tract infection, site not specified: Secondary | ICD-10-CM | POA: Diagnosis not present

## 2018-12-23 DIAGNOSIS — E039 Hypothyroidism, unspecified: Secondary | ICD-10-CM | POA: Diagnosis not present

## 2018-12-25 DIAGNOSIS — R410 Disorientation, unspecified: Secondary | ICD-10-CM | POA: Diagnosis not present

## 2018-12-25 DIAGNOSIS — I69398 Other sequelae of cerebral infarction: Secondary | ICD-10-CM | POA: Diagnosis not present

## 2018-12-25 DIAGNOSIS — N390 Urinary tract infection, site not specified: Secondary | ICD-10-CM | POA: Diagnosis not present

## 2018-12-25 DIAGNOSIS — R262 Difficulty in walking, not elsewhere classified: Secondary | ICD-10-CM | POA: Diagnosis not present

## 2018-12-25 DIAGNOSIS — I10 Essential (primary) hypertension: Secondary | ICD-10-CM | POA: Diagnosis not present

## 2018-12-25 DIAGNOSIS — R531 Weakness: Secondary | ICD-10-CM | POA: Diagnosis not present

## 2018-12-25 DIAGNOSIS — G479 Sleep disorder, unspecified: Secondary | ICD-10-CM | POA: Diagnosis not present

## 2018-12-25 DIAGNOSIS — E039 Hypothyroidism, unspecified: Secondary | ICD-10-CM | POA: Diagnosis not present

## 2018-12-25 DIAGNOSIS — M80052D Age-related osteoporosis with current pathological fracture, left femur, subsequent encounter for fracture with routine healing: Secondary | ICD-10-CM | POA: Diagnosis not present

## 2018-12-29 DIAGNOSIS — M80052D Age-related osteoporosis with current pathological fracture, left femur, subsequent encounter for fracture with routine healing: Secondary | ICD-10-CM | POA: Diagnosis not present

## 2018-12-29 DIAGNOSIS — R410 Disorientation, unspecified: Secondary | ICD-10-CM | POA: Diagnosis not present

## 2018-12-29 DIAGNOSIS — I10 Essential (primary) hypertension: Secondary | ICD-10-CM | POA: Diagnosis not present

## 2018-12-29 DIAGNOSIS — G479 Sleep disorder, unspecified: Secondary | ICD-10-CM | POA: Diagnosis not present

## 2018-12-29 DIAGNOSIS — E039 Hypothyroidism, unspecified: Secondary | ICD-10-CM | POA: Diagnosis not present

## 2018-12-29 DIAGNOSIS — I69398 Other sequelae of cerebral infarction: Secondary | ICD-10-CM | POA: Diagnosis not present

## 2018-12-29 DIAGNOSIS — R531 Weakness: Secondary | ICD-10-CM | POA: Diagnosis not present

## 2018-12-29 DIAGNOSIS — R262 Difficulty in walking, not elsewhere classified: Secondary | ICD-10-CM | POA: Diagnosis not present

## 2018-12-29 DIAGNOSIS — N390 Urinary tract infection, site not specified: Secondary | ICD-10-CM | POA: Diagnosis not present

## 2018-12-31 DIAGNOSIS — G479 Sleep disorder, unspecified: Secondary | ICD-10-CM | POA: Diagnosis not present

## 2018-12-31 DIAGNOSIS — R262 Difficulty in walking, not elsewhere classified: Secondary | ICD-10-CM | POA: Diagnosis not present

## 2018-12-31 DIAGNOSIS — I69398 Other sequelae of cerebral infarction: Secondary | ICD-10-CM | POA: Diagnosis not present

## 2018-12-31 DIAGNOSIS — E039 Hypothyroidism, unspecified: Secondary | ICD-10-CM | POA: Diagnosis not present

## 2018-12-31 DIAGNOSIS — I10 Essential (primary) hypertension: Secondary | ICD-10-CM | POA: Diagnosis not present

## 2018-12-31 DIAGNOSIS — M80052D Age-related osteoporosis with current pathological fracture, left femur, subsequent encounter for fracture with routine healing: Secondary | ICD-10-CM | POA: Diagnosis not present

## 2018-12-31 DIAGNOSIS — N390 Urinary tract infection, site not specified: Secondary | ICD-10-CM | POA: Diagnosis not present

## 2018-12-31 DIAGNOSIS — R410 Disorientation, unspecified: Secondary | ICD-10-CM | POA: Diagnosis not present

## 2018-12-31 DIAGNOSIS — R531 Weakness: Secondary | ICD-10-CM | POA: Diagnosis not present

## 2019-01-02 DIAGNOSIS — N390 Urinary tract infection, site not specified: Secondary | ICD-10-CM | POA: Diagnosis not present

## 2019-01-02 DIAGNOSIS — G479 Sleep disorder, unspecified: Secondary | ICD-10-CM | POA: Diagnosis not present

## 2019-01-02 DIAGNOSIS — R531 Weakness: Secondary | ICD-10-CM | POA: Diagnosis not present

## 2019-01-02 DIAGNOSIS — M80052D Age-related osteoporosis with current pathological fracture, left femur, subsequent encounter for fracture with routine healing: Secondary | ICD-10-CM | POA: Diagnosis not present

## 2019-01-02 DIAGNOSIS — R410 Disorientation, unspecified: Secondary | ICD-10-CM | POA: Diagnosis not present

## 2019-01-02 DIAGNOSIS — R262 Difficulty in walking, not elsewhere classified: Secondary | ICD-10-CM | POA: Diagnosis not present

## 2019-01-02 DIAGNOSIS — E039 Hypothyroidism, unspecified: Secondary | ICD-10-CM | POA: Diagnosis not present

## 2019-01-02 DIAGNOSIS — I10 Essential (primary) hypertension: Secondary | ICD-10-CM | POA: Diagnosis not present

## 2019-01-02 DIAGNOSIS — I69398 Other sequelae of cerebral infarction: Secondary | ICD-10-CM | POA: Diagnosis not present

## 2019-01-19 DIAGNOSIS — S72002A Fracture of unspecified part of neck of left femur, initial encounter for closed fracture: Secondary | ICD-10-CM | POA: Diagnosis not present

## 2019-02-18 DIAGNOSIS — S72002A Fracture of unspecified part of neck of left femur, initial encounter for closed fracture: Secondary | ICD-10-CM | POA: Diagnosis not present

## 2019-02-23 DIAGNOSIS — N390 Urinary tract infection, site not specified: Secondary | ICD-10-CM | POA: Diagnosis not present

## 2019-02-23 DIAGNOSIS — F4489 Other dissociative and conversion disorders: Secondary | ICD-10-CM | POA: Diagnosis not present

## 2019-02-23 DIAGNOSIS — Z79899 Other long term (current) drug therapy: Secondary | ICD-10-CM | POA: Diagnosis not present

## 2019-03-21 DIAGNOSIS — S72002A Fracture of unspecified part of neck of left femur, initial encounter for closed fracture: Secondary | ICD-10-CM | POA: Diagnosis not present

## 2019-03-24 DIAGNOSIS — E039 Hypothyroidism, unspecified: Secondary | ICD-10-CM | POA: Diagnosis not present

## 2019-03-24 DIAGNOSIS — Z79899 Other long term (current) drug therapy: Secondary | ICD-10-CM | POA: Diagnosis not present

## 2019-04-06 ENCOUNTER — Other Ambulatory Visit: Payer: Self-pay

## 2019-04-06 ENCOUNTER — Ambulatory Visit
Admission: EM | Admit: 2019-04-06 | Discharge: 2019-04-06 | Disposition: A | Discharge status: Home / Self Care | Payer: Medicare HMO

## 2019-04-06 ENCOUNTER — Emergency Department: Payer: Medicare HMO

## 2019-04-06 ENCOUNTER — Emergency Department
Admission: EM | Admit: 2019-04-06 | Discharge: 2019-04-06 | Disposition: A | Discharge status: Home / Self Care | Payer: Medicare HMO | Attending: Emergency Medicine | Admitting: Emergency Medicine

## 2019-04-06 ENCOUNTER — Encounter: Payer: Self-pay | Admitting: Emergency Medicine

## 2019-04-06 DIAGNOSIS — N390 Urinary tract infection, site not specified: Secondary | ICD-10-CM | POA: Diagnosis not present

## 2019-04-06 DIAGNOSIS — Z7901 Long term (current) use of anticoagulants: Secondary | ICD-10-CM | POA: Insufficient documentation

## 2019-04-06 DIAGNOSIS — Z79899 Other long term (current) drug therapy: Secondary | ICD-10-CM | POA: Diagnosis not present

## 2019-04-06 DIAGNOSIS — Z85038 Personal history of other malignant neoplasm of large intestine: Secondary | ICD-10-CM | POA: Diagnosis not present

## 2019-04-06 DIAGNOSIS — R410 Disorientation, unspecified: Secondary | ICD-10-CM | POA: Diagnosis not present

## 2019-04-06 DIAGNOSIS — E039 Hypothyroidism, unspecified: Secondary | ICD-10-CM | POA: Insufficient documentation

## 2019-04-06 DIAGNOSIS — R4182 Altered mental status, unspecified: Secondary | ICD-10-CM | POA: Diagnosis not present

## 2019-04-06 DIAGNOSIS — R030 Elevated blood-pressure reading, without diagnosis of hypertension: Secondary | ICD-10-CM | POA: Diagnosis not present

## 2019-04-06 LAB — URINALYSIS, COMPLETE (UACMP) WITH MICROSCOPIC
Bacteria, UA: NONE SEEN
Bilirubin Urine: NEGATIVE
Glucose, UA: NEGATIVE mg/dL
Hgb urine dipstick: NEGATIVE
Ketones, ur: NEGATIVE mg/dL
Nitrite: NEGATIVE
Protein, ur: NEGATIVE mg/dL
Specific Gravity, Urine: 1.017 (ref 1.005–1.030)
pH: 5 (ref 5.0–8.0)

## 2019-04-06 LAB — TROPONIN I (HIGH SENSITIVITY)
Troponin I (High Sensitivity): 63 ng/L — ABNORMAL HIGH (ref <18)
Troponin I (High Sensitivity): 70 ng/L — ABNORMAL HIGH (ref <18)

## 2019-04-06 LAB — CBC
HCT: 35.7 % — ABNORMAL LOW (ref 36.0–46.0)
Hemoglobin: 12 g/dL (ref 12.0–15.0)
MCH: 33 pg (ref 26.0–34.0)
MCHC: 33.6 g/dL (ref 30.0–36.0)
MCV: 98.1 fL (ref 80.0–100.0)
Platelets: 250 10*3/uL (ref 150–400)
RBC: 3.64 MIL/uL — ABNORMAL LOW (ref 3.87–5.11)
RDW: 12.9 % (ref 11.5–15.5)
WBC: 9.4 10*3/uL (ref 4.0–10.5)
nRBC: 0 % (ref 0.0–0.2)

## 2019-04-06 LAB — COMPREHENSIVE METABOLIC PANEL
ALT: 18 U/L (ref 0–44)
AST: 24 U/L (ref 15–41)
Albumin: 3.7 g/dL (ref 3.5–5.0)
Alkaline Phosphatase: 53 U/L (ref 38–126)
Anion gap: 12 (ref 5–15)
BUN: 22 mg/dL (ref 8–23)
CO2: 28 mmol/L (ref 22–32)
Calcium: 9.1 mg/dL (ref 8.9–10.3)
Chloride: 99 mmol/L (ref 98–111)
Creatinine, Ser: 1.27 mg/dL — ABNORMAL HIGH (ref 0.44–1.00)
GFR calc Af Amer: 41 mL/min — ABNORMAL LOW (ref >60)
GFR calc non Af Amer: 36 mL/min — ABNORMAL LOW (ref >60)
Glucose, Bld: 127 mg/dL — ABNORMAL HIGH (ref 70–99)
Potassium: 3.8 mmol/L (ref 3.5–5.1)
Sodium: 139 mmol/L (ref 135–145)
Total Bilirubin: 0.7 mg/dL (ref 0.3–1.2)
Total Protein: 7.3 g/dL (ref 6.5–8.1)

## 2019-04-06 MED ORDER — CEPHALEXIN 500 MG PO CAPS: 500.0000 mg | ORAL_CAPSULE | Freq: Two times a day (BID) | ORAL | 0 refills | Status: AC

## 2019-04-06 MED ORDER — SODIUM CHLORIDE 0.9 % IV SOLN
1.0000 g | Freq: Once | INTRAVENOUS | Status: AC
  Administered 2019-04-06: 1 g via INTRAVENOUS
  Filled 2019-04-06: qty 10

## 2019-04-06 MED ORDER — LACTATED RINGERS IV BOLUS: 1000.0000 mL | Freq: Once | INTRAVENOUS | Status: DC

## 2019-04-06 NOTE — ED Notes (Signed)
Pts daughter at bedside updated on plan of care. Verbal understanding on process and lab wait time provided. TV turned on and remote given to pt for extra comfort.

## 2019-04-06 NOTE — ED Triage Notes (Signed)
First nurse note- Pt here for confusion and possible UTI per family.  NAD at this time.

## 2019-04-06 NOTE — ED Provider Notes (Signed)
[facility] Emergency Department Provider Note   ____________________________________________   First MD Initiated Contact with Patient 04/06/19 1952     (approximate)  I have reviewed the triage vital signs and the nursing notes.   HISTORY  Chief Complaint Altered Mental Status    HPI Christine [name] is a 86 y.o. female with past medical history of colon cancer status post resection and stroke who presents to the ED for altered mental status.  History is limited due to patient's confusion and majority of history is provided by her daughter.  Daughter states that the patient has been increasingly confused since yesterday.  She did not sleep much last night and seemed to be hallucinating on occasion earlier today.  Daughter states that it is not unusual for her to be confused at times, but that this has seemed more severe.  She does not have any apparent diagnosis of dementia.  Daughter states that patient has not had any recent fevers, cough, chest pain, shortness of breath, vomiting, or diarrhea.  She is concerned that she might have a UTI given this is how she has behaved on prior occasions.  Patient has not had any recent changes in her medications.  Patient herself denies any complaints at this time.        Past Medical History:  Diagnosis Date  . Cancer ([place])    colon  . Carpal tunnel syndrome   . Complication of anesthesia    hard time waking up after thyroidectomy  . DJD (degenerative joint disease)   . Family history of adverse reaction to anesthesia    daughter PONV  . Hearing loss   . Hypothyroidism   . Osteoporosis   . Wears glasses     Patient Active Problem List   Diagnosis Date Noted  . Palliative care by specialist   . Goals of care, counseling/discussion   . Bladder spasms   . CVA (cerebral vascular accident) ([place]) 05/20/2018  . Pressure injury of skin 10/25/2016  . 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 Hip  fracture ([place]) 01/09/2015    Past Surgical History:  Procedure Laterality Date  . ABDOMINAL HYSTERECTOMY    . COLON RESECTION  1995   for colon cancer  . EYE SURGERY Bilateral    cataract surgery  . INTRAMEDULLARY (IM) NAIL INTERTROCHANTERIC Right 01/09/2015   Procedure: INTRAMEDULLARY (IM) NAIL INTERTROCHANTRIC;  Surgeon: Corky Mull, MD;  Location: [facility] ORS;  Service: Orthopedics;  Laterality: Right;  . INTRAMEDULLARY (IM) NAIL INTERTROCHANTERIC Left 05/15/2018   Procedure: INTRAMEDULLARY (IM) NAIL INTERTROCHANTRIC - LEFT FEMUR;  Surgeon: Corky Mull, MD;  Location: [facility] ORS;  Service: Orthopedics;  Laterality: Left;  . Lung surgery     as a child for empyema  . OOPHORECTOMY    . THYROID SURGERY  1967   for multinodular goitre  . TOTAL HIP REVISION Right 10/24/2016   Procedure: TOTAL HIP REVISION AND HARDWARE REMOVAL;  Surgeon: Corky Mull, MD;  Location: [facility] ORS;  Service: Orthopedics;  Laterality: Right;    Prior to Admission medications   Medication Sig Start Date End Date Taking? Authorizing Provider  acetaminophen (TYLENOL) 325 MG tablet Take 650 mg by mouth every 6 (six) hours as needed for pain. 01/13/15   [provider]  amLODipine (NORVASC) 5 MG tablet Take 1 tablet (5 mg total) by mouth daily. 05/26/18   Stark Jock Jude, MD  atorvastatin (LIPITOR) 40 MG tablet Take 1 tablet (40 mg  total) by mouth daily at 6 PM. 05/26/18   Stark Jock, Jude, MD  calcium carbonate (OSCAL) 1500 (600 Ca) MG TABS tablet Take 600 mg by mouth 2 (two) times daily with a meal.    [provider]  cephALEXin (KEFLEX) 500 MG capsule Take 1 capsule (500 mg total) by mouth 2 (two) times daily for 7 days. 04/06/19 04/13/19  Blake Divine, MD  Cholecalciferol (KP VITAMIN D3) 2000 units CAPS Take 2,000 Units by mouth daily.     [provider]  cholecalciferol (VITAMIN D) 1000 units tablet Take 1,000 Units by mouth every other day.    [provider]  docusate sodium (COLACE) 100  MG capsule Take 1 capsule (100 mg total) by mouth 2 (two) times daily. 05/18/18   Max Sane, MD  hydrochlorothiazide (HYDRODIURIL) 25 MG tablet Take 1 tablet (25 mg total) by mouth daily. 05/26/18   Stark Jock Jude, MD  levothyroxine (SYNTHROID, LEVOTHROID) 112 MCG tablet Take 112 mcg by mouth daily. 10/14/17   [provider]  LUBRICANT EYE DROPS 0.4-0.3 % SOLN Place 1 drop into both eyes 3 (three) times daily as needed. For dry eyes. 07/19/16   [provider]  Melatonin 10 MG TABS Take 10 mg by mouth at bedtime.    [provider]  mirabegron ER (MYRBETRIQ) 25 MG TB24 tablet Take 1 tablet (25 mg total) by mouth daily. 05/26/18   Stark Jock Jude, MD  opium-belladonna (B&O SUPPRETTES) 16.2-60 MG suppository Place 1 suppository rectally every 12 (twelve) hours as needed for bladder spasms. 05/26/18   Stark Jock Jude, MD  pantoprazole (PROTONIX) 40 MG tablet Take 40 mg by mouth 2 (two) times daily. 02/09/18   [provider]  polyethylene glycol (MIRALAX / GLYCOLAX) packet Take 17 g by mouth daily as needed for mild constipation. 05/18/18   Max Sane, MD  potassium chloride (K-DUR) 10 MEQ tablet Take 10 mEq by mouth as needed.    [provider]  raloxifene (EVISTA) 60 MG tablet Take 60 mg by mouth daily.  09/21/16   [provider]  rivaroxaban (XARELTO) 20 MG TABS tablet Take 1 tablet (20 mg total) by mouth daily. 05/26/18   Stark Jock Jude, MD    Allergies Adhesive [tape] and Tapentadol  Family History  Problem Relation Age of Onset  . Heart disease Father   . Diabetes Sister   . Cancer Sister   . Diabetes Brother   . Cancer Brother   . Cancer Mother     Social History Social History   Tobacco Use  . Smoking status: Never Smoker  . Smokeless tobacco: Never Used  Substance Use Topics  . Alcohol use: No  . Drug use: No    Review of Systems  Constitutional: No fever/chills.  Positive for confusion. Eyes: No visual changes. ENT: No sore  throat. Cardiovascular: Denies chest pain. Respiratory: Denies shortness of breath. Gastrointestinal: No abdominal pain.  No nausea, no vomiting.  No diarrhea.  No constipation. Genitourinary: Negative for dysuria. Musculoskeletal: Negative for back pain. Skin: Negative for rash. Neurological: Negative for headaches, focal weakness or numbness.  ____________________________________________   PHYSICAL EXAM:  VITAL SIGNS: ED Triage Vitals  Enc Vitals Group     BP 04/06/19 1534 130/71     Pulse Rate 04/06/19 1534 83     Resp 04/06/19 1534 16     Temp 04/06/19 1534 97.7 F (36.5 C)     Temp Source 04/06/19 1534 Oral     SpO2 04/06/19 1534 99 %  Weight 04/06/19 1532 152 lb (68.9 kg)     Height 04/06/19 1532 4\' 11"  (1.499 m)     Head Circumference --      Peak Flow --      Pain Score 04/06/19 1532 0     Pain Loc --      Pain Edu? --      Excl. in [place]? --     Constitutional: Alert and oriented to person and place but not time. Eyes: Conjunctivae are normal. Head: Atraumatic. Nose: No congestion/rhinnorhea. Mouth/Throat: Mucous membranes are moist. Neck: Normal ROM Cardiovascular: Normal rate, regular rhythm. Grossly normal heart sounds. Respiratory: Normal respiratory effort.  No retractions. Lungs CTAB. Gastrointestinal: Soft and nontender. No distention. Genitourinary: deferred Musculoskeletal: No lower extremity tenderness nor edema. Neurologic:  Normal speech and language. No gross focal neurologic deficits are appreciated. Skin:  Skin is warm, dry and intact. No rash noted. Psychiatric: Mood and affect are normal. Speech and behavior are normal.  ____________________________________________   LABS (all labs ordered are listed, but only abnormal results are displayed)  Labs Reviewed  COMPREHENSIVE METABOLIC PANEL - Abnormal; Notable for the following components:      Result Value   Glucose, Bld 127 (*)    Creatinine, Ser 1.27 (*)    GFR calc non Af Amer 36  (*)    GFR calc Af Amer 41 (*)    All other components within normal limits  CBC - Abnormal; Notable for the following components:   RBC 3.64 (*)    HCT 35.7 (*)    All other components within normal limits  URINALYSIS, COMPLETE (UACMP) WITH MICROSCOPIC - Abnormal; Notable for the following components:   Color, Urine YELLOW (*)    APPearance HAZY (*)    Leukocytes,Ua SMALL (*)    All other components within normal limits  TROPONIN I (HIGH SENSITIVITY) - Abnormal; Notable for the following components:   Troponin I (High Sensitivity) 63 (*)    All other components within normal limits  TROPONIN I (HIGH SENSITIVITY) - Abnormal; Notable for the following components:   Troponin I (High Sensitivity) 70 (*)    All other components within normal limits  URINE CULTURE   ____________________________________________  EKG  ED ECG REPORT I, Blake Divine, the attending physician, personally viewed and interpreted this ECG.   Date: 04/07/2019  EKG Time: 15:42  Rate: 89  Rhythm: normal sinus rhythm  Axis: LAD  Intervals:left anterior fascicular block  ST&T Change: None   PROCEDURES  Procedure(s) performed (including Critical Care):  Procedures   ____________________________________________   INITIAL IMPRESSION / ASSESSMENT AND PLAN / ED COURSE       86 year old female presents to the ED with increasing confusion since yesterday with occasional hallucinations throughout the day today.  She appears overall well with no recent fevers and vital signs not appear concerning for sepsis.  She has a nonfocal neurologic exam, but does appear slightly confused and disoriented to time.  CT head was obtained and shows potential pituitary mass, although daughter states that she is aware of this and they have been following it with patient's PCP.  There is also apparent progression of prior thalamic stroke, although this does not appear acute and is unlikely to account for the patient's  symptoms.  Lab work is unremarkable, but the UA does show borderline UTI.  I have offered the daughter to initiate treatment with antibiotics here in the ED versus wait for culture results and daughter prefers to treat with antibiotics  now.  There may be an element of dementia contributing to patient's symptoms and she seems appropriate for trial of antibiotics as an outpatient.  She received initial dose of Rocephin here in the ED and I have counseled daughter to have her follow-up with patient's PCP as well as neurology.  Patient and daughter agree with plan.      ____________________________________________   FINAL CLINICAL IMPRESSION(S) / ED DIAGNOSES  Final diagnoses:  Confusion  Lower urinary tract infectious disease     ED Discharge Orders         Ordered    cephALEXin (KEFLEX) 500 MG capsule  2 times daily     04/06/19 2143           Note:  This document was prepared using Dragon voice recognition software and may include unintentional dictation errors.   Blake Divine, MD 04/07/19 [id number]

## 2019-04-06 NOTE — ED Notes (Signed)
Patient assisted to bed by this nurse. With ambulation to bed patient was short of breath. Patients daughter reports that since last night she has been hallucinating and experienced an altered mental status. On assessment patient is able to answer questions accurately but is unaware of the time of day.

## 2019-04-06 NOTE — ED Notes (Signed)
Pt stable at this time. VS WNL, pt ok to transport by vehicle

## 2019-04-06 NOTE — ED Triage Notes (Signed)
Patient reports that she has increased weakness for the last several days and has been "laying around more than normal". Also complaining of intermittent pain in legs.   Per daughter, patient started having increased confusion since last night around 7. Daughter reports patient had hallucinations all night and has talked off and on since last night. Reports this has happened in the past when her potassium has dropped. Also reports that patient has had increased swelling in legs so she has been giving her lasix at home.

## 2019-04-06 NOTE — ED Triage Notes (Signed)
Pts C/G states that pt has been hallucinating and has had altered level of consciousness since last night, provider made aware and determined will need further testing and higher level of care .

## 2019-04-07 DIAGNOSIS — E871 Hypo-osmolality and hyponatremia: Secondary | ICD-10-CM | POA: Diagnosis not present

## 2019-04-07 DIAGNOSIS — Z79899 Other long term (current) drug therapy: Secondary | ICD-10-CM | POA: Diagnosis not present

## 2019-04-08 LAB — URINE CULTURE

## 2019-04-19 DIAGNOSIS — I10 Essential (primary) hypertension: Secondary | ICD-10-CM | POA: Diagnosis not present

## 2019-04-19 DIAGNOSIS — R410 Disorientation, unspecified: Secondary | ICD-10-CM | POA: Diagnosis not present

## 2019-04-19 DIAGNOSIS — R609 Edema, unspecified: Secondary | ICD-10-CM | POA: Diagnosis not present

## 2019-04-19 DIAGNOSIS — R531 Weakness: Secondary | ICD-10-CM | POA: Diagnosis not present

## 2019-04-19 DIAGNOSIS — I6389 Other cerebral infarction: Secondary | ICD-10-CM | POA: Diagnosis not present

## 2019-04-19 DIAGNOSIS — E871 Hypo-osmolality and hyponatremia: Secondary | ICD-10-CM | POA: Diagnosis not present

## 2019-04-19 DIAGNOSIS — T148XXA Other injury of unspecified body region, initial encounter: Secondary | ICD-10-CM | POA: Diagnosis not present

## 2019-04-21 DIAGNOSIS — S72002A Fracture of unspecified part of neck of left femur, initial encounter for closed fracture: Secondary | ICD-10-CM | POA: Diagnosis not present

## 2019-04-24 ENCOUNTER — Emergency Department (HOSPITAL_COMMUNITY)
Admission: EM | Admit: 2019-04-24 | Discharge: 2019-04-24 | Disposition: A | Discharge status: Home / Self Care | Payer: Medicare HMO | Attending: Emergency Medicine | Admitting: Emergency Medicine

## 2019-04-24 ENCOUNTER — Other Ambulatory Visit: Payer: Self-pay

## 2019-04-24 ENCOUNTER — Emergency Department (HOSPITAL_COMMUNITY): Payer: Medicare HMO

## 2019-04-24 ENCOUNTER — Encounter (HOSPITAL_COMMUNITY): Payer: Self-pay | Admitting: Emergency Medicine

## 2019-04-24 DIAGNOSIS — R0602 Shortness of breath: Secondary | ICD-10-CM | POA: Diagnosis not present

## 2019-04-24 DIAGNOSIS — R4182 Altered mental status, unspecified: Secondary | ICD-10-CM | POA: Insufficient documentation

## 2019-04-24 DIAGNOSIS — R140 Abdominal distension (gaseous): Secondary | ICD-10-CM | POA: Diagnosis not present

## 2019-04-24 DIAGNOSIS — E039 Hypothyroidism, unspecified: Secondary | ICD-10-CM | POA: Insufficient documentation

## 2019-04-24 DIAGNOSIS — I4891 Unspecified atrial fibrillation: Secondary | ICD-10-CM | POA: Diagnosis not present

## 2019-04-24 DIAGNOSIS — J811 Chronic pulmonary edema: Secondary | ICD-10-CM | POA: Diagnosis not present

## 2019-04-24 DIAGNOSIS — F0390 Unspecified dementia without behavioral disturbance: Secondary | ICD-10-CM | POA: Diagnosis not present

## 2019-04-24 DIAGNOSIS — Z7901 Long term (current) use of anticoagulants: Secondary | ICD-10-CM | POA: Diagnosis not present

## 2019-04-24 DIAGNOSIS — Z79899 Other long term (current) drug therapy: Secondary | ICD-10-CM | POA: Diagnosis not present

## 2019-04-24 DIAGNOSIS — R109 Unspecified abdominal pain: Secondary | ICD-10-CM | POA: Diagnosis not present

## 2019-04-24 DIAGNOSIS — Z85038 Personal history of other malignant neoplasm of large intestine: Secondary | ICD-10-CM | POA: Insufficient documentation

## 2019-04-24 LAB — COMPREHENSIVE METABOLIC PANEL
ALT: 18 U/L (ref 0–44)
AST: 27 U/L (ref 15–41)
Albumin: 4 g/dL (ref 3.5–5.0)
Alkaline Phosphatase: 56 U/L (ref 38–126)
Anion gap: 13 (ref 5–15)
BUN: 22 mg/dL (ref 8–23)
CO2: 25 mmol/L (ref 22–32)
Calcium: 9.3 mg/dL (ref 8.9–10.3)
Chloride: 94 mmol/L — ABNORMAL LOW (ref 98–111)
Creatinine, Ser: 1.15 mg/dL — ABNORMAL HIGH (ref 0.44–1.00)
GFR calc Af Amer: 47 mL/min — ABNORMAL LOW (ref >60)
GFR calc non Af Amer: 40 mL/min — ABNORMAL LOW (ref >60)
Glucose, Bld: 111 mg/dL — ABNORMAL HIGH (ref 70–99)
Potassium: 3.4 mmol/L — ABNORMAL LOW (ref 3.5–5.1)
Sodium: 132 mmol/L — ABNORMAL LOW (ref 135–145)
Total Bilirubin: 0.6 mg/dL (ref 0.3–1.2)
Total Protein: 7.4 g/dL (ref 6.5–8.1)

## 2019-04-24 LAB — CBC WITH DIFFERENTIAL/PLATELET
Abs Immature Granulocytes: 0.03 10*3/uL (ref 0.00–0.07)
Basophils Absolute: 0.1 10*3/uL (ref 0.0–0.1)
Basophils Relative: 1 %
Eosinophils Absolute: 0.1 10*3/uL (ref 0.0–0.5)
Eosinophils Relative: 1 %
HCT: 35.7 % — ABNORMAL LOW (ref 36.0–46.0)
Hemoglobin: 12 g/dL (ref 12.0–15.0)
Immature Granulocytes: 0 %
Lymphocytes Relative: 19 %
Lymphs Abs: 1.7 10*3/uL (ref 0.7–4.0)
MCH: 33.2 pg (ref 26.0–34.0)
MCHC: 33.6 g/dL (ref 30.0–36.0)
MCV: 98.9 fL (ref 80.0–100.0)
Monocytes Absolute: 0.8 10*3/uL (ref 0.1–1.0)
Monocytes Relative: 9 %
Neutro Abs: 6.4 10*3/uL (ref 1.7–7.7)
Neutrophils Relative %: 70 %
Platelets: 242 10*3/uL (ref 150–400)
RBC: 3.61 MIL/uL — ABNORMAL LOW (ref 3.87–5.11)
RDW: 12.6 % (ref 11.5–15.5)
WBC: 9.1 10*3/uL (ref 4.0–10.5)
nRBC: 0 % (ref 0.0–0.2)

## 2019-04-24 LAB — URINALYSIS, ROUTINE W REFLEX MICROSCOPIC
Bacteria, UA: NONE SEEN
Bilirubin Urine: NEGATIVE
Glucose, UA: NEGATIVE mg/dL
Ketones, ur: NEGATIVE mg/dL
Nitrite: NEGATIVE
Protein, ur: NEGATIVE mg/dL
Specific Gravity, Urine: 1.028 (ref 1.005–1.030)
pH: 6 (ref 5.0–8.0)

## 2019-04-24 LAB — BRAIN NATRIURETIC PEPTIDE: B Natriuretic Peptide: 158 pg/mL — ABNORMAL HIGH (ref 0.0–100.0)

## 2019-04-24 MED ORDER — IOHEXOL 300 MG/ML  SOLN
80.0000 mL | Freq: Once | INTRAMUSCULAR | Status: AC | PRN
  Administered 2019-04-24: 80 mL via INTRAVENOUS

## 2019-04-24 MED ORDER — SODIUM CHLORIDE 0.9 % IV SOLN: INTRAVENOUS | Status: DC

## 2019-04-24 NOTE — ED Notes (Addendum)
Attempted to obtain urine specimen. Pt did not tolerate well. Pt yelling and clinching legs together. Unable to visualize urethra at this time. Will attempt again later. Purewick placed

## 2019-04-24 NOTE — Discharge Instructions (Addendum)
Work-up here today without any acute or significant findings.  To include CT head CT abdomen pelvis chest x-ray and lab work.  Urine sent for culture but not classic for urinary tract infection.  Would recommend contacting her primary care doctors for consideration of medication changes to help her sleep and also may help with the confusion.  Return for any new or worse symptoms.

## 2019-04-24 NOTE — ED Provider Notes (Signed)
[facility] EMERGENCY DEPARTMENT Provider Note   CSN: [id number] Arrival date & time: 04/24/19  0813     History Chief Complaint  Patient presents with  . Altered Mental Status    Christine [name] is a 86 y.o. female.  Patient brought in by her daughter who she lives with.  Increased altered mental status past few days.  But daughter states that patient has not been really sleeping well at all.  Is been increased confusion.  Had similar episode back on January 19 which they thought was secondary to a urinary tract infection seen at [facility] emergency department.  Was started on antibiotics apparently got better.  But that urine culture never really grew anything specific.  Daughter also feels that she is had some swelling of the abdomen and had some shortness of breath.  Patient primary care doctor recently started her on trazodone she is already taken melatonin to help sleep at night.  Trazodone was just started.  Not sleeping a lot better but patient's daughter says it does calm her some at night.        Past Medical History:  Diagnosis Date  . Cancer ([place])    colon  . Carpal tunnel syndrome   . Complication of anesthesia    hard time waking up after thyroidectomy  . DJD (degenerative joint disease)   . Family history of adverse reaction to anesthesia    daughter PONV  . Hearing loss   . Hypothyroidism   . Osteoporosis   . Wears glasses     Patient Active Problem List   Diagnosis Date Noted  . Palliative care by specialist   . Goals of care, counseling/discussion   . Bladder spasms   . CVA (cerebral vascular accident) ([place]) 05/20/2018  . Pressure injury of skin 10/25/2016  . 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 Hip fracture ([place]) 01/09/2015    Past Surgical History:  Procedure Laterality Date  . ABDOMINAL HYSTERECTOMY    . COLON RESECTION  1995   for colon cancer  . EYE SURGERY Bilateral    cataract surgery  . INTRAMEDULLARY (IM) NAIL  INTERTROCHANTERIC Right 01/09/2015   Procedure: INTRAMEDULLARY (IM) NAIL INTERTROCHANTRIC;  Surgeon: Corky Mull, MD;  Location: [facility] ORS;  Service: Orthopedics;  Laterality: Right;  . INTRAMEDULLARY (IM) NAIL INTERTROCHANTERIC Left 05/15/2018   Procedure: INTRAMEDULLARY (IM) NAIL INTERTROCHANTRIC - LEFT FEMUR;  Surgeon: Corky Mull, MD;  Location: [facility] ORS;  Service: Orthopedics;  Laterality: Left;  . Lung surgery     as a child for empyema  . OOPHORECTOMY    . THYROID SURGERY  1967   for multinodular goitre  . TOTAL HIP REVISION Right 10/24/2016   Procedure: TOTAL HIP REVISION AND HARDWARE REMOVAL;  Surgeon: Corky Mull, MD;  Location: [facility] ORS;  Service: Orthopedics;  Laterality: Right;     OB History   No obstetric history on file.     Family History  Problem Relation Age of Onset  . Heart disease Father   . Diabetes Sister   . Cancer Sister   . Diabetes Brother   . Cancer Brother   . Cancer Mother     Social History   Tobacco Use  . Smoking status: Never Smoker  . Smokeless tobacco: Never Used  Substance Use Topics  . Alcohol use: No  . Drug use: No    Home Medications Prior to Admission medications   Medication Sig Start Date End Date Taking? Authorizing  Provider  acetaminophen (TYLENOL) 325 MG tablet Take 650 mg by mouth every 6 (six) hours as needed for pain. 01/13/15  Yes [provider]  amLODipine (NORVASC) 5 MG tablet Take 1 tablet (5 mg total) by mouth daily. 05/26/18  Yes Ojie, Jude, MD  calcium carbonate (OSCAL) 1500 (600 Ca) MG TABS tablet Take 600 mg by mouth 2 (two) times daily with a meal.   Yes [provider]  Cholecalciferol (KP VITAMIN D3) 2000 units CAPS Take 2,000 Units by mouth daily.    Yes [provider]  docusate sodium (COLACE) 100 MG capsule Take 1 capsule (100 mg total) by mouth 2 (two) times daily. Patient taking differently: Take 100 mg by mouth daily.  05/18/18  Yes Max Sane, MD  furosemide (LASIX) 20 MG  tablet Take 20 mg by mouth daily as needed.  03/24/19  Yes [provider]  hydrochlorothiazide (HYDRODIURIL) 25 MG tablet Take 1 tablet (25 mg total) by mouth daily. 05/26/18  Yes Ojie, Jude, MD  levothyroxine (SYNTHROID) 125 MCG tablet Take 125 mcg by mouth daily. 12/21/18  Yes [provider]  LUBRICANT EYE DROPS 0.4-0.3 % SOLN Place 1 drop into both eyes 3 (three) times daily as needed. For dry eyes. 07/19/16  Yes [provider]  Melatonin 10 MG TABS Take 10 mg by mouth at bedtime.   Yes [provider]  mirabegron ER (MYRBETRIQ) 25 MG TB24 tablet Take 1 tablet (25 mg total) by mouth daily. 05/26/18  Yes Ojie, Jude, MD  pantoprazole (PROTONIX) 40 MG tablet Take 40 mg by mouth 2 (two) times daily. 02/09/18  Yes [provider]  polyethylene glycol (MIRALAX / GLYCOLAX) packet Take 17 g by mouth daily as needed for mild constipation. 05/18/18  Yes Max Sane, MD  potassium chloride (K-DUR) 10 MEQ tablet Take 10 mEq by mouth daily as needed.    Yes [provider]  raloxifene (EVISTA) 60 MG tablet Take 60 mg by mouth daily.  09/21/16  Yes [provider]  rivaroxaban (XARELTO) 20 MG TABS tablet Take 1 tablet (20 mg total) by mouth daily. 05/26/18  Yes Ojie, Jude, MD  traZODone (DESYREL) 50 MG tablet Take 50 tablets by mouth at bedtime as needed for sleep.  04/12/19  Yes [provider]    Allergies    Adhesive [tape] and Tapentadol  Review of Systems   Review of Systems  Unable to perform ROS: Dementia    Physical Exam Updated Vital Signs BP (!) 147/70   Pulse 96   Temp 98 F (36.7 C) (Oral)   Resp 15   Ht 1.6 m (5\' 3" )   Wt 65.8 kg   SpO2 99%   BMI 25.69 kg/m   Physical Exam Vitals and nursing note reviewed.  Constitutional:      General: She is not in acute distress.    Appearance: Normal appearance. She is well-developed.  HENT:     Head: Normocephalic and atraumatic.  Eyes:     Extraocular Movements:  Extraocular movements intact.     Conjunctiva/sclera: Conjunctivae normal.     Pupils: Pupils are equal, round, and reactive to light.  Cardiovascular:     Rate and Rhythm: Normal rate and regular rhythm.     Heart sounds: No murmur.  Pulmonary:     Effort: Pulmonary effort is normal. No respiratory distress.     Breath sounds: Normal breath sounds.  Abdominal:     General: There is distension.  Palpations: Abdomen is soft.     Tenderness: There is no abdominal tenderness.  Musculoskeletal:        General: Normal range of motion.     Cervical back: Normal range of motion and neck supple.  Skin:    General: Skin is warm and dry.  Neurological:     General: No focal deficit present.     Mental Status: She is alert.     ED Results / Procedures / Treatments   Labs (all labs ordered are listed, but only abnormal results are displayed) Labs Reviewed  URINALYSIS, ROUTINE W REFLEX MICROSCOPIC - Abnormal; Notable for the following components:      Result Value   Hgb urine dipstick LARGE (*)    Leukocytes,Ua TRACE (*)    All other components within normal limits  CBC WITH DIFFERENTIAL/PLATELET - Abnormal; Notable for the following components:   RBC 3.61 (*)    HCT 35.7 (*)    All other components within normal limits  COMPREHENSIVE METABOLIC PANEL - Abnormal; Notable for the following components:   Sodium 132 (*)    Potassium 3.4 (*)    Chloride 94 (*)    Glucose, Bld 111 (*)    Creatinine, Ser 1.15 (*)    GFR calc non Af Amer 40 (*)    GFR calc Af Amer 47 (*)    All other components within normal limits  BRAIN NATRIURETIC PEPTIDE - Abnormal; Notable for the following components:   B Natriuretic Peptide 158.0 (*)    All other components within normal limits  URINE CULTURE    EKG EKG Interpretation  Date/Time:  Saturday April 24 2019 08:52:24 EST Ventricular Rate:  78 PR Interval:    QRS Duration: 87 QT Interval:  392 QTC Calculation: 447 R Axis:   -23 Text  Interpretation: Atrial fibrillation Ventricular premature complex Borderline left axis deviation Confirmed by Fredia Sorrow [phone]) on 04/24/2019 9:05:57 AM   Radiology CT Head Wo Contrast  Result Date: 04/24/2019 CLINICAL DATA:  86 year old female with altered mental status. EXAM: CT HEAD WITHOUT CONTRAST TECHNIQUE: Contiguous axial images were obtained from the base of the skull through the vertex without intravenous contrast. COMPARISON:  04/06/2019 CT and prior studies FINDINGS: Brain: No evidence of acute infarction, hemorrhage, hydrocephalus or extra-axial collection. Atrophy, chronic small-vessel white matter ischemic changes, remote LEFT thalamic infarct and stable pituitary mass identified. Vascular: Carotid atherosclerotic calcifications again noted. Skull: No acute abnormality Sinuses/Orbits: No acute finding. Other: None IMPRESSION: 1. No evidence of acute intracranial abnormality. 2. Atrophy, chronic small-vessel white matter ischemic changes and remote LEFT thalamic infarct. 3. Stable pituitary mass/adenoma. Electronically Signed   By: Margarette Canada M.D.   On: 04/24/2019 11:07   CT Abdomen Pelvis W Contrast  Result Date: 04/24/2019 CLINICAL DATA:  86 year old female with abdominal pain and distension. EXAM: CT ABDOMEN AND PELVIS WITH CONTRAST TECHNIQUE: Multidetector CT imaging of the abdomen and pelvis was performed using the standard protocol following bolus administration of intravenous contrast. CONTRAST:  31mL OMNIPAQUE IOHEXOL 300 MG/ML  SOLN COMPARISON:  05/25/2018 CT and prior studies FINDINGS: Lower chest: No acute abnormality. Hepatobiliary: The liver is unremarkable. Cholelithiasis identified without CT evidence of acute cholecystitis. No biliary dilatation. Pancreas: Pancreatic cystic lesions are unchanged. No new or suspicious findings identified. Spleen: Unremarkable Adrenals/Urinary Tract: The kidneys and adrenal glands are unremarkable except for renal cysts. Artifact from  bilateral hip surgical hardware obscures the bladder. Stomach/Bowel: Stomach is within normal limits. No evidence of bowel wall thickening,  distention, or inflammatory changes. Vascular/Lymphatic: Aortic atherosclerosis. No enlarged abdominal or pelvic lymph nodes. Reproductive: Status post hysterectomy. No adnexal masses. Other: No ascites, focal collection or pneumoperitoneum. Musculoskeletal: Compression fractures of L1, L3, L4 and L5 are again identified and unchanged. Multilevel degenerative disc disease, spondylosis and facet arthropathy noted. Diffuse osteopenia is identified. RIGHT hip arthroplasty and LEFT femur surgical hardware again noted. IMPRESSION: 1. No evidence of acute abnormality. 2. Cholelithiasis without CT evidence of acute cholecystitis. 3. Osteopenia, compression fractures and multilevel degenerative changes within the lumbar spine. 4. Aortic Atherosclerosis (ICD10-I70.0). Electronically Signed   By: Margarette Canada M.D.   On: 04/24/2019 11:13   DG Chest Port 1 View  Result Date: 04/24/2019 CLINICAL DATA:  86 year old female with altered mental status EXAM: PORTABLE CHEST 1 VIEW COMPARISON:  Prior chest x-ray 05/14/2018 FINDINGS: Stable mild cardiomegaly. Atherosclerotic calcifications again noted in the transverse aorta. There is pulmonary vascular congestion bordering on mild interstitial edema. Changes are superimposed on a background of chronic bronchitic change. No focal airspace consolidation, pleural effusion or pneumothorax. Surgical changes of prior thyroidectomy. No acute osseous abnormality. IMPRESSION: 1. Pulmonary vascular congestion bordering on mild interstitial edema. 2. Stable cardiomegaly and chronic bronchitic changes. Electronically Signed   By: Jacqulynn Cadet M.D.   On: 04/24/2019 09:19    Procedures Procedures (including critical care time)  Medications Ordered in ED Medications  0.9 %  sodium chloride infusion ( Intravenous Stopped 04/24/19 1345)  iohexol  (OMNIPAQUE) 300 MG/ML solution 80 mL (80 mLs Intravenous Contrast Given 04/24/19 1038)    ED Course  I have reviewed the triage vital signs and the nursing notes.  Pertinent labs & imaging results that were available during my care of the patient were reviewed by me and considered in my medical decision making (see chart for details).    MDM Rules/Calculators/A&P                      Extensive work-up here.  Without any significant findings.  Head CT compared to the CT on January 19 without any significant changes.  Chest x-ray negative.  No evidence of pulmonary edema.  Patient's oxygen saturation on room air in the upper 90s.  Little hypertensive not hypotensive no significant tachycardia no fever.  Work-up as mentioned without acute findings also did CT abdomen and pelvis because the daughter thought there is may be fluid on the abdomen.  Patient's BNP up a little bit but not significant could have a little bit of mild pulmonary fluid but nothing distinct.  And her oxygen sats are fine.  CT abdomen without any acute findings.  Does have evidence of gallstone but no evidence of acute cholecystitis.  And patient nontender to palpation over the abdomen.  Particularly nontender right upper quadrant.  Feel that patient symptoms are mostly secondary to some dementia.  Urinalysis here without evidence of urinary tract infection.  I think also the fact that she is not sleeping well is also playing a role.  Recommended that they have the primary care doctors get involved again for medication adjustment since they are already just starting that.  Based on extensive work-up here no findings requiring admission.     Final Clinical Impression(s) / ED Diagnoses Final diagnoses:  Altered mental status, unspecified altered mental status type    Rx / DC Orders ED Discharge Orders    None       Fredia Sorrow, MD 04/24/19 1554

## 2019-04-24 NOTE — ED Triage Notes (Signed)
Pt brought in by her daughter for increased ams for the past few days. Was tx for uti Jan 19 with same symptoms which had improved. Reports she has also been swelling with sob and wheezing.

## 2019-04-28 LAB — URINE CULTURE: Culture: 40000 — AB

## 2019-04-29 ENCOUNTER — Telehealth: Payer: Self-pay | Admitting: *Deleted

## 2019-04-29 NOTE — Progress Notes (Signed)
ED Antimicrobial Stewardship Positive Culture Follow Up   Christine [name] is an 86 y.o. female who presented to [facility] on 04/24/2019 with a chief complaint of  Chief Complaint  Patient presents with  . Altered Mental Status    Recent Results (from the past 720 hour(s))  Urine culture     Status: Abnormal   Collection Time: 04/06/19  3:49 PM   Specimen: Urine, Random  Result Value Ref Range Status   Specimen Description   Final    URINE, RANDOM Performed at [facility], [address] [place], [place] [address]    Special Requests   Final    NONE Performed at [facility], [address] [place], [place] [address]    Culture MULTIPLE SPECIES PRESENT, SUGGEST RECOLLECTION (A)  Final   Report Status 04/08/2019 FINAL  Final  Urine Culture     Status: Abnormal   Collection Time: 04/24/19 12:07 PM   Specimen: Urine, Clean Catch  Result Value Ref Range Status   Specimen Description   Final    URINE, CLEAN CATCH Performed at [facility], [address] [place], [place] [address]    Special Requests   Final    NONE Performed at [facility], [address], [place], [place] [address]    Culture (A)  Final    40,000 COLONIES/mL AEROCOCCUS URINAE Standardized susceptibility testing for this organism is not available. Performed at [facility], [place] [address], [place], [place] [address]    Report Status 04/28/2019 FINAL  Final    []  Treated with N/A, organism resistant to prescribed antimicrobial [x]  Patient discharged originally without antimicrobial agent and treatment may be indicated  New antibiotic prescription: Symptom check - if not feeling better, start amoxicillin 500mg  PO BID x 5 days  ED Provider: Dondra Prader, PA-C   [place], Rande Lawman 04/29/2019, 8:29 AM Clinical Pharmacist Monday - Friday phone -  [phone] Saturday - Sunday phone - [phone]

## 2019-04-29 NOTE — Telephone Encounter (Signed)
Post ED Visit - Positive Culture Follow-up: Unsuccessful Patient Follow-up  Culture assessed and recommendations reviewed by:  []  Elenor Quinones, Pharm.D. []  Heide Guile, Pharm.D., BCPS AQ-ID []  Parks Neptune, Pharm.D., BCPS []  Alycia Rossetti, Pharm.D., BCPS []  [place], [place].D., BCPS, AAHIVP []  Legrand Como, Pharm.D., BCPS, AAHIVP []  Wynell Balloon, PharmD []  Vincenza Hews, PharmD, BCPS  Positive urine culture  [x]  Patient discharged without antimicrobial prescription and treatment is now indicated if pt is symptomatic []  Organism is resistant to prescribed ED discharge antimicrobial []  Patient with positive blood cultures   Unable to contact patient after 3 attempts, letter will be sent to address on file  Ardeen Fillers 04/29/2019, 9:02 AM

## 2019-05-13 ENCOUNTER — Telehealth: Payer: Self-pay | Admitting: Emergency Medicine

## 2019-05-19 DIAGNOSIS — S72002A Fracture of unspecified part of neck of left femur, initial encounter for closed fracture: Secondary | ICD-10-CM | POA: Diagnosis not present

## 2019-06-29 DIAGNOSIS — Z86718 Personal history of other venous thrombosis and embolism: Secondary | ICD-10-CM | POA: Diagnosis not present

## 2019-06-29 DIAGNOSIS — E039 Hypothyroidism, unspecified: Secondary | ICD-10-CM | POA: Diagnosis not present

## 2019-06-29 DIAGNOSIS — K219 Gastro-esophageal reflux disease without esophagitis: Secondary | ICD-10-CM | POA: Diagnosis not present

## 2019-06-29 DIAGNOSIS — L989 Disorder of the skin and subcutaneous tissue, unspecified: Secondary | ICD-10-CM | POA: Diagnosis not present

## 2019-06-29 DIAGNOSIS — Z0189 Encounter for other specified special examinations: Secondary | ICD-10-CM | POA: Diagnosis not present

## 2019-06-29 DIAGNOSIS — M810 Age-related osteoporosis without current pathological fracture: Secondary | ICD-10-CM | POA: Diagnosis not present

## 2019-06-29 DIAGNOSIS — I10 Essential (primary) hypertension: Secondary | ICD-10-CM | POA: Diagnosis not present

## 2019-06-29 DIAGNOSIS — C189 Malignant neoplasm of colon, unspecified: Secondary | ICD-10-CM | POA: Diagnosis not present

## 2019-06-29 DIAGNOSIS — Z85038 Personal history of other malignant neoplasm of large intestine: Secondary | ICD-10-CM | POA: Diagnosis not present

## 2019-07-13 DIAGNOSIS — L820 Inflamed seborrheic keratosis: Secondary | ICD-10-CM | POA: Diagnosis not present

## 2019-07-13 DIAGNOSIS — E039 Hypothyroidism, unspecified: Secondary | ICD-10-CM | POA: Diagnosis not present

## 2019-07-13 DIAGNOSIS — C189 Malignant neoplasm of colon, unspecified: Secondary | ICD-10-CM | POA: Diagnosis not present

## 2019-07-13 DIAGNOSIS — N3281 Overactive bladder: Secondary | ICD-10-CM | POA: Diagnosis not present

## 2019-07-13 DIAGNOSIS — N390 Urinary tract infection, site not specified: Secondary | ICD-10-CM | POA: Diagnosis not present

## 2019-07-13 DIAGNOSIS — K219 Gastro-esophageal reflux disease without esophagitis: Secondary | ICD-10-CM | POA: Diagnosis not present

## 2019-07-13 DIAGNOSIS — M810 Age-related osteoporosis without current pathological fracture: Secondary | ICD-10-CM | POA: Diagnosis not present

## 2019-07-13 DIAGNOSIS — L989 Disorder of the skin and subcutaneous tissue, unspecified: Secondary | ICD-10-CM | POA: Diagnosis not present

## 2019-07-13 DIAGNOSIS — I10 Essential (primary) hypertension: Secondary | ICD-10-CM | POA: Diagnosis not present

## 2019-07-13 DIAGNOSIS — Z86718 Personal history of other venous thrombosis and embolism: Secondary | ICD-10-CM | POA: Diagnosis not present

## 2019-07-13 DIAGNOSIS — B078 Other viral warts: Secondary | ICD-10-CM | POA: Diagnosis not present

## 2019-07-13 DIAGNOSIS — Z85038 Personal history of other malignant neoplasm of large intestine: Secondary | ICD-10-CM | POA: Diagnosis not present

## 2019-07-13 DIAGNOSIS — Z0189 Encounter for other specified special examinations: Secondary | ICD-10-CM | POA: Diagnosis not present

## 2019-07-13 DIAGNOSIS — L821 Other seborrheic keratosis: Secondary | ICD-10-CM | POA: Diagnosis not present

## 2019-07-29 DIAGNOSIS — S81802A Unspecified open wound, left lower leg, initial encounter: Secondary | ICD-10-CM | POA: Diagnosis not present

## 2019-08-01 DIAGNOSIS — K219 Gastro-esophageal reflux disease without esophagitis: Secondary | ICD-10-CM | POA: Diagnosis not present

## 2019-08-01 DIAGNOSIS — S81812D Laceration without foreign body, left lower leg, subsequent encounter: Secondary | ICD-10-CM | POA: Diagnosis not present

## 2019-08-01 DIAGNOSIS — E079 Disorder of thyroid, unspecified: Secondary | ICD-10-CM | POA: Diagnosis not present

## 2019-08-01 DIAGNOSIS — Z0001 Encounter for general adult medical examination with abnormal findings: Secondary | ICD-10-CM | POA: Diagnosis not present

## 2019-08-01 DIAGNOSIS — Z9181 History of falling: Secondary | ICD-10-CM | POA: Diagnosis not present

## 2019-08-01 DIAGNOSIS — M810 Age-related osteoporosis without current pathological fracture: Secondary | ICD-10-CM | POA: Diagnosis not present

## 2019-08-01 DIAGNOSIS — I10 Essential (primary) hypertension: Secondary | ICD-10-CM | POA: Diagnosis not present

## 2019-08-01 DIAGNOSIS — R32 Unspecified urinary incontinence: Secondary | ICD-10-CM | POA: Diagnosis not present

## 2019-08-01 DIAGNOSIS — F419 Anxiety disorder, unspecified: Secondary | ICD-10-CM | POA: Diagnosis not present

## 2019-08-04 DIAGNOSIS — S81812D Laceration without foreign body, left lower leg, subsequent encounter: Secondary | ICD-10-CM | POA: Diagnosis not present

## 2019-08-04 DIAGNOSIS — M810 Age-related osteoporosis without current pathological fracture: Secondary | ICD-10-CM | POA: Diagnosis not present

## 2019-08-04 DIAGNOSIS — Z9181 History of falling: Secondary | ICD-10-CM | POA: Diagnosis not present

## 2019-08-04 DIAGNOSIS — E079 Disorder of thyroid, unspecified: Secondary | ICD-10-CM | POA: Diagnosis not present

## 2019-08-04 DIAGNOSIS — K219 Gastro-esophageal reflux disease without esophagitis: Secondary | ICD-10-CM | POA: Diagnosis not present

## 2019-08-04 DIAGNOSIS — I10 Essential (primary) hypertension: Secondary | ICD-10-CM | POA: Diagnosis not present

## 2019-08-04 DIAGNOSIS — R32 Unspecified urinary incontinence: Secondary | ICD-10-CM | POA: Diagnosis not present

## 2019-08-04 DIAGNOSIS — F419 Anxiety disorder, unspecified: Secondary | ICD-10-CM | POA: Diagnosis not present

## 2019-08-10 DIAGNOSIS — B078 Other viral warts: Secondary | ICD-10-CM | POA: Diagnosis not present

## 2019-08-11 DIAGNOSIS — Z9181 History of falling: Secondary | ICD-10-CM | POA: Diagnosis not present

## 2019-08-11 DIAGNOSIS — I10 Essential (primary) hypertension: Secondary | ICD-10-CM | POA: Diagnosis not present

## 2019-08-11 DIAGNOSIS — M810 Age-related osteoporosis without current pathological fracture: Secondary | ICD-10-CM | POA: Diagnosis not present

## 2019-08-11 DIAGNOSIS — K219 Gastro-esophageal reflux disease without esophagitis: Secondary | ICD-10-CM | POA: Diagnosis not present

## 2019-08-11 DIAGNOSIS — R32 Unspecified urinary incontinence: Secondary | ICD-10-CM | POA: Diagnosis not present

## 2019-08-11 DIAGNOSIS — E079 Disorder of thyroid, unspecified: Secondary | ICD-10-CM | POA: Diagnosis not present

## 2019-08-11 DIAGNOSIS — F419 Anxiety disorder, unspecified: Secondary | ICD-10-CM | POA: Diagnosis not present

## 2019-08-11 DIAGNOSIS — S81812D Laceration without foreign body, left lower leg, subsequent encounter: Secondary | ICD-10-CM | POA: Diagnosis not present

## 2019-08-13 DIAGNOSIS — M810 Age-related osteoporosis without current pathological fracture: Secondary | ICD-10-CM | POA: Diagnosis not present

## 2019-08-13 DIAGNOSIS — F419 Anxiety disorder, unspecified: Secondary | ICD-10-CM | POA: Diagnosis not present

## 2019-08-13 DIAGNOSIS — Z9181 History of falling: Secondary | ICD-10-CM | POA: Diagnosis not present

## 2019-08-13 DIAGNOSIS — R32 Unspecified urinary incontinence: Secondary | ICD-10-CM | POA: Diagnosis not present

## 2019-08-13 DIAGNOSIS — I10 Essential (primary) hypertension: Secondary | ICD-10-CM | POA: Diagnosis not present

## 2019-08-13 DIAGNOSIS — S81812D Laceration without foreign body, left lower leg, subsequent encounter: Secondary | ICD-10-CM | POA: Diagnosis not present

## 2019-08-13 DIAGNOSIS — E079 Disorder of thyroid, unspecified: Secondary | ICD-10-CM | POA: Diagnosis not present

## 2019-08-13 DIAGNOSIS — K219 Gastro-esophageal reflux disease without esophagitis: Secondary | ICD-10-CM | POA: Diagnosis not present

## 2019-08-16 DIAGNOSIS — M810 Age-related osteoporosis without current pathological fracture: Secondary | ICD-10-CM | POA: Diagnosis not present

## 2019-08-16 DIAGNOSIS — E079 Disorder of thyroid, unspecified: Secondary | ICD-10-CM | POA: Diagnosis not present

## 2019-08-16 DIAGNOSIS — K219 Gastro-esophageal reflux disease without esophagitis: Secondary | ICD-10-CM | POA: Diagnosis not present

## 2019-08-16 DIAGNOSIS — Z9181 History of falling: Secondary | ICD-10-CM | POA: Diagnosis not present

## 2019-08-16 DIAGNOSIS — I10 Essential (primary) hypertension: Secondary | ICD-10-CM | POA: Diagnosis not present

## 2019-08-16 DIAGNOSIS — F419 Anxiety disorder, unspecified: Secondary | ICD-10-CM | POA: Diagnosis not present

## 2019-08-16 DIAGNOSIS — S81812D Laceration without foreign body, left lower leg, subsequent encounter: Secondary | ICD-10-CM | POA: Diagnosis not present

## 2019-08-16 DIAGNOSIS — R32 Unspecified urinary incontinence: Secondary | ICD-10-CM | POA: Diagnosis not present

## 2019-08-19 DIAGNOSIS — K219 Gastro-esophageal reflux disease without esophagitis: Secondary | ICD-10-CM | POA: Diagnosis not present

## 2019-08-19 DIAGNOSIS — F419 Anxiety disorder, unspecified: Secondary | ICD-10-CM | POA: Diagnosis not present

## 2019-08-19 DIAGNOSIS — I10 Essential (primary) hypertension: Secondary | ICD-10-CM | POA: Diagnosis not present

## 2019-08-19 DIAGNOSIS — E079 Disorder of thyroid, unspecified: Secondary | ICD-10-CM | POA: Diagnosis not present

## 2019-08-19 DIAGNOSIS — M810 Age-related osteoporosis without current pathological fracture: Secondary | ICD-10-CM | POA: Diagnosis not present

## 2019-08-19 DIAGNOSIS — R32 Unspecified urinary incontinence: Secondary | ICD-10-CM | POA: Diagnosis not present

## 2019-08-19 DIAGNOSIS — Z9181 History of falling: Secondary | ICD-10-CM | POA: Diagnosis not present

## 2019-08-19 DIAGNOSIS — S81812D Laceration without foreign body, left lower leg, subsequent encounter: Secondary | ICD-10-CM | POA: Diagnosis not present

## 2019-08-23 DIAGNOSIS — M810 Age-related osteoporosis without current pathological fracture: Secondary | ICD-10-CM | POA: Diagnosis not present

## 2019-08-23 DIAGNOSIS — R32 Unspecified urinary incontinence: Secondary | ICD-10-CM | POA: Diagnosis not present

## 2019-08-23 DIAGNOSIS — E079 Disorder of thyroid, unspecified: Secondary | ICD-10-CM | POA: Diagnosis not present

## 2019-08-23 DIAGNOSIS — F419 Anxiety disorder, unspecified: Secondary | ICD-10-CM | POA: Diagnosis not present

## 2019-08-23 DIAGNOSIS — I10 Essential (primary) hypertension: Secondary | ICD-10-CM | POA: Diagnosis not present

## 2019-08-23 DIAGNOSIS — Z9181 History of falling: Secondary | ICD-10-CM | POA: Diagnosis not present

## 2019-08-23 DIAGNOSIS — K219 Gastro-esophageal reflux disease without esophagitis: Secondary | ICD-10-CM | POA: Diagnosis not present

## 2019-08-23 DIAGNOSIS — S81812D Laceration without foreign body, left lower leg, subsequent encounter: Secondary | ICD-10-CM | POA: Diagnosis not present

## 2019-08-26 DIAGNOSIS — K219 Gastro-esophageal reflux disease without esophagitis: Secondary | ICD-10-CM | POA: Diagnosis not present

## 2019-08-26 DIAGNOSIS — F419 Anxiety disorder, unspecified: Secondary | ICD-10-CM | POA: Diagnosis not present

## 2019-08-26 DIAGNOSIS — Z9181 History of falling: Secondary | ICD-10-CM | POA: Diagnosis not present

## 2019-08-26 DIAGNOSIS — S81812D Laceration without foreign body, left lower leg, subsequent encounter: Secondary | ICD-10-CM | POA: Diagnosis not present

## 2019-08-26 DIAGNOSIS — E079 Disorder of thyroid, unspecified: Secondary | ICD-10-CM | POA: Diagnosis not present

## 2019-08-26 DIAGNOSIS — R32 Unspecified urinary incontinence: Secondary | ICD-10-CM | POA: Diagnosis not present

## 2019-08-26 DIAGNOSIS — I10 Essential (primary) hypertension: Secondary | ICD-10-CM | POA: Diagnosis not present

## 2019-08-26 DIAGNOSIS — M810 Age-related osteoporosis without current pathological fracture: Secondary | ICD-10-CM | POA: Diagnosis not present

## 2019-08-30 DIAGNOSIS — E079 Disorder of thyroid, unspecified: Secondary | ICD-10-CM | POA: Diagnosis not present

## 2019-08-30 DIAGNOSIS — Z9181 History of falling: Secondary | ICD-10-CM | POA: Diagnosis not present

## 2019-08-30 DIAGNOSIS — S81812D Laceration without foreign body, left lower leg, subsequent encounter: Secondary | ICD-10-CM | POA: Diagnosis not present

## 2019-08-30 DIAGNOSIS — M810 Age-related osteoporosis without current pathological fracture: Secondary | ICD-10-CM | POA: Diagnosis not present

## 2019-08-30 DIAGNOSIS — I10 Essential (primary) hypertension: Secondary | ICD-10-CM | POA: Diagnosis not present

## 2019-08-30 DIAGNOSIS — F419 Anxiety disorder, unspecified: Secondary | ICD-10-CM | POA: Diagnosis not present

## 2019-08-30 DIAGNOSIS — R32 Unspecified urinary incontinence: Secondary | ICD-10-CM | POA: Diagnosis not present

## 2019-08-30 DIAGNOSIS — K219 Gastro-esophageal reflux disease without esophagitis: Secondary | ICD-10-CM | POA: Diagnosis not present

## 2019-08-31 DIAGNOSIS — Z9181 History of falling: Secondary | ICD-10-CM | POA: Diagnosis not present

## 2019-08-31 DIAGNOSIS — S81812D Laceration without foreign body, left lower leg, subsequent encounter: Secondary | ICD-10-CM | POA: Diagnosis not present

## 2019-08-31 DIAGNOSIS — M810 Age-related osteoporosis without current pathological fracture: Secondary | ICD-10-CM | POA: Diagnosis not present

## 2019-08-31 DIAGNOSIS — F419 Anxiety disorder, unspecified: Secondary | ICD-10-CM | POA: Diagnosis not present

## 2019-08-31 DIAGNOSIS — R32 Unspecified urinary incontinence: Secondary | ICD-10-CM | POA: Diagnosis not present

## 2019-08-31 DIAGNOSIS — I10 Essential (primary) hypertension: Secondary | ICD-10-CM | POA: Diagnosis not present

## 2019-08-31 DIAGNOSIS — E079 Disorder of thyroid, unspecified: Secondary | ICD-10-CM | POA: Diagnosis not present

## 2019-08-31 DIAGNOSIS — K219 Gastro-esophageal reflux disease without esophagitis: Secondary | ICD-10-CM | POA: Diagnosis not present

## 2019-09-02 DIAGNOSIS — I10 Essential (primary) hypertension: Secondary | ICD-10-CM | POA: Diagnosis not present

## 2019-09-02 DIAGNOSIS — F419 Anxiety disorder, unspecified: Secondary | ICD-10-CM | POA: Diagnosis not present

## 2019-09-02 DIAGNOSIS — R32 Unspecified urinary incontinence: Secondary | ICD-10-CM | POA: Diagnosis not present

## 2019-09-02 DIAGNOSIS — S81812D Laceration without foreign body, left lower leg, subsequent encounter: Secondary | ICD-10-CM | POA: Diagnosis not present

## 2019-09-02 DIAGNOSIS — Z9181 History of falling: Secondary | ICD-10-CM | POA: Diagnosis not present

## 2019-09-02 DIAGNOSIS — K219 Gastro-esophageal reflux disease without esophagitis: Secondary | ICD-10-CM | POA: Diagnosis not present

## 2019-09-02 DIAGNOSIS — E079 Disorder of thyroid, unspecified: Secondary | ICD-10-CM | POA: Diagnosis not present

## 2019-09-02 DIAGNOSIS — M810 Age-related osteoporosis without current pathological fracture: Secondary | ICD-10-CM | POA: Diagnosis not present

## 2019-09-06 DIAGNOSIS — F419 Anxiety disorder, unspecified: Secondary | ICD-10-CM | POA: Diagnosis not present

## 2019-09-06 DIAGNOSIS — Z9181 History of falling: Secondary | ICD-10-CM | POA: Diagnosis not present

## 2019-09-06 DIAGNOSIS — K219 Gastro-esophageal reflux disease without esophagitis: Secondary | ICD-10-CM | POA: Diagnosis not present

## 2019-09-06 DIAGNOSIS — S81812D Laceration without foreign body, left lower leg, subsequent encounter: Secondary | ICD-10-CM | POA: Diagnosis not present

## 2019-09-06 DIAGNOSIS — M810 Age-related osteoporosis without current pathological fracture: Secondary | ICD-10-CM | POA: Diagnosis not present

## 2019-09-06 DIAGNOSIS — I10 Essential (primary) hypertension: Secondary | ICD-10-CM | POA: Diagnosis not present

## 2019-09-06 DIAGNOSIS — R32 Unspecified urinary incontinence: Secondary | ICD-10-CM | POA: Diagnosis not present

## 2019-09-06 DIAGNOSIS — E079 Disorder of thyroid, unspecified: Secondary | ICD-10-CM | POA: Diagnosis not present

## 2019-09-09 DIAGNOSIS — K219 Gastro-esophageal reflux disease without esophagitis: Secondary | ICD-10-CM | POA: Diagnosis not present

## 2019-09-09 DIAGNOSIS — Z9181 History of falling: Secondary | ICD-10-CM | POA: Diagnosis not present

## 2019-09-09 DIAGNOSIS — F419 Anxiety disorder, unspecified: Secondary | ICD-10-CM | POA: Diagnosis not present

## 2019-09-09 DIAGNOSIS — S81812D Laceration without foreign body, left lower leg, subsequent encounter: Secondary | ICD-10-CM | POA: Diagnosis not present

## 2019-09-09 DIAGNOSIS — E079 Disorder of thyroid, unspecified: Secondary | ICD-10-CM | POA: Diagnosis not present

## 2019-09-09 DIAGNOSIS — M810 Age-related osteoporosis without current pathological fracture: Secondary | ICD-10-CM | POA: Diagnosis not present

## 2019-09-09 DIAGNOSIS — R32 Unspecified urinary incontinence: Secondary | ICD-10-CM | POA: Diagnosis not present

## 2019-09-09 DIAGNOSIS — I10 Essential (primary) hypertension: Secondary | ICD-10-CM | POA: Diagnosis not present

## 2019-09-13 DIAGNOSIS — F419 Anxiety disorder, unspecified: Secondary | ICD-10-CM | POA: Diagnosis not present

## 2019-09-13 DIAGNOSIS — M810 Age-related osteoporosis without current pathological fracture: Secondary | ICD-10-CM | POA: Diagnosis not present

## 2019-09-13 DIAGNOSIS — S81812D Laceration without foreign body, left lower leg, subsequent encounter: Secondary | ICD-10-CM | POA: Diagnosis not present

## 2019-09-13 DIAGNOSIS — I10 Essential (primary) hypertension: Secondary | ICD-10-CM | POA: Diagnosis not present

## 2019-09-13 DIAGNOSIS — E079 Disorder of thyroid, unspecified: Secondary | ICD-10-CM | POA: Diagnosis not present

## 2019-09-13 DIAGNOSIS — R32 Unspecified urinary incontinence: Secondary | ICD-10-CM | POA: Diagnosis not present

## 2019-09-13 DIAGNOSIS — K219 Gastro-esophageal reflux disease without esophagitis: Secondary | ICD-10-CM | POA: Diagnosis not present

## 2019-09-13 DIAGNOSIS — Z9181 History of falling: Secondary | ICD-10-CM | POA: Diagnosis not present

## 2019-09-16 DIAGNOSIS — E079 Disorder of thyroid, unspecified: Secondary | ICD-10-CM | POA: Diagnosis not present

## 2019-09-16 DIAGNOSIS — S81812D Laceration without foreign body, left lower leg, subsequent encounter: Secondary | ICD-10-CM | POA: Diagnosis not present

## 2019-09-16 DIAGNOSIS — F419 Anxiety disorder, unspecified: Secondary | ICD-10-CM | POA: Diagnosis not present

## 2019-09-16 DIAGNOSIS — K219 Gastro-esophageal reflux disease without esophagitis: Secondary | ICD-10-CM | POA: Diagnosis not present

## 2019-09-16 DIAGNOSIS — M810 Age-related osteoporosis without current pathological fracture: Secondary | ICD-10-CM | POA: Diagnosis not present

## 2019-09-16 DIAGNOSIS — Z9181 History of falling: Secondary | ICD-10-CM | POA: Diagnosis not present

## 2019-09-16 DIAGNOSIS — I10 Essential (primary) hypertension: Secondary | ICD-10-CM | POA: Diagnosis not present

## 2019-09-16 DIAGNOSIS — R32 Unspecified urinary incontinence: Secondary | ICD-10-CM | POA: Diagnosis not present

## 2019-09-21 DIAGNOSIS — S81812D Laceration without foreign body, left lower leg, subsequent encounter: Secondary | ICD-10-CM | POA: Diagnosis not present

## 2019-09-21 DIAGNOSIS — M810 Age-related osteoporosis without current pathological fracture: Secondary | ICD-10-CM | POA: Diagnosis not present

## 2019-09-21 DIAGNOSIS — Z9181 History of falling: Secondary | ICD-10-CM | POA: Diagnosis not present

## 2019-09-21 DIAGNOSIS — K219 Gastro-esophageal reflux disease without esophagitis: Secondary | ICD-10-CM | POA: Diagnosis not present

## 2019-09-21 DIAGNOSIS — F419 Anxiety disorder, unspecified: Secondary | ICD-10-CM | POA: Diagnosis not present

## 2019-09-21 DIAGNOSIS — E079 Disorder of thyroid, unspecified: Secondary | ICD-10-CM | POA: Diagnosis not present

## 2019-09-21 DIAGNOSIS — I10 Essential (primary) hypertension: Secondary | ICD-10-CM | POA: Diagnosis not present

## 2019-09-21 DIAGNOSIS — R32 Unspecified urinary incontinence: Secondary | ICD-10-CM | POA: Diagnosis not present

## 2019-09-23 DIAGNOSIS — Z9181 History of falling: Secondary | ICD-10-CM | POA: Diagnosis not present

## 2019-09-23 DIAGNOSIS — S81812D Laceration without foreign body, left lower leg, subsequent encounter: Secondary | ICD-10-CM | POA: Diagnosis not present

## 2019-09-23 DIAGNOSIS — F419 Anxiety disorder, unspecified: Secondary | ICD-10-CM | POA: Diagnosis not present

## 2019-09-23 DIAGNOSIS — E079 Disorder of thyroid, unspecified: Secondary | ICD-10-CM | POA: Diagnosis not present

## 2019-09-23 DIAGNOSIS — R32 Unspecified urinary incontinence: Secondary | ICD-10-CM | POA: Diagnosis not present

## 2019-09-23 DIAGNOSIS — M810 Age-related osteoporosis without current pathological fracture: Secondary | ICD-10-CM | POA: Diagnosis not present

## 2019-09-23 DIAGNOSIS — I10 Essential (primary) hypertension: Secondary | ICD-10-CM | POA: Diagnosis not present

## 2019-09-23 DIAGNOSIS — K219 Gastro-esophageal reflux disease without esophagitis: Secondary | ICD-10-CM | POA: Diagnosis not present

## 2019-09-27 DIAGNOSIS — I10 Essential (primary) hypertension: Secondary | ICD-10-CM | POA: Diagnosis not present

## 2019-09-27 DIAGNOSIS — S81812D Laceration without foreign body, left lower leg, subsequent encounter: Secondary | ICD-10-CM | POA: Diagnosis not present

## 2019-09-27 DIAGNOSIS — Z9181 History of falling: Secondary | ICD-10-CM | POA: Diagnosis not present

## 2019-09-27 DIAGNOSIS — K219 Gastro-esophageal reflux disease without esophagitis: Secondary | ICD-10-CM | POA: Diagnosis not present

## 2019-09-27 DIAGNOSIS — F419 Anxiety disorder, unspecified: Secondary | ICD-10-CM | POA: Diagnosis not present

## 2019-09-27 DIAGNOSIS — R32 Unspecified urinary incontinence: Secondary | ICD-10-CM | POA: Diagnosis not present

## 2019-09-27 DIAGNOSIS — E079 Disorder of thyroid, unspecified: Secondary | ICD-10-CM | POA: Diagnosis not present

## 2019-09-27 DIAGNOSIS — M810 Age-related osteoporosis without current pathological fracture: Secondary | ICD-10-CM | POA: Diagnosis not present

## 2019-09-30 DIAGNOSIS — R32 Unspecified urinary incontinence: Secondary | ICD-10-CM | POA: Diagnosis not present

## 2019-09-30 DIAGNOSIS — I10 Essential (primary) hypertension: Secondary | ICD-10-CM | POA: Diagnosis not present

## 2019-09-30 DIAGNOSIS — Z9181 History of falling: Secondary | ICD-10-CM | POA: Diagnosis not present

## 2019-09-30 DIAGNOSIS — S81812D Laceration without foreign body, left lower leg, subsequent encounter: Secondary | ICD-10-CM | POA: Diagnosis not present

## 2019-09-30 DIAGNOSIS — E079 Disorder of thyroid, unspecified: Secondary | ICD-10-CM | POA: Diagnosis not present

## 2019-09-30 DIAGNOSIS — M810 Age-related osteoporosis without current pathological fracture: Secondary | ICD-10-CM | POA: Diagnosis not present

## 2019-09-30 DIAGNOSIS — K219 Gastro-esophageal reflux disease without esophagitis: Secondary | ICD-10-CM | POA: Diagnosis not present

## 2019-09-30 DIAGNOSIS — F419 Anxiety disorder, unspecified: Secondary | ICD-10-CM | POA: Diagnosis not present

## 2019-10-02 DIAGNOSIS — S81812D Laceration without foreign body, left lower leg, subsequent encounter: Secondary | ICD-10-CM | POA: Diagnosis not present

## 2019-10-02 DIAGNOSIS — F419 Anxiety disorder, unspecified: Secondary | ICD-10-CM | POA: Diagnosis not present

## 2019-10-02 DIAGNOSIS — M810 Age-related osteoporosis without current pathological fracture: Secondary | ICD-10-CM | POA: Diagnosis not present

## 2019-10-02 DIAGNOSIS — R32 Unspecified urinary incontinence: Secondary | ICD-10-CM | POA: Diagnosis not present

## 2019-10-02 DIAGNOSIS — K219 Gastro-esophageal reflux disease without esophagitis: Secondary | ICD-10-CM | POA: Diagnosis not present

## 2019-10-02 DIAGNOSIS — Z9181 History of falling: Secondary | ICD-10-CM | POA: Diagnosis not present

## 2019-10-02 DIAGNOSIS — E079 Disorder of thyroid, unspecified: Secondary | ICD-10-CM | POA: Diagnosis not present

## 2019-10-02 DIAGNOSIS — I10 Essential (primary) hypertension: Secondary | ICD-10-CM | POA: Diagnosis not present

## 2019-10-05 DIAGNOSIS — E079 Disorder of thyroid, unspecified: Secondary | ICD-10-CM | POA: Diagnosis not present

## 2019-10-05 DIAGNOSIS — Z9181 History of falling: Secondary | ICD-10-CM | POA: Diagnosis not present

## 2019-10-05 DIAGNOSIS — S81812D Laceration without foreign body, left lower leg, subsequent encounter: Secondary | ICD-10-CM | POA: Diagnosis not present

## 2019-10-05 DIAGNOSIS — R32 Unspecified urinary incontinence: Secondary | ICD-10-CM | POA: Diagnosis not present

## 2019-10-05 DIAGNOSIS — I10 Essential (primary) hypertension: Secondary | ICD-10-CM | POA: Diagnosis not present

## 2019-10-05 DIAGNOSIS — F419 Anxiety disorder, unspecified: Secondary | ICD-10-CM | POA: Diagnosis not present

## 2019-10-05 DIAGNOSIS — M810 Age-related osteoporosis without current pathological fracture: Secondary | ICD-10-CM | POA: Diagnosis not present

## 2019-10-05 DIAGNOSIS — K219 Gastro-esophageal reflux disease without esophagitis: Secondary | ICD-10-CM | POA: Diagnosis not present

## 2019-10-06 DIAGNOSIS — Z9181 History of falling: Secondary | ICD-10-CM | POA: Diagnosis not present

## 2019-10-06 DIAGNOSIS — S81812D Laceration without foreign body, left lower leg, subsequent encounter: Secondary | ICD-10-CM | POA: Diagnosis not present

## 2019-10-06 DIAGNOSIS — M810 Age-related osteoporosis without current pathological fracture: Secondary | ICD-10-CM | POA: Diagnosis not present

## 2019-10-06 DIAGNOSIS — K219 Gastro-esophageal reflux disease without esophagitis: Secondary | ICD-10-CM | POA: Diagnosis not present

## 2019-10-06 DIAGNOSIS — R32 Unspecified urinary incontinence: Secondary | ICD-10-CM | POA: Diagnosis not present

## 2019-10-06 DIAGNOSIS — E079 Disorder of thyroid, unspecified: Secondary | ICD-10-CM | POA: Diagnosis not present

## 2019-10-06 DIAGNOSIS — I10 Essential (primary) hypertension: Secondary | ICD-10-CM | POA: Diagnosis not present

## 2019-10-06 DIAGNOSIS — F419 Anxiety disorder, unspecified: Secondary | ICD-10-CM | POA: Diagnosis not present

## 2019-10-08 DIAGNOSIS — K219 Gastro-esophageal reflux disease without esophagitis: Secondary | ICD-10-CM | POA: Diagnosis not present

## 2019-10-08 DIAGNOSIS — M810 Age-related osteoporosis without current pathological fracture: Secondary | ICD-10-CM | POA: Diagnosis not present

## 2019-10-08 DIAGNOSIS — S81812D Laceration without foreign body, left lower leg, subsequent encounter: Secondary | ICD-10-CM | POA: Diagnosis not present

## 2019-10-08 DIAGNOSIS — F419 Anxiety disorder, unspecified: Secondary | ICD-10-CM | POA: Diagnosis not present

## 2019-10-08 DIAGNOSIS — R32 Unspecified urinary incontinence: Secondary | ICD-10-CM | POA: Diagnosis not present

## 2019-10-08 DIAGNOSIS — E079 Disorder of thyroid, unspecified: Secondary | ICD-10-CM | POA: Diagnosis not present

## 2019-10-08 DIAGNOSIS — I10 Essential (primary) hypertension: Secondary | ICD-10-CM | POA: Diagnosis not present

## 2019-10-08 DIAGNOSIS — Z9181 History of falling: Secondary | ICD-10-CM | POA: Diagnosis not present

## 2019-10-11 DIAGNOSIS — I10 Essential (primary) hypertension: Secondary | ICD-10-CM | POA: Diagnosis not present

## 2019-10-11 DIAGNOSIS — E079 Disorder of thyroid, unspecified: Secondary | ICD-10-CM | POA: Diagnosis not present

## 2019-10-11 DIAGNOSIS — M810 Age-related osteoporosis without current pathological fracture: Secondary | ICD-10-CM | POA: Diagnosis not present

## 2019-10-11 DIAGNOSIS — F419 Anxiety disorder, unspecified: Secondary | ICD-10-CM | POA: Diagnosis not present

## 2019-10-11 DIAGNOSIS — Z9181 History of falling: Secondary | ICD-10-CM | POA: Diagnosis not present

## 2019-10-11 DIAGNOSIS — R32 Unspecified urinary incontinence: Secondary | ICD-10-CM | POA: Diagnosis not present

## 2019-10-11 DIAGNOSIS — S81812D Laceration without foreign body, left lower leg, subsequent encounter: Secondary | ICD-10-CM | POA: Diagnosis not present

## 2019-10-11 DIAGNOSIS — K219 Gastro-esophageal reflux disease without esophagitis: Secondary | ICD-10-CM | POA: Diagnosis not present

## 2019-10-15 DIAGNOSIS — I10 Essential (primary) hypertension: Secondary | ICD-10-CM | POA: Diagnosis not present

## 2019-10-15 DIAGNOSIS — S81812D Laceration without foreign body, left lower leg, subsequent encounter: Secondary | ICD-10-CM | POA: Diagnosis not present

## 2019-10-15 DIAGNOSIS — K219 Gastro-esophageal reflux disease without esophagitis: Secondary | ICD-10-CM | POA: Diagnosis not present

## 2019-10-15 DIAGNOSIS — Z9181 History of falling: Secondary | ICD-10-CM | POA: Diagnosis not present

## 2019-10-15 DIAGNOSIS — M810 Age-related osteoporosis without current pathological fracture: Secondary | ICD-10-CM | POA: Diagnosis not present

## 2019-10-15 DIAGNOSIS — R32 Unspecified urinary incontinence: Secondary | ICD-10-CM | POA: Diagnosis not present

## 2019-10-15 DIAGNOSIS — F419 Anxiety disorder, unspecified: Secondary | ICD-10-CM | POA: Diagnosis not present

## 2019-10-15 DIAGNOSIS — E079 Disorder of thyroid, unspecified: Secondary | ICD-10-CM | POA: Diagnosis not present

## 2019-10-19 DIAGNOSIS — R32 Unspecified urinary incontinence: Secondary | ICD-10-CM | POA: Diagnosis not present

## 2019-10-19 DIAGNOSIS — I10 Essential (primary) hypertension: Secondary | ICD-10-CM | POA: Diagnosis not present

## 2019-10-19 DIAGNOSIS — E079 Disorder of thyroid, unspecified: Secondary | ICD-10-CM | POA: Diagnosis not present

## 2019-10-19 DIAGNOSIS — S81812D Laceration without foreign body, left lower leg, subsequent encounter: Secondary | ICD-10-CM | POA: Diagnosis not present

## 2019-10-19 DIAGNOSIS — F419 Anxiety disorder, unspecified: Secondary | ICD-10-CM | POA: Diagnosis not present

## 2019-10-19 DIAGNOSIS — K219 Gastro-esophageal reflux disease without esophagitis: Secondary | ICD-10-CM | POA: Diagnosis not present

## 2019-10-19 DIAGNOSIS — M810 Age-related osteoporosis without current pathological fracture: Secondary | ICD-10-CM | POA: Diagnosis not present

## 2019-10-19 DIAGNOSIS — Z9181 History of falling: Secondary | ICD-10-CM | POA: Diagnosis not present

## 2019-10-22 DIAGNOSIS — Z9181 History of falling: Secondary | ICD-10-CM | POA: Diagnosis not present

## 2019-10-22 DIAGNOSIS — K219 Gastro-esophageal reflux disease without esophagitis: Secondary | ICD-10-CM | POA: Diagnosis not present

## 2019-10-22 DIAGNOSIS — M810 Age-related osteoporosis without current pathological fracture: Secondary | ICD-10-CM | POA: Diagnosis not present

## 2019-10-22 DIAGNOSIS — S81812D Laceration without foreign body, left lower leg, subsequent encounter: Secondary | ICD-10-CM | POA: Diagnosis not present

## 2019-10-22 DIAGNOSIS — I10 Essential (primary) hypertension: Secondary | ICD-10-CM | POA: Diagnosis not present

## 2019-10-22 DIAGNOSIS — F419 Anxiety disorder, unspecified: Secondary | ICD-10-CM | POA: Diagnosis not present

## 2019-10-22 DIAGNOSIS — E079 Disorder of thyroid, unspecified: Secondary | ICD-10-CM | POA: Diagnosis not present

## 2019-10-22 DIAGNOSIS — R32 Unspecified urinary incontinence: Secondary | ICD-10-CM | POA: Diagnosis not present

## 2019-10-26 DIAGNOSIS — R32 Unspecified urinary incontinence: Secondary | ICD-10-CM | POA: Diagnosis not present

## 2019-10-26 DIAGNOSIS — Z9181 History of falling: Secondary | ICD-10-CM | POA: Diagnosis not present

## 2019-10-26 DIAGNOSIS — S81812D Laceration without foreign body, left lower leg, subsequent encounter: Secondary | ICD-10-CM | POA: Diagnosis not present

## 2019-10-26 DIAGNOSIS — M810 Age-related osteoporosis without current pathological fracture: Secondary | ICD-10-CM | POA: Diagnosis not present

## 2019-10-26 DIAGNOSIS — E079 Disorder of thyroid, unspecified: Secondary | ICD-10-CM | POA: Diagnosis not present

## 2019-10-26 DIAGNOSIS — F419 Anxiety disorder, unspecified: Secondary | ICD-10-CM | POA: Diagnosis not present

## 2019-10-26 DIAGNOSIS — K219 Gastro-esophageal reflux disease without esophagitis: Secondary | ICD-10-CM | POA: Diagnosis not present

## 2019-10-26 DIAGNOSIS — I10 Essential (primary) hypertension: Secondary | ICD-10-CM | POA: Diagnosis not present

## 2019-10-29 DIAGNOSIS — I10 Essential (primary) hypertension: Secondary | ICD-10-CM | POA: Diagnosis not present

## 2019-10-29 DIAGNOSIS — R32 Unspecified urinary incontinence: Secondary | ICD-10-CM | POA: Diagnosis not present

## 2019-10-29 DIAGNOSIS — F419 Anxiety disorder, unspecified: Secondary | ICD-10-CM | POA: Diagnosis not present

## 2019-10-29 DIAGNOSIS — E079 Disorder of thyroid, unspecified: Secondary | ICD-10-CM | POA: Diagnosis not present

## 2019-10-29 DIAGNOSIS — Z9181 History of falling: Secondary | ICD-10-CM | POA: Diagnosis not present

## 2019-10-29 DIAGNOSIS — S81812D Laceration without foreign body, left lower leg, subsequent encounter: Secondary | ICD-10-CM | POA: Diagnosis not present

## 2019-10-29 DIAGNOSIS — K219 Gastro-esophageal reflux disease without esophagitis: Secondary | ICD-10-CM | POA: Diagnosis not present

## 2019-10-29 DIAGNOSIS — M810 Age-related osteoporosis without current pathological fracture: Secondary | ICD-10-CM | POA: Diagnosis not present

## 2019-10-30 DIAGNOSIS — I10 Essential (primary) hypertension: Secondary | ICD-10-CM | POA: Diagnosis not present

## 2019-10-30 DIAGNOSIS — M810 Age-related osteoporosis without current pathological fracture: Secondary | ICD-10-CM | POA: Diagnosis not present

## 2019-10-30 DIAGNOSIS — Z9181 History of falling: Secondary | ICD-10-CM | POA: Diagnosis not present

## 2019-10-30 DIAGNOSIS — K219 Gastro-esophageal reflux disease without esophagitis: Secondary | ICD-10-CM | POA: Diagnosis not present

## 2019-10-30 DIAGNOSIS — R32 Unspecified urinary incontinence: Secondary | ICD-10-CM | POA: Diagnosis not present

## 2019-10-30 DIAGNOSIS — S81812D Laceration without foreign body, left lower leg, subsequent encounter: Secondary | ICD-10-CM | POA: Diagnosis not present

## 2019-10-30 DIAGNOSIS — E079 Disorder of thyroid, unspecified: Secondary | ICD-10-CM | POA: Diagnosis not present

## 2019-10-30 DIAGNOSIS — F419 Anxiety disorder, unspecified: Secondary | ICD-10-CM | POA: Diagnosis not present

## 2019-11-02 DIAGNOSIS — I10 Essential (primary) hypertension: Secondary | ICD-10-CM | POA: Diagnosis not present

## 2019-11-02 DIAGNOSIS — M810 Age-related osteoporosis without current pathological fracture: Secondary | ICD-10-CM | POA: Diagnosis not present

## 2019-11-02 DIAGNOSIS — F419 Anxiety disorder, unspecified: Secondary | ICD-10-CM | POA: Diagnosis not present

## 2019-11-02 DIAGNOSIS — E079 Disorder of thyroid, unspecified: Secondary | ICD-10-CM | POA: Diagnosis not present

## 2019-11-02 DIAGNOSIS — K219 Gastro-esophageal reflux disease without esophagitis: Secondary | ICD-10-CM | POA: Diagnosis not present

## 2019-11-02 DIAGNOSIS — R32 Unspecified urinary incontinence: Secondary | ICD-10-CM | POA: Diagnosis not present

## 2019-11-02 DIAGNOSIS — S81812D Laceration without foreign body, left lower leg, subsequent encounter: Secondary | ICD-10-CM | POA: Diagnosis not present

## 2019-11-02 DIAGNOSIS — Z9181 History of falling: Secondary | ICD-10-CM | POA: Diagnosis not present

## 2019-11-03 DIAGNOSIS — R32 Unspecified urinary incontinence: Secondary | ICD-10-CM | POA: Diagnosis not present

## 2019-11-03 DIAGNOSIS — S81812D Laceration without foreign body, left lower leg, subsequent encounter: Secondary | ICD-10-CM | POA: Diagnosis not present

## 2019-11-03 DIAGNOSIS — F419 Anxiety disorder, unspecified: Secondary | ICD-10-CM | POA: Diagnosis not present

## 2019-11-03 DIAGNOSIS — I10 Essential (primary) hypertension: Secondary | ICD-10-CM | POA: Diagnosis not present

## 2019-11-03 DIAGNOSIS — K219 Gastro-esophageal reflux disease without esophagitis: Secondary | ICD-10-CM | POA: Diagnosis not present

## 2019-11-03 DIAGNOSIS — E079 Disorder of thyroid, unspecified: Secondary | ICD-10-CM | POA: Diagnosis not present

## 2019-11-03 DIAGNOSIS — M810 Age-related osteoporosis without current pathological fracture: Secondary | ICD-10-CM | POA: Diagnosis not present

## 2019-11-03 DIAGNOSIS — Z9181 History of falling: Secondary | ICD-10-CM | POA: Diagnosis not present

## 2019-11-09 DIAGNOSIS — Z9181 History of falling: Secondary | ICD-10-CM | POA: Diagnosis not present

## 2019-11-09 DIAGNOSIS — R32 Unspecified urinary incontinence: Secondary | ICD-10-CM | POA: Diagnosis not present

## 2019-11-09 DIAGNOSIS — F419 Anxiety disorder, unspecified: Secondary | ICD-10-CM | POA: Diagnosis not present

## 2019-11-09 DIAGNOSIS — K219 Gastro-esophageal reflux disease without esophagitis: Secondary | ICD-10-CM | POA: Diagnosis not present

## 2019-11-09 DIAGNOSIS — E079 Disorder of thyroid, unspecified: Secondary | ICD-10-CM | POA: Diagnosis not present

## 2019-11-09 DIAGNOSIS — S81812D Laceration without foreign body, left lower leg, subsequent encounter: Secondary | ICD-10-CM | POA: Diagnosis not present

## 2019-11-09 DIAGNOSIS — M810 Age-related osteoporosis without current pathological fracture: Secondary | ICD-10-CM | POA: Diagnosis not present

## 2019-11-09 DIAGNOSIS — I10 Essential (primary) hypertension: Secondary | ICD-10-CM | POA: Diagnosis not present

## 2019-11-12 DIAGNOSIS — S81812D Laceration without foreign body, left lower leg, subsequent encounter: Secondary | ICD-10-CM | POA: Diagnosis not present

## 2019-11-12 DIAGNOSIS — F419 Anxiety disorder, unspecified: Secondary | ICD-10-CM | POA: Diagnosis not present

## 2019-11-12 DIAGNOSIS — Z9181 History of falling: Secondary | ICD-10-CM | POA: Diagnosis not present

## 2019-11-12 DIAGNOSIS — E079 Disorder of thyroid, unspecified: Secondary | ICD-10-CM | POA: Diagnosis not present

## 2019-11-12 DIAGNOSIS — I10 Essential (primary) hypertension: Secondary | ICD-10-CM | POA: Diagnosis not present

## 2019-11-12 DIAGNOSIS — R32 Unspecified urinary incontinence: Secondary | ICD-10-CM | POA: Diagnosis not present

## 2019-11-12 DIAGNOSIS — K219 Gastro-esophageal reflux disease without esophagitis: Secondary | ICD-10-CM | POA: Diagnosis not present

## 2019-11-12 DIAGNOSIS — M810 Age-related osteoporosis without current pathological fracture: Secondary | ICD-10-CM | POA: Diagnosis not present

## 2019-11-16 DIAGNOSIS — Z9181 History of falling: Secondary | ICD-10-CM | POA: Diagnosis not present

## 2019-11-16 DIAGNOSIS — M810 Age-related osteoporosis without current pathological fracture: Secondary | ICD-10-CM | POA: Diagnosis not present

## 2019-11-16 DIAGNOSIS — E079 Disorder of thyroid, unspecified: Secondary | ICD-10-CM | POA: Diagnosis not present

## 2019-11-16 DIAGNOSIS — S81812D Laceration without foreign body, left lower leg, subsequent encounter: Secondary | ICD-10-CM | POA: Diagnosis not present

## 2019-11-16 DIAGNOSIS — R32 Unspecified urinary incontinence: Secondary | ICD-10-CM | POA: Diagnosis not present

## 2019-11-16 DIAGNOSIS — I10 Essential (primary) hypertension: Secondary | ICD-10-CM | POA: Diagnosis not present

## 2019-11-16 DIAGNOSIS — F419 Anxiety disorder, unspecified: Secondary | ICD-10-CM | POA: Diagnosis not present

## 2019-11-16 DIAGNOSIS — K219 Gastro-esophageal reflux disease without esophagitis: Secondary | ICD-10-CM | POA: Diagnosis not present

## 2019-11-17 DIAGNOSIS — E079 Disorder of thyroid, unspecified: Secondary | ICD-10-CM | POA: Diagnosis not present

## 2019-11-17 DIAGNOSIS — S81812D Laceration without foreign body, left lower leg, subsequent encounter: Secondary | ICD-10-CM | POA: Diagnosis not present

## 2019-11-17 DIAGNOSIS — I10 Essential (primary) hypertension: Secondary | ICD-10-CM | POA: Diagnosis not present

## 2019-11-17 DIAGNOSIS — F419 Anxiety disorder, unspecified: Secondary | ICD-10-CM | POA: Diagnosis not present

## 2019-11-17 DIAGNOSIS — R32 Unspecified urinary incontinence: Secondary | ICD-10-CM | POA: Diagnosis not present

## 2019-11-17 DIAGNOSIS — Z9181 History of falling: Secondary | ICD-10-CM | POA: Diagnosis not present

## 2019-11-17 DIAGNOSIS — M810 Age-related osteoporosis without current pathological fracture: Secondary | ICD-10-CM | POA: Diagnosis not present

## 2019-11-17 DIAGNOSIS — K219 Gastro-esophageal reflux disease without esophagitis: Secondary | ICD-10-CM | POA: Diagnosis not present

## 2019-11-19 DIAGNOSIS — I10 Essential (primary) hypertension: Secondary | ICD-10-CM | POA: Diagnosis not present

## 2019-11-19 DIAGNOSIS — M810 Age-related osteoporosis without current pathological fracture: Secondary | ICD-10-CM | POA: Diagnosis not present

## 2019-11-19 DIAGNOSIS — R32 Unspecified urinary incontinence: Secondary | ICD-10-CM | POA: Diagnosis not present

## 2019-11-19 DIAGNOSIS — Z9181 History of falling: Secondary | ICD-10-CM | POA: Diagnosis not present

## 2019-11-19 DIAGNOSIS — F419 Anxiety disorder, unspecified: Secondary | ICD-10-CM | POA: Diagnosis not present

## 2019-11-19 DIAGNOSIS — S81812D Laceration without foreign body, left lower leg, subsequent encounter: Secondary | ICD-10-CM | POA: Diagnosis not present

## 2019-11-19 DIAGNOSIS — E079 Disorder of thyroid, unspecified: Secondary | ICD-10-CM | POA: Diagnosis not present

## 2019-11-19 DIAGNOSIS — K219 Gastro-esophageal reflux disease without esophagitis: Secondary | ICD-10-CM | POA: Diagnosis not present

## 2019-11-25 DIAGNOSIS — R32 Unspecified urinary incontinence: Secondary | ICD-10-CM | POA: Diagnosis not present

## 2019-11-25 DIAGNOSIS — I10 Essential (primary) hypertension: Secondary | ICD-10-CM | POA: Diagnosis not present

## 2019-11-25 DIAGNOSIS — Z9181 History of falling: Secondary | ICD-10-CM | POA: Diagnosis not present

## 2019-11-25 DIAGNOSIS — S81812D Laceration without foreign body, left lower leg, subsequent encounter: Secondary | ICD-10-CM | POA: Diagnosis not present

## 2019-11-25 DIAGNOSIS — K219 Gastro-esophageal reflux disease without esophagitis: Secondary | ICD-10-CM | POA: Diagnosis not present

## 2019-11-25 DIAGNOSIS — M810 Age-related osteoporosis without current pathological fracture: Secondary | ICD-10-CM | POA: Diagnosis not present

## 2019-11-25 DIAGNOSIS — F419 Anxiety disorder, unspecified: Secondary | ICD-10-CM | POA: Diagnosis not present

## 2019-11-25 DIAGNOSIS — E079 Disorder of thyroid, unspecified: Secondary | ICD-10-CM | POA: Diagnosis not present

## 2019-11-28 DIAGNOSIS — R7303 Prediabetes: Secondary | ICD-10-CM | POA: Diagnosis not present

## 2019-11-28 DIAGNOSIS — J441 Chronic obstructive pulmonary disease with (acute) exacerbation: Secondary | ICD-10-CM | POA: Diagnosis not present

## 2019-11-28 DIAGNOSIS — N1831 Chronic kidney disease, stage 3a: Secondary | ICD-10-CM | POA: Diagnosis not present

## 2019-11-28 DIAGNOSIS — R519 Headache, unspecified: Secondary | ICD-10-CM | POA: Diagnosis not present

## 2019-11-28 DIAGNOSIS — I129 Hypertensive chronic kidney disease with stage 1 through stage 4 chronic kidney disease, or unspecified chronic kidney disease: Secondary | ICD-10-CM | POA: Diagnosis not present

## 2019-11-28 DIAGNOSIS — J9601 Acute respiratory failure with hypoxia: Secondary | ICD-10-CM | POA: Diagnosis not present

## 2019-11-29 DIAGNOSIS — M810 Age-related osteoporosis without current pathological fracture: Secondary | ICD-10-CM | POA: Diagnosis not present

## 2019-11-29 DIAGNOSIS — F419 Anxiety disorder, unspecified: Secondary | ICD-10-CM | POA: Diagnosis not present

## 2019-11-29 DIAGNOSIS — I10 Essential (primary) hypertension: Secondary | ICD-10-CM | POA: Diagnosis not present

## 2019-11-29 DIAGNOSIS — E079 Disorder of thyroid, unspecified: Secondary | ICD-10-CM | POA: Diagnosis not present

## 2019-11-29 DIAGNOSIS — K219 Gastro-esophageal reflux disease without esophagitis: Secondary | ICD-10-CM | POA: Diagnosis not present

## 2019-11-29 DIAGNOSIS — R32 Unspecified urinary incontinence: Secondary | ICD-10-CM | POA: Diagnosis not present

## 2019-11-29 DIAGNOSIS — S81812D Laceration without foreign body, left lower leg, subsequent encounter: Secondary | ICD-10-CM | POA: Diagnosis not present

## 2019-11-29 DIAGNOSIS — Z9181 History of falling: Secondary | ICD-10-CM | POA: Diagnosis not present

## 2019-11-30 DIAGNOSIS — R32 Unspecified urinary incontinence: Secondary | ICD-10-CM | POA: Diagnosis not present

## 2019-11-30 DIAGNOSIS — Z9181 History of falling: Secondary | ICD-10-CM | POA: Diagnosis not present

## 2019-11-30 DIAGNOSIS — E079 Disorder of thyroid, unspecified: Secondary | ICD-10-CM | POA: Diagnosis not present

## 2019-11-30 DIAGNOSIS — K219 Gastro-esophageal reflux disease without esophagitis: Secondary | ICD-10-CM | POA: Diagnosis not present

## 2019-11-30 DIAGNOSIS — M810 Age-related osteoporosis without current pathological fracture: Secondary | ICD-10-CM | POA: Diagnosis not present

## 2019-11-30 DIAGNOSIS — F419 Anxiety disorder, unspecified: Secondary | ICD-10-CM | POA: Diagnosis not present

## 2019-11-30 DIAGNOSIS — S81812D Laceration without foreign body, left lower leg, subsequent encounter: Secondary | ICD-10-CM | POA: Diagnosis not present

## 2019-11-30 DIAGNOSIS — I10 Essential (primary) hypertension: Secondary | ICD-10-CM | POA: Diagnosis not present

## 2019-12-03 DIAGNOSIS — K219 Gastro-esophageal reflux disease without esophagitis: Secondary | ICD-10-CM | POA: Diagnosis not present

## 2019-12-03 DIAGNOSIS — Z9181 History of falling: Secondary | ICD-10-CM | POA: Diagnosis not present

## 2019-12-03 DIAGNOSIS — M810 Age-related osteoporosis without current pathological fracture: Secondary | ICD-10-CM | POA: Diagnosis not present

## 2019-12-03 DIAGNOSIS — E079 Disorder of thyroid, unspecified: Secondary | ICD-10-CM | POA: Diagnosis not present

## 2019-12-03 DIAGNOSIS — I10 Essential (primary) hypertension: Secondary | ICD-10-CM | POA: Diagnosis not present

## 2019-12-03 DIAGNOSIS — R32 Unspecified urinary incontinence: Secondary | ICD-10-CM | POA: Diagnosis not present

## 2019-12-03 DIAGNOSIS — S81812D Laceration without foreign body, left lower leg, subsequent encounter: Secondary | ICD-10-CM | POA: Diagnosis not present

## 2019-12-03 DIAGNOSIS — F419 Anxiety disorder, unspecified: Secondary | ICD-10-CM | POA: Diagnosis not present

## 2019-12-04 ENCOUNTER — Ambulatory Visit (HOSPITAL_COMMUNITY)
Admission: RE | Admit: 2019-12-04 | Discharge: 2019-12-04 | Disposition: A | Discharge status: Home / Self Care | Payer: Medicare HMO | Source: Ambulatory Visit | Attending: Internal Medicine | Admitting: Internal Medicine

## 2019-12-04 ENCOUNTER — Other Ambulatory Visit: Payer: Self-pay

## 2019-12-04 ENCOUNTER — Other Ambulatory Visit (HOSPITAL_COMMUNITY): Payer: Self-pay | Admitting: Internal Medicine

## 2019-12-04 DIAGNOSIS — R0602 Shortness of breath: Secondary | ICD-10-CM | POA: Diagnosis not present

## 2019-12-04 DIAGNOSIS — R0981 Nasal congestion: Secondary | ICD-10-CM | POA: Diagnosis not present

## 2019-12-04 DIAGNOSIS — I499 Cardiac arrhythmia, unspecified: Secondary | ICD-10-CM | POA: Diagnosis not present

## 2019-12-04 DIAGNOSIS — I517 Cardiomegaly: Secondary | ICD-10-CM | POA: Diagnosis not present

## 2019-12-04 DIAGNOSIS — R062 Wheezing: Secondary | ICD-10-CM | POA: Diagnosis not present

## 2019-12-07 DIAGNOSIS — R32 Unspecified urinary incontinence: Secondary | ICD-10-CM | POA: Diagnosis not present

## 2019-12-07 DIAGNOSIS — S81812D Laceration without foreign body, left lower leg, subsequent encounter: Secondary | ICD-10-CM | POA: Diagnosis not present

## 2019-12-07 DIAGNOSIS — F419 Anxiety disorder, unspecified: Secondary | ICD-10-CM | POA: Diagnosis not present

## 2019-12-07 DIAGNOSIS — I10 Essential (primary) hypertension: Secondary | ICD-10-CM | POA: Diagnosis not present

## 2019-12-07 DIAGNOSIS — M810 Age-related osteoporosis without current pathological fracture: Secondary | ICD-10-CM | POA: Diagnosis not present

## 2019-12-07 DIAGNOSIS — E079 Disorder of thyroid, unspecified: Secondary | ICD-10-CM | POA: Diagnosis not present

## 2019-12-07 DIAGNOSIS — Z9181 History of falling: Secondary | ICD-10-CM | POA: Diagnosis not present

## 2019-12-07 DIAGNOSIS — K219 Gastro-esophageal reflux disease without esophagitis: Secondary | ICD-10-CM | POA: Diagnosis not present

## 2019-12-10 DIAGNOSIS — S81812D Laceration without foreign body, left lower leg, subsequent encounter: Secondary | ICD-10-CM | POA: Diagnosis not present

## 2019-12-10 DIAGNOSIS — E079 Disorder of thyroid, unspecified: Secondary | ICD-10-CM | POA: Diagnosis not present

## 2019-12-10 DIAGNOSIS — Z9181 History of falling: Secondary | ICD-10-CM | POA: Diagnosis not present

## 2019-12-10 DIAGNOSIS — K219 Gastro-esophageal reflux disease without esophagitis: Secondary | ICD-10-CM | POA: Diagnosis not present

## 2019-12-10 DIAGNOSIS — R32 Unspecified urinary incontinence: Secondary | ICD-10-CM | POA: Diagnosis not present

## 2019-12-10 DIAGNOSIS — I10 Essential (primary) hypertension: Secondary | ICD-10-CM | POA: Diagnosis not present

## 2019-12-10 DIAGNOSIS — M810 Age-related osteoporosis without current pathological fracture: Secondary | ICD-10-CM | POA: Diagnosis not present

## 2019-12-10 DIAGNOSIS — F419 Anxiety disorder, unspecified: Secondary | ICD-10-CM | POA: Diagnosis not present

## 2019-12-14 DIAGNOSIS — Z9181 History of falling: Secondary | ICD-10-CM | POA: Diagnosis not present

## 2019-12-14 DIAGNOSIS — F419 Anxiety disorder, unspecified: Secondary | ICD-10-CM | POA: Diagnosis not present

## 2019-12-14 DIAGNOSIS — I10 Essential (primary) hypertension: Secondary | ICD-10-CM | POA: Diagnosis not present

## 2019-12-14 DIAGNOSIS — K219 Gastro-esophageal reflux disease without esophagitis: Secondary | ICD-10-CM | POA: Diagnosis not present

## 2019-12-14 DIAGNOSIS — R32 Unspecified urinary incontinence: Secondary | ICD-10-CM | POA: Diagnosis not present

## 2019-12-14 DIAGNOSIS — S81812D Laceration without foreign body, left lower leg, subsequent encounter: Secondary | ICD-10-CM | POA: Diagnosis not present

## 2019-12-14 DIAGNOSIS — E079 Disorder of thyroid, unspecified: Secondary | ICD-10-CM | POA: Diagnosis not present

## 2019-12-14 DIAGNOSIS — M810 Age-related osteoporosis without current pathological fracture: Secondary | ICD-10-CM | POA: Diagnosis not present

## 2019-12-29 DIAGNOSIS — S81812D Laceration without foreign body, left lower leg, subsequent encounter: Secondary | ICD-10-CM | POA: Diagnosis not present

## 2019-12-29 DIAGNOSIS — I10 Essential (primary) hypertension: Secondary | ICD-10-CM | POA: Diagnosis not present

## 2019-12-29 DIAGNOSIS — E079 Disorder of thyroid, unspecified: Secondary | ICD-10-CM | POA: Diagnosis not present

## 2019-12-29 DIAGNOSIS — M810 Age-related osteoporosis without current pathological fracture: Secondary | ICD-10-CM | POA: Diagnosis not present

## 2019-12-29 DIAGNOSIS — F419 Anxiety disorder, unspecified: Secondary | ICD-10-CM | POA: Diagnosis not present

## 2019-12-29 DIAGNOSIS — K219 Gastro-esophageal reflux disease without esophagitis: Secondary | ICD-10-CM | POA: Diagnosis not present

## 2019-12-29 DIAGNOSIS — Z9181 History of falling: Secondary | ICD-10-CM | POA: Diagnosis not present

## 2019-12-29 DIAGNOSIS — R32 Unspecified urinary incontinence: Secondary | ICD-10-CM | POA: Diagnosis not present

## 2020-01-11 DIAGNOSIS — L989 Disorder of the skin and subcutaneous tissue, unspecified: Secondary | ICD-10-CM | POA: Diagnosis not present

## 2020-01-11 DIAGNOSIS — Z0189 Encounter for other specified special examinations: Secondary | ICD-10-CM | POA: Diagnosis not present

## 2020-01-11 DIAGNOSIS — Z85038 Personal history of other malignant neoplasm of large intestine: Secondary | ICD-10-CM | POA: Diagnosis not present

## 2020-01-11 DIAGNOSIS — E039 Hypothyroidism, unspecified: Secondary | ICD-10-CM | POA: Diagnosis not present

## 2020-01-11 DIAGNOSIS — N3281 Overactive bladder: Secondary | ICD-10-CM | POA: Diagnosis not present

## 2020-01-11 DIAGNOSIS — Z86718 Personal history of other venous thrombosis and embolism: Secondary | ICD-10-CM | POA: Diagnosis not present

## 2020-01-11 DIAGNOSIS — K219 Gastro-esophageal reflux disease without esophagitis: Secondary | ICD-10-CM | POA: Diagnosis not present

## 2020-01-11 DIAGNOSIS — F0281 Dementia in other diseases classified elsewhere with behavioral disturbance: Secondary | ICD-10-CM | POA: Diagnosis not present

## 2020-01-11 DIAGNOSIS — C189 Malignant neoplasm of colon, unspecified: Secondary | ICD-10-CM | POA: Diagnosis not present

## 2020-01-11 DIAGNOSIS — N390 Urinary tract infection, site not specified: Secondary | ICD-10-CM | POA: Diagnosis not present

## 2020-01-11 DIAGNOSIS — I10 Essential (primary) hypertension: Secondary | ICD-10-CM | POA: Diagnosis not present

## 2020-01-11 DIAGNOSIS — M810 Age-related osteoporosis without current pathological fracture: Secondary | ICD-10-CM | POA: Diagnosis not present

## 2020-01-11 DIAGNOSIS — Z0001 Encounter for general adult medical examination with abnormal findings: Secondary | ICD-10-CM | POA: Diagnosis not present

## 2020-01-11 DIAGNOSIS — K5900 Constipation, unspecified: Secondary | ICD-10-CM | POA: Diagnosis not present

## 2020-01-18 IMAGING — CT CT ABD-PELV W/ CM
2 of 5 series · 16 of 46 positions shown, 18 images · IV contrast (omnipaque)
Comparison: 12/22/2015

CLINICAL DATA: Hematuria of unknown cause

EXAM:
CT ABDOMEN AND PELVIS WITH CONTRAST
TECHNIQUE: Multidetector CT imaging of the abdomen and pelvis was performed
using the standard protocol following bolus administration of
intravenous contrast.
CONTRAST:  100mL OMNIPAQUE IOHEXOL 300 MG/ML  SOLN

[Series 2: routine abd/pel with · axial · 0.77mm/px · z∈[-1249,-909]mm · 13 of 78 slices shown, 15 images]
[im 5/78  soft-tissue]
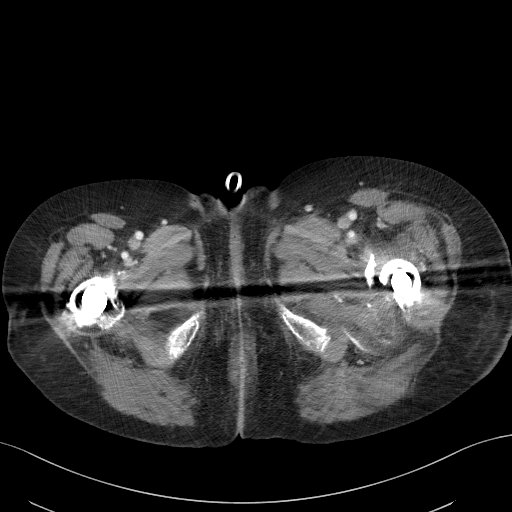
[im 5/78  bone]
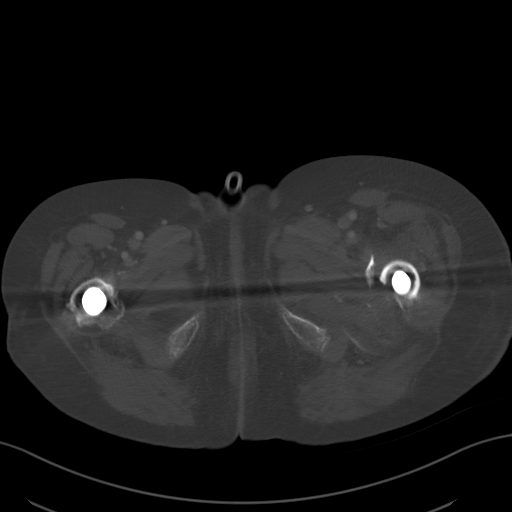
[im 9/78  soft-tissue]
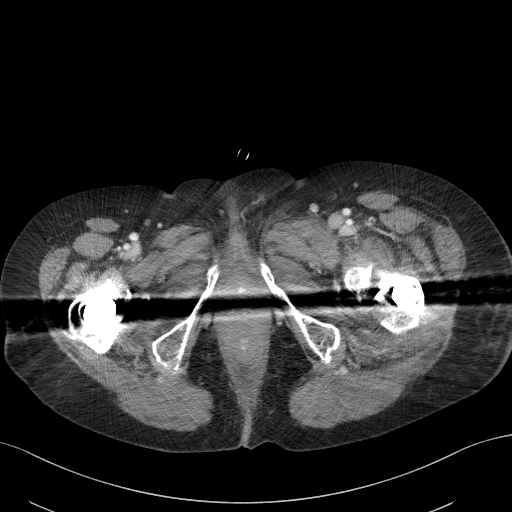
[im 21/78  soft-tissue]
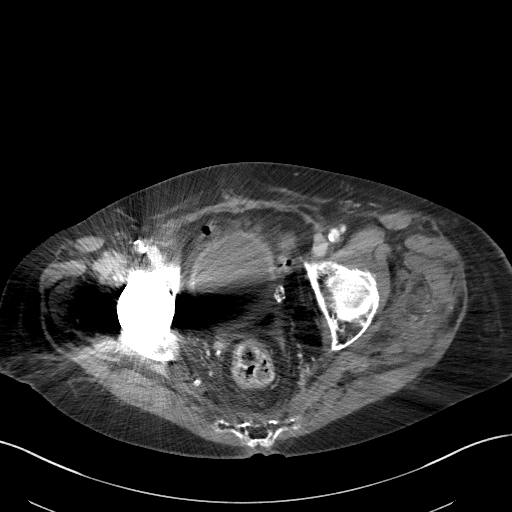
[im 25/78  soft-tissue]
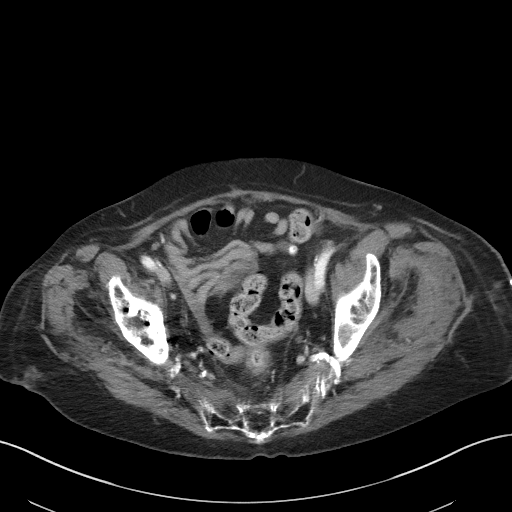
[im 29/78  soft-tissue]
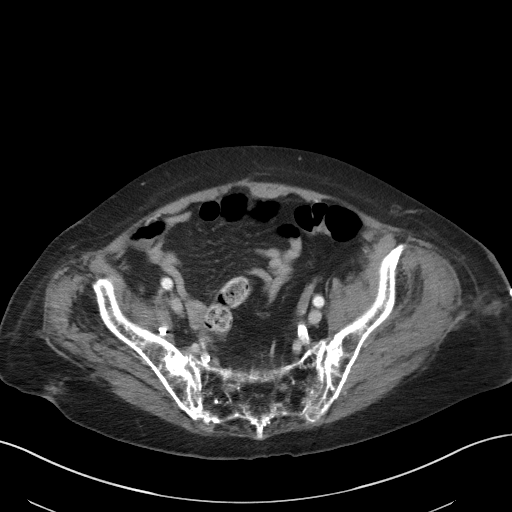
[im 37/78  soft-tissue]
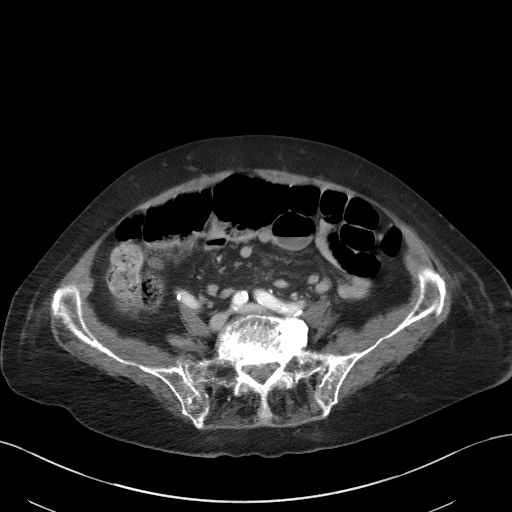
[im 41/78  soft-tissue]
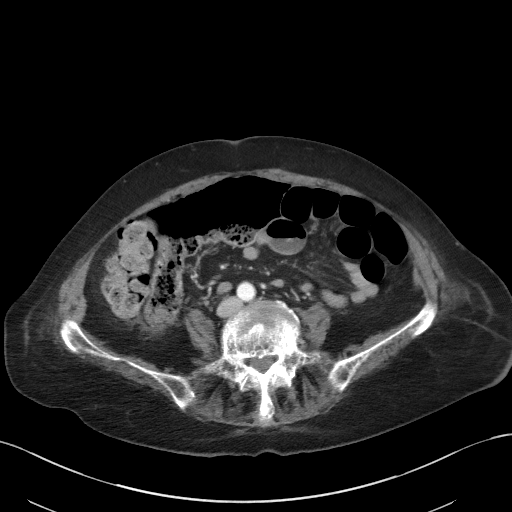
[im 45/78  soft-tissue]
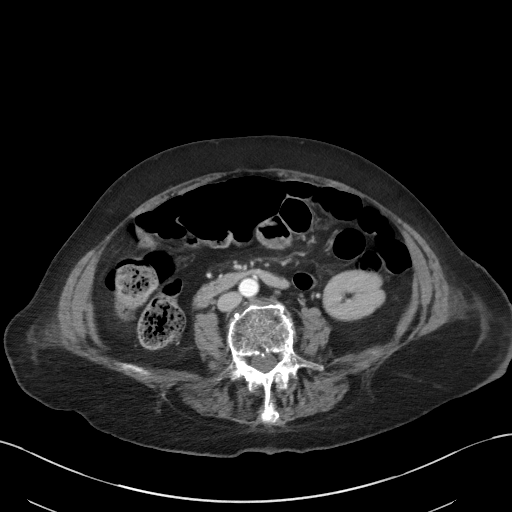
[im 53/78  soft-tissue]
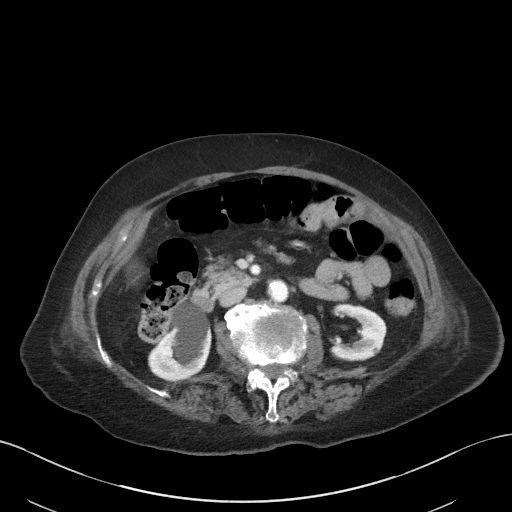
[im 53/78  bone]
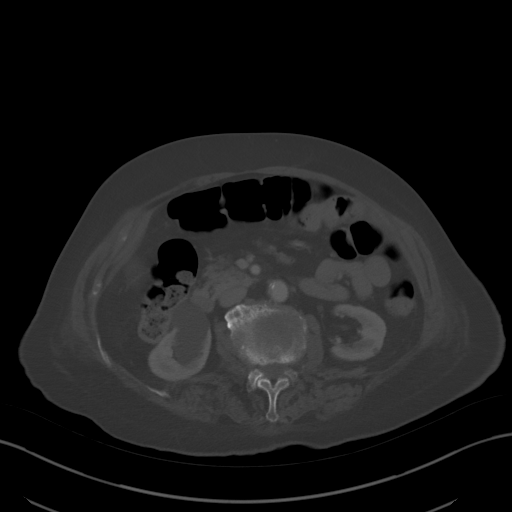
[im 57/78  soft-tissue]
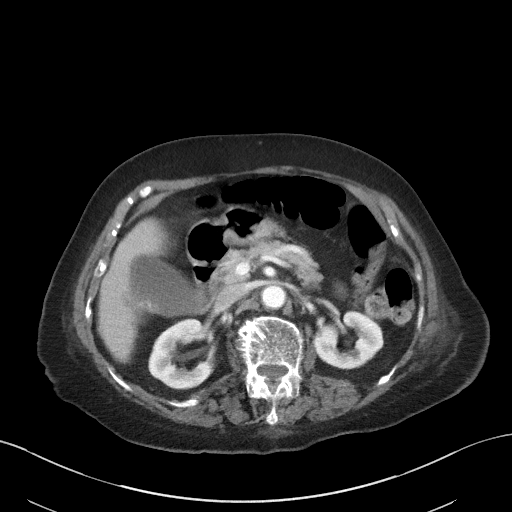
[im 61/78  soft-tissue]
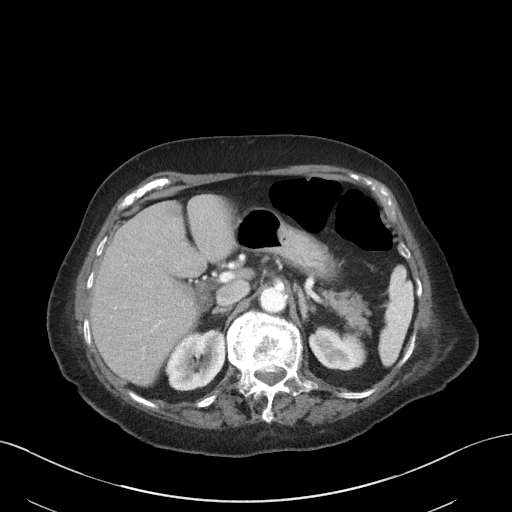
[im 69/78  soft-tissue]
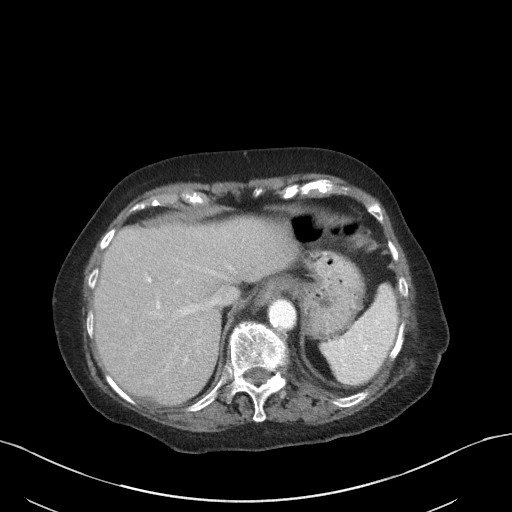
[im 73/78  soft-tissue]
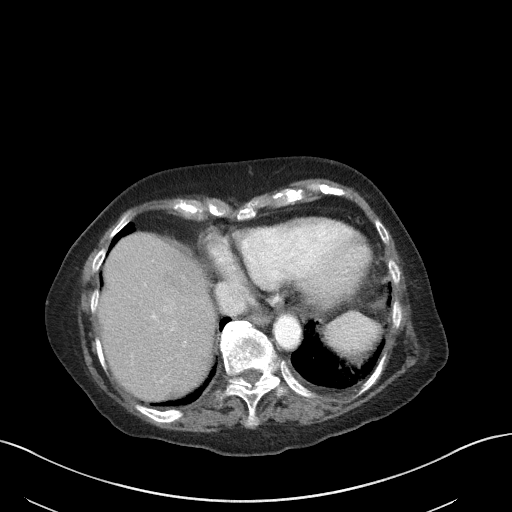

[Series 5: coronal st · coronal · 0.71mm/px · 3 of 81 slices shown]
[im 27/81  soft-tissue]
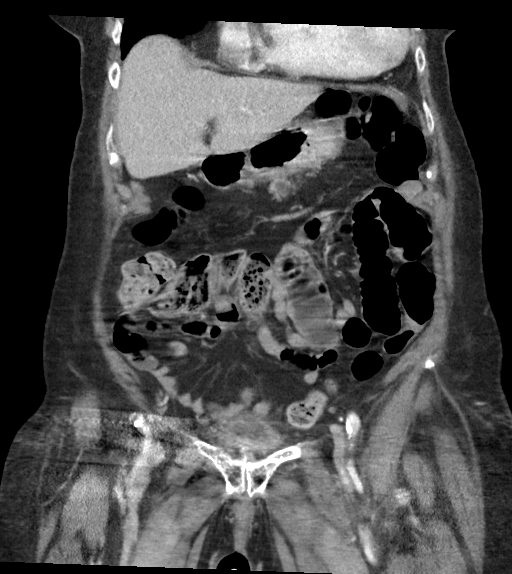
[im 36/81  soft-tissue]
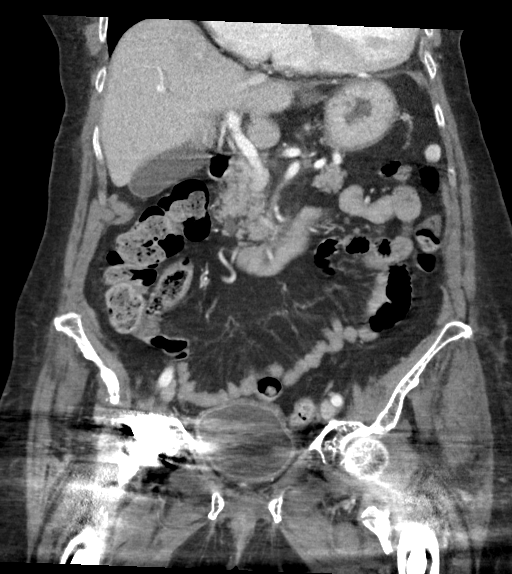
[im 45/81  soft-tissue]
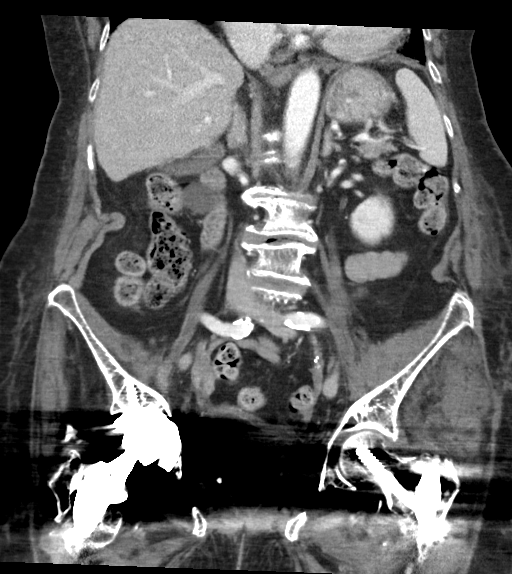

[16 of 46 positions shown; findings below may reference images not displayed]

FINDINGS: Lower chest:  Cardiomegaly.

Hepatobiliary: No focal liver abnormality.Small layering calculi
within the gallbladder. No obstructive or inflammatory changes

Pancreas: 2 cysts in the pancreatic body measuring up to 13 mm, new
but likely incidental for patient's age. Most often these are
attributed to intraductal papillary mucinous neoplasms. No ductal
dilatation.

Spleen: Unremarkable.

Adrenals/Urinary Tract: Negative adrenals. No hydronephrosis or
stone. Simple appearing bilateral renal cysts with lobulated cyst on
the right measuring up to 4.8 cm. The bladder is partially obscured
by artifact from hip prosthesis. There is still clear avid mucosal
enhancement, wall thickening, and perivesicular edema. This is
presumably infectious cystitis. Radiation cystitis is considered
given history of colon cancer and the degree of osteopenia in the
sacrum raising the possibility of prior radiotherapy, but the
bladder had a normal appearance on prior CT and colon cancer
treatment was reportedly in 7223.

Stomach/Bowel: Negative for obstruction or visible bowel
inflammation.

Vascular/Lymphatic: Atherosclerosis with narrowing at the origins of
the celiac and SMA .

Reproductive:Hysterectomy

Other: No ascites or pneumoperitoneum.

Musculoskeletal: Swelling related to recent intertrochanteric left
femur fracture. Right hip arthroplasty. Marked osteopenia with
compression fractures at L1 through L5
IMPRESSION: 1. Cystitis, please correlate with urinalysis. Suspect prior pelvic
radiotherapy in this patient with history of colon cancer.
2. Small pancreatic cysts measuring up to 13 mm, not seen in 4869
but likely incidental given patient's age.
3. Cholelithiasis

## 2020-01-19 DIAGNOSIS — M810 Age-related osteoporosis without current pathological fracture: Secondary | ICD-10-CM | POA: Diagnosis not present

## 2020-01-19 DIAGNOSIS — F419 Anxiety disorder, unspecified: Secondary | ICD-10-CM | POA: Diagnosis not present

## 2020-01-19 DIAGNOSIS — S81812D Laceration without foreign body, left lower leg, subsequent encounter: Secondary | ICD-10-CM | POA: Diagnosis not present

## 2020-01-19 DIAGNOSIS — R32 Unspecified urinary incontinence: Secondary | ICD-10-CM | POA: Diagnosis not present

## 2020-01-19 DIAGNOSIS — K219 Gastro-esophageal reflux disease without esophagitis: Secondary | ICD-10-CM | POA: Diagnosis not present

## 2020-01-19 DIAGNOSIS — Z9181 History of falling: Secondary | ICD-10-CM | POA: Diagnosis not present

## 2020-01-19 DIAGNOSIS — I10 Essential (primary) hypertension: Secondary | ICD-10-CM | POA: Diagnosis not present

## 2020-01-19 DIAGNOSIS — E079 Disorder of thyroid, unspecified: Secondary | ICD-10-CM | POA: Diagnosis not present

## 2020-01-24 DIAGNOSIS — I10 Essential (primary) hypertension: Secondary | ICD-10-CM | POA: Diagnosis not present

## 2020-01-24 DIAGNOSIS — Z9181 History of falling: Secondary | ICD-10-CM | POA: Diagnosis not present

## 2020-01-24 DIAGNOSIS — K219 Gastro-esophageal reflux disease without esophagitis: Secondary | ICD-10-CM | POA: Diagnosis not present

## 2020-01-24 DIAGNOSIS — E079 Disorder of thyroid, unspecified: Secondary | ICD-10-CM | POA: Diagnosis not present

## 2020-01-24 DIAGNOSIS — S81812D Laceration without foreign body, left lower leg, subsequent encounter: Secondary | ICD-10-CM | POA: Diagnosis not present

## 2020-01-24 DIAGNOSIS — F419 Anxiety disorder, unspecified: Secondary | ICD-10-CM | POA: Diagnosis not present

## 2020-01-24 DIAGNOSIS — R32 Unspecified urinary incontinence: Secondary | ICD-10-CM | POA: Diagnosis not present

## 2020-01-24 DIAGNOSIS — M810 Age-related osteoporosis without current pathological fracture: Secondary | ICD-10-CM | POA: Diagnosis not present

## 2020-02-15 DIAGNOSIS — N1831 Chronic kidney disease, stage 3a: Secondary | ICD-10-CM | POA: Diagnosis not present

## 2020-02-15 DIAGNOSIS — E079 Disorder of thyroid, unspecified: Secondary | ICD-10-CM | POA: Diagnosis not present

## 2020-02-15 DIAGNOSIS — E039 Hypothyroidism, unspecified: Secondary | ICD-10-CM | POA: Diagnosis not present

## 2020-02-15 DIAGNOSIS — I129 Hypertensive chronic kidney disease with stage 1 through stage 4 chronic kidney disease, or unspecified chronic kidney disease: Secondary | ICD-10-CM | POA: Diagnosis not present

## 2020-03-14 DIAGNOSIS — R059 Cough, unspecified: Secondary | ICD-10-CM | POA: Diagnosis not present

## 2020-05-05 DIAGNOSIS — J441 Chronic obstructive pulmonary disease with (acute) exacerbation: Secondary | ICD-10-CM | POA: Diagnosis not present

## 2020-05-05 DIAGNOSIS — R2243 Localized swelling, mass and lump, lower limb, bilateral: Secondary | ICD-10-CM | POA: Diagnosis not present

## 2020-07-05 DIAGNOSIS — C44329 Squamous cell carcinoma of skin of other parts of face: Secondary | ICD-10-CM | POA: Diagnosis not present

## 2020-07-05 DIAGNOSIS — D0439 Carcinoma in situ of skin of other parts of face: Secondary | ICD-10-CM | POA: Diagnosis not present

## 2020-07-13 DIAGNOSIS — R0602 Shortness of breath: Secondary | ICD-10-CM | POA: Diagnosis not present

## 2020-07-13 DIAGNOSIS — K219 Gastro-esophageal reflux disease without esophagitis: Secondary | ICD-10-CM | POA: Diagnosis not present

## 2020-07-13 DIAGNOSIS — I129 Hypertensive chronic kidney disease with stage 1 through stage 4 chronic kidney disease, or unspecified chronic kidney disease: Secondary | ICD-10-CM | POA: Diagnosis not present

## 2020-07-13 DIAGNOSIS — R2243 Localized swelling, mass and lump, lower limb, bilateral: Secondary | ICD-10-CM | POA: Diagnosis not present

## 2020-07-13 DIAGNOSIS — Z0001 Encounter for general adult medical examination with abnormal findings: Secondary | ICD-10-CM | POA: Diagnosis not present

## 2020-07-13 DIAGNOSIS — G4700 Insomnia, unspecified: Secondary | ICD-10-CM | POA: Diagnosis not present

## 2020-07-13 DIAGNOSIS — N3281 Overactive bladder: Secondary | ICD-10-CM | POA: Diagnosis not present

## 2020-07-13 DIAGNOSIS — Z0189 Encounter for other specified special examinations: Secondary | ICD-10-CM | POA: Diagnosis not present

## 2020-07-13 DIAGNOSIS — N390 Urinary tract infection, site not specified: Secondary | ICD-10-CM | POA: Diagnosis not present

## 2020-07-13 DIAGNOSIS — E039 Hypothyroidism, unspecified: Secondary | ICD-10-CM | POA: Diagnosis not present

## 2020-07-13 DIAGNOSIS — Z85038 Personal history of other malignant neoplasm of large intestine: Secondary | ICD-10-CM | POA: Diagnosis not present

## 2020-07-13 DIAGNOSIS — L989 Disorder of the skin and subcutaneous tissue, unspecified: Secondary | ICD-10-CM | POA: Diagnosis not present

## 2020-07-13 DIAGNOSIS — R7303 Prediabetes: Secondary | ICD-10-CM | POA: Diagnosis not present

## 2020-07-13 DIAGNOSIS — M810 Age-related osteoporosis without current pathological fracture: Secondary | ICD-10-CM | POA: Diagnosis not present

## 2020-07-13 DIAGNOSIS — C189 Malignant neoplasm of colon, unspecified: Secondary | ICD-10-CM | POA: Diagnosis not present

## 2020-08-16 DIAGNOSIS — Z08 Encounter for follow-up examination after completed treatment for malignant neoplasm: Secondary | ICD-10-CM | POA: Diagnosis not present

## 2020-08-16 DIAGNOSIS — Z85828 Personal history of other malignant neoplasm of skin: Secondary | ICD-10-CM | POA: Diagnosis not present

## 2020-10-12 DIAGNOSIS — R0600 Dyspnea, unspecified: Secondary | ICD-10-CM | POA: Diagnosis not present

## 2020-10-12 DIAGNOSIS — I10 Essential (primary) hypertension: Secondary | ICD-10-CM | POA: Diagnosis not present

## 2020-10-12 DIAGNOSIS — Z86718 Personal history of other venous thrombosis and embolism: Secondary | ICD-10-CM | POA: Diagnosis not present

## 2020-10-12 DIAGNOSIS — R2243 Localized swelling, mass and lump, lower limb, bilateral: Secondary | ICD-10-CM | POA: Diagnosis not present

## 2020-10-12 DIAGNOSIS — E039 Hypothyroidism, unspecified: Secondary | ICD-10-CM | POA: Diagnosis not present

## 2020-10-12 DIAGNOSIS — M810 Age-related osteoporosis without current pathological fracture: Secondary | ICD-10-CM | POA: Diagnosis not present

## 2020-10-12 DIAGNOSIS — K219 Gastro-esophageal reflux disease without esophagitis: Secondary | ICD-10-CM | POA: Diagnosis not present

## 2020-10-12 DIAGNOSIS — Z0001 Encounter for general adult medical examination with abnormal findings: Secondary | ICD-10-CM | POA: Diagnosis not present

## 2020-10-12 DIAGNOSIS — Z85038 Personal history of other malignant neoplasm of large intestine: Secondary | ICD-10-CM | POA: Diagnosis not present

## 2020-10-15 DIAGNOSIS — N3281 Overactive bladder: Secondary | ICD-10-CM | POA: Diagnosis present

## 2020-10-15 DIAGNOSIS — K219 Gastro-esophageal reflux disease without esophagitis: Secondary | ICD-10-CM | POA: Diagnosis present

## 2020-10-15 DIAGNOSIS — E039 Hypothyroidism, unspecified: Secondary | ICD-10-CM | POA: Diagnosis present

## 2020-10-15 DIAGNOSIS — Z86718 Personal history of other venous thrombosis and embolism: Secondary | ICD-10-CM

## 2020-10-15 DIAGNOSIS — I10 Essential (primary) hypertension: Secondary | ICD-10-CM | POA: Diagnosis present

## 2020-12-13 ENCOUNTER — Other Ambulatory Visit: Payer: Self-pay

## 2020-12-13 ENCOUNTER — Inpatient Hospital Stay (HOSPITAL_COMMUNITY)
Admission: EM | Admit: 2020-12-13 | Discharge: 2020-12-15 | DRG: 291 | Disposition: A | Discharge status: Home Health | Payer: Medicare HMO | Attending: Family Medicine | Admitting: Family Medicine

## 2020-12-13 ENCOUNTER — Emergency Department (HOSPITAL_COMMUNITY): Payer: Medicare HMO

## 2020-12-13 ENCOUNTER — Encounter (HOSPITAL_COMMUNITY): Payer: Self-pay | Admitting: Radiology

## 2020-12-13 DIAGNOSIS — N184 Chronic kidney disease, stage 4 (severe): Secondary | ICD-10-CM | POA: Diagnosis not present

## 2020-12-13 DIAGNOSIS — R778 Other specified abnormalities of plasma proteins: Secondary | ICD-10-CM | POA: Diagnosis not present

## 2020-12-13 DIAGNOSIS — Z85038 Personal history of other malignant neoplasm of large intestine: Secondary | ICD-10-CM | POA: Diagnosis not present

## 2020-12-13 DIAGNOSIS — E890 Postprocedural hypothyroidism: Secondary | ICD-10-CM | POA: Diagnosis present

## 2020-12-13 DIAGNOSIS — E8770 Fluid overload, unspecified: Secondary | ICD-10-CM | POA: Diagnosis present

## 2020-12-13 DIAGNOSIS — Z90710 Acquired absence of both cervix and uterus: Secondary | ICD-10-CM

## 2020-12-13 DIAGNOSIS — G5600 Carpal tunnel syndrome, unspecified upper limb: Secondary | ICD-10-CM | POA: Diagnosis present

## 2020-12-13 DIAGNOSIS — R188 Other ascites: Secondary | ICD-10-CM | POA: Diagnosis not present

## 2020-12-13 DIAGNOSIS — J811 Chronic pulmonary edema: Secondary | ICD-10-CM

## 2020-12-13 DIAGNOSIS — N3281 Overactive bladder: Secondary | ICD-10-CM

## 2020-12-13 DIAGNOSIS — Z7901 Long term (current) use of anticoagulants: Secondary | ICD-10-CM | POA: Diagnosis not present

## 2020-12-13 DIAGNOSIS — I509 Heart failure, unspecified: Principal | ICD-10-CM

## 2020-12-13 DIAGNOSIS — Z888 Allergy status to other drugs, medicaments and biological substances status: Secondary | ICD-10-CM | POA: Diagnosis not present

## 2020-12-13 DIAGNOSIS — Z86718 Personal history of other venous thrombosis and embolism: Secondary | ICD-10-CM

## 2020-12-13 DIAGNOSIS — M7989 Other specified soft tissue disorders: Secondary | ICD-10-CM | POA: Diagnosis not present

## 2020-12-13 DIAGNOSIS — Z20822 Contact with and (suspected) exposure to covid-19: Secondary | ICD-10-CM | POA: Diagnosis not present

## 2020-12-13 DIAGNOSIS — J90 Pleural effusion, not elsewhere classified: Secondary | ICD-10-CM | POA: Diagnosis not present

## 2020-12-13 DIAGNOSIS — Z79899 Other long term (current) drug therapy: Secondary | ICD-10-CM | POA: Diagnosis not present

## 2020-12-13 DIAGNOSIS — I5031 Acute diastolic (congestive) heart failure: Secondary | ICD-10-CM | POA: Diagnosis present

## 2020-12-13 DIAGNOSIS — Z91048 Other nonmedicinal substance allergy status: Secondary | ICD-10-CM

## 2020-12-13 DIAGNOSIS — K219 Gastro-esophageal reflux disease without esophagitis: Secondary | ICD-10-CM | POA: Diagnosis present

## 2020-12-13 DIAGNOSIS — E876 Hypokalemia: Secondary | ICD-10-CM | POA: Diagnosis not present

## 2020-12-13 DIAGNOSIS — I130 Hypertensive heart and chronic kidney disease with heart failure and stage 1 through stage 4 chronic kidney disease, or unspecified chronic kidney disease: Secondary | ICD-10-CM | POA: Diagnosis not present

## 2020-12-13 DIAGNOSIS — I10 Essential (primary) hypertension: Secondary | ICD-10-CM | POA: Diagnosis not present

## 2020-12-13 DIAGNOSIS — I4891 Unspecified atrial fibrillation: Secondary | ICD-10-CM

## 2020-12-13 DIAGNOSIS — Z8673 Personal history of transient ischemic attack (TIA), and cerebral infarction without residual deficits: Secondary | ICD-10-CM | POA: Diagnosis not present

## 2020-12-13 DIAGNOSIS — I517 Cardiomegaly: Secondary | ICD-10-CM | POA: Diagnosis not present

## 2020-12-13 DIAGNOSIS — N179 Acute kidney failure, unspecified: Secondary | ICD-10-CM | POA: Diagnosis not present

## 2020-12-13 DIAGNOSIS — Z79890 Hormone replacement therapy (postmenopausal): Secondary | ICD-10-CM

## 2020-12-13 DIAGNOSIS — E039 Hypothyroidism, unspecified: Secondary | ICD-10-CM

## 2020-12-13 DIAGNOSIS — R0602 Shortness of breath: Secondary | ICD-10-CM | POA: Diagnosis not present

## 2020-12-13 DIAGNOSIS — N3946 Mixed incontinence: Secondary | ICD-10-CM | POA: Diagnosis present

## 2020-12-13 DIAGNOSIS — M810 Age-related osteoporosis without current pathological fracture: Secondary | ICD-10-CM | POA: Diagnosis present

## 2020-12-13 DIAGNOSIS — S72009A Fracture of unspecified part of neck of unspecified femur, initial encounter for closed fracture: Secondary | ICD-10-CM | POA: Diagnosis present

## 2020-12-13 DIAGNOSIS — H9190 Unspecified hearing loss, unspecified ear: Secondary | ICD-10-CM | POA: Diagnosis present

## 2020-12-13 DIAGNOSIS — N1831 Chronic kidney disease, stage 3a: Secondary | ICD-10-CM | POA: Diagnosis present

## 2020-12-13 DIAGNOSIS — R140 Abdominal distension (gaseous): Secondary | ICD-10-CM

## 2020-12-13 DIAGNOSIS — Z66 Do not resuscitate: Secondary | ICD-10-CM | POA: Diagnosis not present

## 2020-12-13 DIAGNOSIS — R0600 Dyspnea, unspecified: Secondary | ICD-10-CM | POA: Diagnosis not present

## 2020-12-13 DIAGNOSIS — I639 Cerebral infarction, unspecified: Secondary | ICD-10-CM | POA: Diagnosis present

## 2020-12-13 LAB — CBC WITH DIFFERENTIAL/PLATELET
Abs Immature Granulocytes: 0.02 10*3/uL (ref 0.00–0.07)
Basophils Absolute: 0.1 10*3/uL (ref 0.0–0.1)
Basophils Relative: 1 %
Eosinophils Absolute: 0.2 10*3/uL (ref 0.0–0.5)
Eosinophils Relative: 2 %
HCT: 37.7 % (ref 36.0–46.0)
Hemoglobin: 12.3 g/dL (ref 12.0–15.0)
Immature Granulocytes: 0 %
Lymphocytes Relative: 21 %
Lymphs Abs: 1.9 10*3/uL (ref 0.7–4.0)
MCH: 32.7 pg (ref 26.0–34.0)
MCHC: 32.6 g/dL (ref 30.0–36.0)
MCV: 100.3 fL — ABNORMAL HIGH (ref 80.0–100.0)
Monocytes Absolute: 0.7 10*3/uL (ref 0.1–1.0)
Monocytes Relative: 8 %
Neutro Abs: 6.1 10*3/uL (ref 1.7–7.7)
Neutrophils Relative %: 68 %
Platelets: 279 10*3/uL (ref 150–400)
RBC: 3.76 MIL/uL — ABNORMAL LOW (ref 3.87–5.11)
RDW: 13.6 % (ref 11.5–15.5)
WBC: 9 10*3/uL (ref 4.0–10.5)
nRBC: 0 % (ref 0.0–0.2)

## 2020-12-13 LAB — URINALYSIS, COMPLETE (UACMP) WITH MICROSCOPIC
Bacteria, UA: NONE SEEN
Bilirubin Urine: NEGATIVE
Glucose, UA: NEGATIVE mg/dL
Hgb urine dipstick: NEGATIVE
Ketones, ur: NEGATIVE mg/dL
Leukocytes,Ua: NEGATIVE
Nitrite: NEGATIVE
Protein, ur: NEGATIVE mg/dL
Specific Gravity, Urine: 1.005 (ref 1.005–1.030)
pH: 7 (ref 5.0–8.0)

## 2020-12-13 LAB — BASIC METABOLIC PANEL
Anion gap: 9 (ref 5–15)
BUN: 13 mg/dL (ref 8–23)
CO2: 27 mmol/L (ref 22–32)
Calcium: 9.1 mg/dL (ref 8.9–10.3)
Chloride: 102 mmol/L (ref 98–111)
Creatinine, Ser: 1.11 mg/dL — ABNORMAL HIGH (ref 0.44–1.00)
GFR, Estimated: 45 mL/min — ABNORMAL LOW (ref >60)
Glucose, Bld: 134 mg/dL — ABNORMAL HIGH (ref 70–99)
Potassium: 3.6 mmol/L (ref 3.5–5.1)
Sodium: 138 mmol/L (ref 135–145)

## 2020-12-13 LAB — IRON AND TIBC
Iron: 20 ug/dL — ABNORMAL LOW (ref 28–170)
Saturation Ratios: 6 % — ABNORMAL LOW (ref 10.4–31.8)
TIBC: 329 ug/dL (ref 250–450)
UIBC: 309 ug/dL

## 2020-12-13 LAB — VITAMIN B12: Vitamin B-12: 859 pg/mL (ref 180–914)

## 2020-12-13 LAB — FERRITIN: Ferritin: 34 ng/mL (ref 11–307)

## 2020-12-13 LAB — TROPONIN I (HIGH SENSITIVITY)
Troponin I (High Sensitivity): 76 ng/L — ABNORMAL HIGH (ref <18)
Troponin I (High Sensitivity): 76 ng/L — ABNORMAL HIGH (ref <18)
Troponin I (High Sensitivity): 81 ng/L — ABNORMAL HIGH (ref <18)

## 2020-12-13 LAB — RAPID HIV SCREEN (HIV 1/2 AB+AG)
HIV 1/2 Antibodies: NONREACTIVE
HIV-1 P24 Antigen - HIV24: NONREACTIVE

## 2020-12-13 LAB — RESP PANEL BY RT-PCR (FLU A&B, COVID) ARPGX2
Influenza A by PCR: NEGATIVE
Influenza B by PCR: NEGATIVE
SARS Coronavirus 2 by RT PCR: NEGATIVE

## 2020-12-13 LAB — BRAIN NATRIURETIC PEPTIDE: B Natriuretic Peptide: 349 pg/mL — ABNORMAL HIGH (ref 0.0–100.0)

## 2020-12-13 LAB — TSH: TSH: 0.87 u[IU]/mL (ref 0.350–4.500)

## 2020-12-13 MED ORDER — POTASSIUM CHLORIDE 20 MEQ PO PACK
20.0000 meq | PACK | Freq: Two times a day (BID) | ORAL | Status: DC
  Administered 2020-12-14 – 2020-12-15 (×3): 20 meq via ORAL
  Filled 2020-12-13 (×3): qty 1

## 2020-12-13 MED ORDER — ENOXAPARIN SODIUM 30 MG/0.3ML IJ SOSY
30.0000 mg | PREFILLED_SYRINGE | INTRAMUSCULAR | Status: DC
  Administered 2020-12-13 – 2020-12-14 (×2): 30 mg via SUBCUTANEOUS
  Filled 2020-12-13 (×2): qty 0.3

## 2020-12-13 MED ORDER — TRAZODONE HCL 50 MG PO TABS
50.0000 mg | ORAL_TABLET | Freq: Every evening | ORAL | Status: DC | PRN
  Administered 2020-12-14: 50 mg via ORAL
  Filled 2020-12-13: qty 1

## 2020-12-13 MED ORDER — ACETAMINOPHEN 325 MG PO TABS
650.0000 mg | ORAL_TABLET | Freq: Four times a day (QID) | ORAL | Status: DC | PRN
  Administered 2020-12-14: 650 mg via ORAL
  Filled 2020-12-13: qty 2

## 2020-12-13 MED ORDER — FUROSEMIDE 10 MG/ML IJ SOLN
20.0000 mg | Freq: Two times a day (BID) | INTRAMUSCULAR | Status: DC
  Administered 2020-12-14 (×2): 20 mg via INTRAVENOUS
  Filled 2020-12-13 (×2): qty 2

## 2020-12-13 MED ORDER — ACETAMINOPHEN 650 MG RE SUPP: 650.0000 mg | Freq: Four times a day (QID) | RECTAL | Status: DC | PRN

## 2020-12-13 MED ORDER — AMLODIPINE BESYLATE 5 MG PO TABS
5.0000 mg | ORAL_TABLET | Freq: Every day | ORAL | Status: DC
  Administered 2020-12-14 – 2020-12-15 (×2): 5 mg via ORAL
  Filled 2020-12-13 (×2): qty 1

## 2020-12-13 MED ORDER — OXYCODONE HCL 5 MG PO TABS: 5.0000 mg | ORAL_TABLET | ORAL | Status: DC | PRN

## 2020-12-13 MED ORDER — ONDANSETRON HCL 4 MG PO TABS: 4.0000 mg | ORAL_TABLET | Freq: Four times a day (QID) | ORAL | Status: DC | PRN

## 2020-12-13 MED ORDER — HYDROCHLOROTHIAZIDE 25 MG PO TABS
25.0000 mg | ORAL_TABLET | Freq: Every day | ORAL | Status: DC
  Administered 2020-12-14: 25 mg via ORAL
  Filled 2020-12-13: qty 1

## 2020-12-13 MED ORDER — LEVOTHYROXINE SODIUM 50 MCG PO TABS
125.0000 ug | ORAL_TABLET | Freq: Every day | ORAL | Status: DC
  Administered 2020-12-14: 125 ug via ORAL

## 2020-12-13 MED ORDER — ONDANSETRON HCL 4 MG/2ML IJ SOLN: 4.0000 mg | Freq: Four times a day (QID) | INTRAMUSCULAR | Status: DC | PRN

## 2020-12-13 MED ORDER — FUROSEMIDE 10 MG/ML IJ SOLN
40.0000 mg | Freq: Once | INTRAMUSCULAR | Status: AC
  Administered 2020-12-13: 40 mg via INTRAVENOUS
  Filled 2020-12-13: qty 4

## 2020-12-13 MED ORDER — RALOXIFENE HCL 60 MG PO TABS
60.0000 mg | ORAL_TABLET | Freq: Every day | ORAL | Status: DC
  Administered 2020-12-14 – 2020-12-15 (×2): 60 mg via ORAL
  Filled 2020-12-13 (×2): qty 1

## 2020-12-13 MED ORDER — MIRABEGRON ER 25 MG PO TB24
25.0000 mg | ORAL_TABLET | Freq: Every day | ORAL | Status: DC
  Administered 2020-12-14 – 2020-12-15 (×2): 25 mg via ORAL
  Filled 2020-12-13 (×2): qty 1

## 2020-12-13 MED ORDER — POLYETHYLENE GLYCOL 3350 17 G PO PACK: 17.0000 g | PACK | Freq: Every day | ORAL | Status: DC | PRN

## 2020-12-13 MED ORDER — SODIUM CHLORIDE 0.9% FLUSH
3.0000 mL | Freq: Two times a day (BID) | INTRAVENOUS | Status: DC
  Administered 2020-12-13 – 2020-12-14 (×3): 3 mL via INTRAVENOUS

## 2020-12-13 NOTE — ED Triage Notes (Addendum)
For the past 5 days pt has had trouble with her legs swelling as well as difficulty breathing. Daughter states that she doesn't know that she has a history of heart failure but she does take lasixs when she has episodes like this. States the lasixs does is 20mg . Pt is able to speak in full sentences in triage. Negative covid test at home this morning.

## 2020-12-13 NOTE — ED Provider Notes (Signed)
[facility] EMERGENCY DEPARTMENT Provider Note  CSN: [id number] Arrival date & time: 12/13/20 1539    History Chief Complaint  Patient presents with   Shortness of Breath    Christine [name] is a 87 y.o. female brought to the ED by family for evaluation of cough and SOB ongoing for the last 5 days, cough is not productive of sputum but family has heard some wheezing. She has not had a fever. She has had some puffiness in her feet, but no significant LE edema or reported weight gain. She has Lasix she takes as needed but no formal diagnosis of CHF. Also on Xarelto for unknown reason ?Afib. Patient denies any CP.    Past Medical History:  Diagnosis Date   Cancer [facility])    colon   Carpal tunnel syndrome    Complication of anesthesia    hard time waking up after thyroidectomy   DJD (degenerative joint disease)    Family history of adverse reaction to anesthesia    daughter PONV   Hearing loss    Hypothyroidism    Osteoporosis    Wears glasses     Past Surgical History:  Procedure Laterality Date   ABDOMINAL HYSTERECTOMY     COLON RESECTION  1995   for colon cancer   EYE SURGERY Bilateral    cataract surgery   INTRAMEDULLARY (IM) NAIL INTERTROCHANTERIC Right 01/09/2015   Procedure: INTRAMEDULLARY (IM) NAIL INTERTROCHANTRIC;  Surgeon: Corky Mull, MD;  Location: [facility] ORS;  Service: Orthopedics;  Laterality: Right;   INTRAMEDULLARY (IM) NAIL INTERTROCHANTERIC Left 05/15/2018   Procedure: INTRAMEDULLARY (IM) NAIL INTERTROCHANTRIC - LEFT FEMUR;  Surgeon: Corky Mull, MD;  Location: [facility] ORS;  Service: Orthopedics;  Laterality: Left;   Lung surgery     as a child for empyema   OOPHORECTOMY     THYROID SURGERY  1967   for multinodular goitre   TOTAL HIP REVISION Right 10/24/2016   Procedure: TOTAL HIP REVISION AND HARDWARE REMOVAL;  Surgeon: Corky Mull, MD;  Location: [facility] ORS;  Service: Orthopedics;  Laterality: Right;    Family History  Problem Relation Age of Onset    Heart disease Father    Diabetes Sister    Cancer Sister    Diabetes Brother    Cancer Brother    Cancer Mother     Social History   Tobacco Use   Smoking status: Never   Smokeless tobacco: Never  Vaping Use   Vaping Use: Never used  Substance Use Topics   Alcohol use: No   Drug use: No     Home Medications Prior to Admission medications   Medication Sig Start Date End Date Taking? Authorizing Provider  acetaminophen (TYLENOL) 325 MG tablet Take 650 mg by mouth every 6 (six) hours as needed for pain. 01/13/15   [provider]  amLODipine (NORVASC) 5 MG tablet Take 1 tablet (5 mg total) by mouth daily. 05/26/18   Stark Jock Jude, MD  calcium carbonate (OSCAL) 1500 (600 Ca) MG TABS tablet Take 600 mg by mouth 2 (two) times daily with a meal.    [provider]  Cholecalciferol (KP VITAMIN D3) 2000 units CAPS Take 2,000 Units by mouth daily.     [provider]  docusate sodium (COLACE) 100 MG capsule Take 1 capsule (100 mg total) by mouth 2 (two) times daily. Patient taking differently: Take 100 mg by mouth daily.  05/18/18   Max Sane, MD  furosemide (LASIX) 20 MG tablet  Take 20 mg by mouth daily as needed.  03/24/19   [provider]  hydrochlorothiazide (HYDRODIURIL) 25 MG tablet Take 1 tablet (25 mg total) by mouth daily. 05/26/18   Stark Jock Jude, MD  levothyroxine (SYNTHROID) 125 MCG tablet Take 125 mcg by mouth daily. 12/21/18   [provider]  LUBRICANT EYE DROPS 0.4-0.3 % SOLN Place 1 drop into both eyes 3 (three) times daily as needed. For dry eyes. 07/19/16   [provider]  Melatonin 10 MG TABS Take 10 mg by mouth at bedtime.    [provider]  mirabegron ER (MYRBETRIQ) 25 MG TB24 tablet Take 1 tablet (25 mg total) by mouth daily. 05/26/18   Stark Jock Jude, MD  pantoprazole (PROTONIX) 40 MG tablet Take 40 mg by mouth 2 (two) times daily. 02/09/18   [provider]  polyethylene glycol (MIRALAX / GLYCOLAX) packet  Take 17 g by mouth daily as needed for mild constipation. 05/18/18   Max Sane, MD  potassium chloride (K-DUR) 10 MEQ tablet Take 10 mEq by mouth daily as needed.     [provider]  raloxifene (EVISTA) 60 MG tablet Take 60 mg by mouth daily.  09/21/16   [provider]  rivaroxaban (XARELTO) 20 MG TABS tablet Take 1 tablet (20 mg total) by mouth daily. 05/26/18   Stark Jock Jude, MD  traZODone (DESYREL) 50 MG tablet Take 50 tablets by mouth at bedtime as needed for sleep.  04/12/19   [provider]     Allergies    Adhesive [tape] and Tapentadol   Review of Systems   Review of Systems A comprehensive review of systems was completed and negative except as noted in HPI.    Physical Exam BP (!) 163/92   Pulse (!) 115   Temp 98.1 F (36.7 C)   Resp 20   Ht 5\' 2"  (1.575 m)   Wt 68 kg   SpO2 93%   BMI 27.44 kg/m   Physical Exam Vitals and nursing note reviewed.  Constitutional:      Appearance: Normal appearance.  HENT:     Head: Normocephalic and atraumatic.     Nose: Nose normal.     Mouth/Throat:     Mouth: Mucous membranes are moist.  Eyes:     Extraocular Movements: Extraocular movements intact.     Conjunctiva/sclera: Conjunctivae normal.  Cardiovascular:     Rate and Rhythm: Normal rate.  Pulmonary:     Effort: Pulmonary effort is normal.     Breath sounds: Wheezing and rales present.  Abdominal:     General: Abdomen is flat.     Palpations: Abdomen is soft.     Tenderness: There is no abdominal tenderness.  Musculoskeletal:        General: No swelling. Normal range of motion.     Cervical back: Neck supple.     Right lower leg: Edema (trace) present.     Left lower leg: Edema (trace) present.  Skin:    General: Skin is warm and dry.  Neurological:     General: No focal deficit present.     Mental Status: She is alert.  Psychiatric:        Mood and Affect: Mood normal.     ED Results / Procedures / Treatments   Labs (all labs  ordered are listed, but only abnormal results are displayed) Labs Reviewed  BASIC METABOLIC PANEL - Abnormal; Notable for the following components:      Result Value  Glucose, Bld 134 (*)    Creatinine, Ser 1.11 (*)    GFR, Estimated 45 (*)    All other components within normal limits  BRAIN NATRIURETIC PEPTIDE - Abnormal; Notable for the following components:   B Natriuretic Peptide 349.0 (*)    All other components within normal limits  CBC WITH DIFFERENTIAL/PLATELET - Abnormal; Notable for the following components:   RBC 3.76 (*)    MCV 100.3 (*)    All other components within normal limits  TROPONIN I (HIGH SENSITIVITY) - Abnormal; Notable for the following components:   Troponin I (High Sensitivity) 76 (*)    All other components within normal limits  TROPONIN I (HIGH SENSITIVITY) - Abnormal; Notable for the following components:   Troponin I (High Sensitivity) 76 (*)    All other components within normal limits  RESP PANEL BY RT-PCR (FLU A&B, COVID) [id number]    EKG EKG Interpretation  Date/Time:  Wednesday December 13 2020 15:51:05 EDT Ventricular Rate:  108 PR Interval:  124 QRS Duration: 84 QT Interval:  348 QTC Calculation: 466 R Axis:   -34 Text Interpretation: Atrial fibrillation Left axis deviation Anterior infarct , age undetermined Abnormal ECG Since last tracing Rate faster Confirmed by Calvert Cantor [phone]) on 12/13/2020 4:02:54 PM  Radiology DG Chest 2 View  Result Date: 12/13/2020 CLINICAL DATA:  Shortness of breath leg swelling EXAM: CHEST - 2 VIEW COMPARISON:  Chest radiograph 12/04/2019 FINDINGS: The heart is enlarged. The mediastinal contours are within normal limits. Upper mediastinal surgical clips are again noted. There are increased interstitial markings throughout both lungs. There is no focal consolidation. There is a trace left pleural effusion. There is no significant right effusion. There is no pneumothorax. The bones are diffusely  demineralized. There is multilevel degenerative change throughout the thoracic spine. There is compression deformity of the T10 vertebral body which may be increased from prior. IMPRESSION: 1. Cardiomegaly with increased interstitial markings throughout both lungs raising suspicion for mild pulmonary interstitial edema. 2. Trace left pleural effusion. 3. Compression deformity of the T10 vertebral body may be increased since 2021. Correlate with any symptoms. Dedicated imaging of the thoracic spine may be obtained as indicated. Electronically Signed   By: Valetta Mole M.D.   On: 12/13/2020 16:47    Procedures Procedures  Medications Ordered in the ED Medications  furosemide (LASIX) injection 40 mg (40 mg Intravenous Given 12/13/20 1803)     MDM Rules/Calculators/A&P MDM EKG with afib, mildly increased rate. Not in distress but she does have rales and wheezing. Will check labs, including BNP and Trop, CXR and Covid swab.   ED Course  I have reviewed the triage vital signs and the nursing notes.  Pertinent labs & imaging results that were available during my care of the patient were reviewed by me and considered in my medical decision making (see chart for details).  Clinical Course as of 12/13/20 2058  Wed Dec 13, 2020  1655 CXR with interstitial edema [CS]  1729 CBC is normal.  [CS]  1736 Covid is neg.  [CS]  5732 BNP is mild elevated. Will give a dose of IV Lasix and reassess.  [CS]  2025 Initial trop is mildly elevated, similar to previous from about a year ago. BMP is unremarkable.  [CS]  2023 Patient still having some SOB. Unsure how much urine output she has had as the urine has not been going into her purewick but instead into her diaper. Repeat trop is flat, doubt this represents  and acute cardiac event. Will discuss with Hospitalist for admission.  [CS]  2057 Spoke with Dr. Dione Plover who will evaluate the patient for admission.  [CS]    Clinical Course User Index [CS] Truddie Hidden, MD    Final Clinical Impression(s) / ED Diagnoses Final diagnoses:  Acute on chronic congestive heart failure, unspecified heart failure type [facility])    Rx / DC Orders ED Discharge Orders     None        Truddie Hidden, MD 12/13/20 [phone]

## 2020-12-13 NOTE — H&P (Signed)
[facility] History and Physical  Christine [name] GBT:[id number] DOB: 23-Mar-1920 DOA: 12/13/2020  Referring physician: Dr. Karle Starch PCP: Celene Squibb, MD   Chief Complaint: shortness of breath  HPI: Christine [name] is a 87 y.o. female with hx of hypertension, CVA, hypothyroidism, DVT, OAB, bilateral hip fractures, who presents with shortness of breath.  History obtained from patient and her daughter who is at bedside.  They report that over the last several days patient has become progressively more short of breath.  No chest pain.  Has noticed swelling in her legs which has been persistent for a long time but has seemed to become worse recently.  She has been on Lasix intermittently for leg swelling in the past and has been told she has crackles in her lung in the past.  Daughter tried giving her some Lasix but it does not seem to help much.  No history of heart disease, never smoker.  In the ED vital signs notable for intermittent tachycardia into the low 100s as well has hypertension, other vital signs unremarkable.  BMP was unremarkable with creatinine at her baseline of 1.1, CBC was unremarkable.  BNP was elevated at 349, last check a year ago it was 158.  Troponin was mildly positive at 76 but remained flat 2 hours later when it was again 76, 1 year ago troponin was also roughly the same at 63 and 70.  COVID and flu test were negative.  EKG showed AFib without any acute ischemic changes.  Chest x-ray showed cardiomegaly and signs consistent with pulmonary edema.  She was given IV Lasix and admitted for further management.  Review of Systems:  Pertinent positives and negative per HPI, all others reviewed and negative  Past Medical History:  Diagnosis Date   Cancer ([place])    colon   Carpal tunnel syndrome    Complication of anesthesia    hard time waking up after thyroidectomy   DJD (degenerative joint disease)    Family history of adverse reaction to anesthesia    daughter PONV    Hearing loss    Hypothyroidism    Osteoporosis    Wears glasses    Past Surgical History:  Procedure Laterality Date   ABDOMINAL HYSTERECTOMY     COLON RESECTION  1995   for colon cancer   EYE SURGERY Bilateral    cataract surgery   INTRAMEDULLARY (IM) NAIL INTERTROCHANTERIC Right 01/09/2015   Procedure: INTRAMEDULLARY (IM) NAIL INTERTROCHANTRIC;  Surgeon: Corky Mull, MD;  Location: [facility] ORS;  Service: Orthopedics;  Laterality: Right;   INTRAMEDULLARY (IM) NAIL INTERTROCHANTERIC Left 05/15/2018   Procedure: INTRAMEDULLARY (IM) NAIL INTERTROCHANTRIC - LEFT FEMUR;  Surgeon: Corky Mull, MD;  Location: [facility] ORS;  Service: Orthopedics;  Laterality: Left;   Lung surgery     as a child for empyema   OOPHORECTOMY     THYROID SURGERY  1967   for multinodular goitre   TOTAL HIP REVISION Right 10/24/2016   Procedure: TOTAL HIP REVISION AND HARDWARE REMOVAL;  Surgeon: Corky Mull, MD;  Location: [facility] ORS;  Service: Orthopedics;  Laterality: Right;   Social History:  reports that she has never smoked. She has never used smokeless tobacco. She reports that she does not drink alcohol and does not use drugs.  Allergies  Allergen Reactions   Adhesive [Tape] Other (See Comments)    Red itchy   Tapentadol Other (See Comments)    Red itchy    Family History  Problem Relation  Age of Onset   Heart disease Father    Diabetes Sister    Cancer Sister    Diabetes Brother    Cancer Brother    Cancer Mother      Prior to Admission medications   Medication Sig Start Date End Date Taking? Authorizing Provider  acetaminophen (TYLENOL) 325 MG tablet Take 650 mg by mouth every 6 (six) hours as needed for pain. 01/13/15   [provider]  amLODipine (NORVASC) 5 MG tablet Take 1 tablet (5 mg total) by mouth daily. 05/26/18   Stark Jock Jude, MD  calcium carbonate (OSCAL) 1500 (600 Ca) MG TABS tablet Take 600 mg by mouth 2 (two) times daily with a meal.    [provider]   Cholecalciferol (KP VITAMIN D3) 2000 units CAPS Take 2,000 Units by mouth daily.     [provider]  docusate sodium (COLACE) 100 MG capsule Take 1 capsule (100 mg total) by mouth 2 (two) times daily. Patient taking differently: Take 100 mg by mouth daily.  05/18/18   Max Sane, MD  furosemide (LASIX) 20 MG tablet Take 20 mg by mouth daily as needed.  03/24/19   [provider]  hydrochlorothiazide (HYDRODIURIL) 25 MG tablet Take 1 tablet (25 mg total) by mouth daily. 05/26/18   Stark Jock Jude, MD  levothyroxine (SYNTHROID) 125 MCG tablet Take 125 mcg by mouth daily. 12/21/18   [provider]  LUBRICANT EYE DROPS 0.4-0.3 % SOLN Place 1 drop into both eyes 3 (three) times daily as needed. For dry eyes. 07/19/16   [provider]  Melatonin 10 MG TABS Take 10 mg by mouth at bedtime.    [provider]  mirabegron ER (MYRBETRIQ) 25 MG TB24 tablet Take 1 tablet (25 mg total) by mouth daily. 05/26/18   Stark Jock Jude, MD  pantoprazole (PROTONIX) 40 MG tablet Take 40 mg by mouth 2 (two) times daily. 02/09/18   [provider]  polyethylene glycol (MIRALAX / GLYCOLAX) packet Take 17 g by mouth daily as needed for mild constipation. 05/18/18   Max Sane, MD  potassium chloride (K-DUR) 10 MEQ tablet Take 10 mEq by mouth daily as needed.     [provider]  raloxifene (EVISTA) 60 MG tablet Take 60 mg by mouth daily.  09/21/16   [provider]  rivaroxaban (XARELTO) 20 MG TABS tablet Take 1 tablet (20 mg total) by mouth daily. 05/26/18   Stark Jock Jude, MD  traZODone (DESYREL) 50 MG tablet Take 50 tablets by mouth at bedtime as needed for sleep.  04/12/19   [provider]   Physical Exam: Vitals:   12/13/20 1900 12/13/20 1915 12/13/20 2000 12/13/20 2030  BP: (!) 158/77 (!) 132/95 (!) 156/90 (!) 163/92  Pulse: 92 98 93 (!) 115  Resp: (!) 22 20 20 20   Temp:      SpO2: 93% 93% 94% 93%  Weight:      Height:        Wt Readings from Last 3  Encounters:  12/13/20 68 kg  04/24/19 65.8 kg  04/06/19 68.9 kg     General:  Appears calm and comfortable Eyes: PERRL, normal lids, irises & conjunctiva ENT: seems hard of hearing Neck: no masses Cardiovascular: regulary with mild tachycardia, no m/r/g. No LE edema. JVD elevated above jaw line with bed at 45 degree angle.   Respiratory: Normal respiratory effort. Coarse crackles in lower half of bilateral lung fields.  Abdomen: soft, ntnd Skin: no rash or induration  seen on limited exam Musculoskeletal: grossly normal tone BUE/BLE Psychiatric: grossly normal mood and affect, speech fluent and appropriate Neurologic: grossly non-focal.          Labs on Admission:  Basic Metabolic Panel: Recent Labs  Lab 12/13/20 1646  NA 138  K 3.6  CL 102  CO2 27  GLUCOSE 134*  BUN 13  CREATININE 1.11*  CALCIUM 9.1   Liver Function Tests: No results for input(s): AST, ALT, ALKPHOS, BILITOT, PROT, ALBUMIN in the last 168 hours. No results for input(s): LIPASE, AMYLASE in the last 168 hours. No results for input(s): AMMONIA in the last 168 hours. CBC: Recent Labs  Lab 12/13/20 1646  WBC 9.0  NEUTROABS 6.1  HGB 12.3  HCT 37.7  MCV 100.3*  PLT 279   Cardiac Enzymes: No results for input(s): CKTOTAL, CKMB, CKMBINDEX, TROPONINI in the last 168 hours.  BNP (last 3 results) Recent Labs    12/13/20 1646  BNP 349.0*    ProBNP (last 3 results) No results for input(s): PROBNP in the last 8760 hours.  CBG: No results for input(s): GLUCAP in the last 168 hours.  Radiological Exams on Admission: DG Chest 2 View  Result Date: 12/13/2020 CLINICAL DATA:  Shortness of breath leg swelling EXAM: CHEST - 2 VIEW COMPARISON:  Chest radiograph 12/04/2019 FINDINGS: The heart is enlarged. The mediastinal contours are within normal limits. Upper mediastinal surgical clips are again noted. There are increased interstitial markings throughout both lungs. There is no focal consolidation. There  is a trace left pleural effusion. There is no significant right effusion. There is no pneumothorax. The bones are diffusely demineralized. There is multilevel degenerative change throughout the thoracic spine. There is compression deformity of the T10 vertebral body which may be increased from prior. IMPRESSION: 1. Cardiomegaly with increased interstitial markings throughout both lungs raising suspicion for mild pulmonary interstitial edema. 2. Trace left pleural effusion. 3. Compression deformity of the T10 vertebral body may be increased since 2021. Correlate with any symptoms. Dedicated imaging of the thoracic spine may be obtained as indicated. Electronically Signed   By: Valetta Mole M.D.   On: 12/13/2020 16:47    EKG: Independently reviewed.  A. fib, mild tachycardia, no acute ischemic changes, no significant change compared to prior.  Assessment/Plan Active Problems:   Hip fracture (HCC)   CVA (cerebral vascular accident) ([place])   Volume overload   Essential hypertension   Gastroesophageal reflux disease without esophagitis   History of deep vein thrombosis   Hypothyroidism   Mixed stress and urge urinary incontinence   Overactive bladder   Christine [name] is a 87 y.o. female with hx of hypertension, CVA, hypothyroidism, DVT, OAB, bilateral hip fractures, who presents with shortness of breath and is found to have clinical signs of heart failure and new diagnosis of AFib.  #Volume overload #Elevated troponin Presumptive diagnosis of new heart failure based on symptoms and exam.  Will confirm diagnosis in a.m. and send basic work-up, usual treatment.  Troponin mildly elevated but EKG without ischemic changes, patient without chest pain.  Likely secondary to combination of volume overload and A. fib (see below).  Also with some abdominal swelling, will obtain imaging to evaluate for ascites. - Lasix 20 mg IV twice daily - Strict I's and O's, daily weights - Trend BMP - Telemetry -  Echocardiogram in a.m. - Daily EKG - Admitted abdominal ultrasound - Check ferritin, iron, HIV, TSH, B12  #Afib On initial chart review and interview seem to  be known chronic diagnosis.  On further review of chart this appears to be a new diagnosis, prior scription for Xarelto was due to an acute DVT 3 years prior.  Patient with significantly elevated CHA2DS2-VASc score and is clearly a candidate for anticoagulation, however given age and comorbidities this needs to be weighed carefully.  Furthermore new diagnosis of A. fib may be contributing to likely new diagnosis of heart failure, though I suspect it is more a consequence of her volume overload given she is not in RVR. - Discussed with patient and family in a.m. decision regarding anticoagulation - Hold initiation of beta-blocker for time being in setting of likely acute heart failure  #Chronic medical problems Hypertension-continue amlodipine, hydrochlorothiazide  Hypothyroidism-continue Synthroid  OAB-continue Myrbetriq  Osteoporosis-continue raloxifene  Code Status: DNR/DNI, confirmed DVT Prophylaxis: lovenox Family Communication: Daughter updated at bedside Disposition Plan: Inpatient, Med-Surg   Time spent: 52 min  Clarnce Flock MD/[facility]  Note:  This document was prepared using Systems analyst and may include unintentional dictation errors.

## 2020-12-13 NOTE — ED Notes (Signed)
Linen change and readjusted the patient in the bed.

## 2020-12-14 ENCOUNTER — Inpatient Hospital Stay (HOSPITAL_COMMUNITY): Payer: Medicare HMO

## 2020-12-14 ENCOUNTER — Encounter (HOSPITAL_COMMUNITY): Payer: Self-pay | Admitting: Family Medicine

## 2020-12-14 DIAGNOSIS — I639 Cerebral infarction, unspecified: Secondary | ICD-10-CM

## 2020-12-14 DIAGNOSIS — I509 Heart failure, unspecified: Secondary | ICD-10-CM | POA: Diagnosis not present

## 2020-12-14 DIAGNOSIS — I10 Essential (primary) hypertension: Secondary | ICD-10-CM | POA: Diagnosis not present

## 2020-12-14 DIAGNOSIS — Z515 Encounter for palliative care: Secondary | ICD-10-CM

## 2020-12-14 DIAGNOSIS — Z86718 Personal history of other venous thrombosis and embolism: Secondary | ICD-10-CM | POA: Diagnosis not present

## 2020-12-14 DIAGNOSIS — E039 Hypothyroidism, unspecified: Secondary | ICD-10-CM | POA: Diagnosis not present

## 2020-12-14 LAB — BASIC METABOLIC PANEL
Anion gap: 11 (ref 5–15)
BUN: 12 mg/dL (ref 8–23)
CO2: 31 mmol/L (ref 22–32)
Calcium: 9.1 mg/dL (ref 8.9–10.3)
Chloride: 98 mmol/L (ref 98–111)
Creatinine, Ser: 0.95 mg/dL (ref 0.44–1.00)
GFR, Estimated: 54 mL/min — ABNORMAL LOW (ref >60)
Glucose, Bld: 103 mg/dL — ABNORMAL HIGH (ref 70–99)
Potassium: 3.3 mmol/L — ABNORMAL LOW (ref 3.5–5.1)
Sodium: 140 mmol/L (ref 135–145)

## 2020-12-14 LAB — ECHOCARDIOGRAM COMPLETE
AR max vel: 1.41 cm2
AV Area VTI: 1.45 cm2
AV Area mean vel: 1.5 cm2
AV Mean grad: 4 mmHg
AV Peak grad: 6.6 mmHg
Ao pk vel: 1.28 m/s
Area-P 1/2: 7.59 cm2
Height: 62 in
MV VTI: 1.5 cm2
P 1/2 time: 372 msec
S' Lateral: 2.85 cm
Weight: 2400 oz

## 2020-12-14 LAB — CBC
HCT: 35.6 % — ABNORMAL LOW (ref 36.0–46.0)
Hemoglobin: 11.6 g/dL — ABNORMAL LOW (ref 12.0–15.0)
MCH: 32.2 pg (ref 26.0–34.0)
MCHC: 32.6 g/dL (ref 30.0–36.0)
MCV: 98.9 fL (ref 80.0–100.0)
Platelets: 269 10*3/uL (ref 150–400)
RBC: 3.6 MIL/uL — ABNORMAL LOW (ref 3.87–5.11)
RDW: 13.6 % (ref 11.5–15.5)
WBC: 9 10*3/uL (ref 4.0–10.5)
nRBC: 0 % (ref 0.0–0.2)

## 2020-12-14 MED ORDER — ASPIRIN EC 81 MG PO TBEC
81.0000 mg | DELAYED_RELEASE_TABLET | Freq: Every day | ORAL | Status: DC
  Administered 2020-12-15: 81 mg via ORAL
  Filled 2020-12-14: qty 1

## 2020-12-14 MED ORDER — POLYETHYLENE GLYCOL 3350 17 G PO PACK: 17.0000 g | PACK | Freq: Every day | ORAL | Status: DC | PRN

## 2020-12-14 MED ORDER — PANTOPRAZOLE SODIUM 40 MG PO TBEC
40.0000 mg | DELAYED_RELEASE_TABLET | Freq: Two times a day (BID) | ORAL | Status: DC
  Administered 2020-12-14 – 2020-12-15 (×2): 40 mg via ORAL
  Filled 2020-12-14 (×2): qty 1

## 2020-12-14 MED ORDER — METOPROLOL TARTRATE 25 MG PO TABS
12.5000 mg | ORAL_TABLET | Freq: Two times a day (BID) | ORAL | Status: DC
  Administered 2020-12-14: 12.5 mg via ORAL
  Filled 2020-12-14 (×2): qty 1

## 2020-12-14 MED ORDER — POLYETHYL GLYCOL-PROPYL GLYCOL 0.4-0.3 % OP SOLN: 1.0000 [drp] | Freq: Three times a day (TID) | OPHTHALMIC | Status: DC | PRN

## 2020-12-14 MED ORDER — POLYVINYL ALCOHOL 1.4 % OP SOLN: 1.0000 [drp] | Freq: Three times a day (TID) | OPHTHALMIC | Status: DC | PRN

## 2020-12-14 MED ORDER — POTASSIUM CHLORIDE CRYS ER 20 MEQ PO TBCR
40.0000 meq | EXTENDED_RELEASE_TABLET | Freq: Once | ORAL | Status: AC
  Administered 2020-12-14: 40 meq via ORAL
  Filled 2020-12-14: qty 2

## 2020-12-14 MED ORDER — LEVOTHYROXINE SODIUM 75 MCG PO TABS
150.0000 ug | ORAL_TABLET | Freq: Every day | ORAL | Status: DC
  Administered 2020-12-15: 150 ug via ORAL
  Filled 2020-12-14: qty 2

## 2020-12-14 MED ORDER — OXYCODONE HCL 5 MG PO TABS: 5.0000 mg | ORAL_TABLET | Freq: Four times a day (QID) | ORAL | Status: DC | PRN

## 2020-12-14 NOTE — Progress Notes (Signed)
EKG done, shown to nurse and placed on chart 

## 2020-12-14 NOTE — Progress Notes (Signed)
*  PRELIMINARY RESULTS* Echocardiogram 2D Echocardiogram has been performed.  Christine [name] 12/14/2020, 9:34 AM

## 2020-12-14 NOTE — Progress Notes (Signed)
Palliative: Thank you for this consult. Unfortunately due to high volume of consults there will be a delay in a Palliative Provider seeing this patient.  Palliative Medicine will return to service on 12/18/20 and will see patient at that time.  No charge Quinn Axe, NP Palliative Medicine Please call Palliative Medicine team phone with any questions [phone]. For individual providers please see AMION

## 2020-12-14 NOTE — Plan of Care (Signed)
  Problem: Acute Rehab PT Goals(only PT should resolve) Goal: Pt Will Go Supine/Side To Sit Outcome: Progressing Flowsheets (Taken 12/14/2020 1524) Pt will go Supine/Side to Sit: with supervision Goal: Patient Will Transfer Sit To/From Stand Outcome: Progressing Flowsheets (Taken 12/14/2020 1524) Patient will transfer sit to/from stand:  with min guard assist  with minimal assist Goal: Pt Will Transfer Bed To Chair/Chair To Bed Outcome: Progressing Flowsheets (Taken 12/14/2020 1524) Pt will Transfer Bed to Chair/Chair to Bed:  min guard assist  with min assist Goal: Pt Will Ambulate Outcome: Progressing Flowsheets (Taken 12/14/2020 1524) Pt will Ambulate:  50 feet  with min guard assist  with minimal assist  with rolling walker   3:34 PM, 12/14/20 Lonell Grandchild, MPT Physical Therapist with [facility] [phone] office [phone] mobile phone

## 2020-12-14 NOTE — Progress Notes (Signed)
Patient alert with some forgetfulness. She has been up to bedside commode a couple of times for stools. No complaints of pain. Family has been at bedside. Has purewick for fluid output monitoring.

## 2020-12-14 NOTE — Evaluation (Signed)
Physical Therapy Evaluation Patient Details Name: HERMILA MILLIS MRN: [id number] DOB: [birth year] Today's Date: 12/14/2020  History of Present Illness  SNEHA WILLIG is a 87 y.o. female with hx of hypertension, CVA, hypothyroidism, DVT, OAB, bilateral hip fractures, who presents with shortness of breath.     History obtained from patient and her daughter who is at bedside.  They report that over the last several days patient has become progressively more short of breath.  No chest pain.  Has noticed swelling in her legs which has been persistent for a long time but has seemed to become worse recently.  She has been on Lasix intermittently for leg swelling in the past and has been told she has crackles in her lung in the past.  Daughter tried giving her some Lasix but it does not seem to help much.  No history of heart disease, never smoker.   Clinical Impression  Patient functioning near baseline for functional mobility and gait. Patient able to transfer to [place] to urinate prior to ambulating to doorway without loss of balance and limited mostly due to fatigue.  Patient tolerated sitting up in chair after therapy with her family member present in room.  Patient will benefit from continued physical therapy in hospital and recommended venue below to increase strength, balance, endurance for safe ADLs and gait.        Recommendations for follow up therapy are one component of a multi-disciplinary discharge planning process, led by the attending physician.  Recommendations may be updated based on patient status, additional functional criteria and insurance authorization.  Follow Up Recommendations Home health PT;Supervision for mobility/OOB;Supervision - Intermittent    Equipment Recommendations  None recommended by PT    Recommendations for Other Services       Precautions / Restrictions Precautions Precautions: Fall Restrictions Weight Bearing Restrictions: No      Mobility  Bed  Mobility Overal bed mobility: Needs Assistance Bed Mobility: Supine to Sit     Supine to sit: Min assist     General bed mobility comments: increased time, labored movement    Transfers Overall transfer level: Needs assistance Equipment used: Rolling walker (2 wheeled) Transfers: Sit to/from [facility] Sit to Stand: Min assist Stand pivot transfers: Min assist       General transfer comment: slow labored movement  Ambulation/Gait Ambulation/Gait assistance: Min assist Gait Distance (Feet): 30 Feet Assistive device: Rolling walker (2 wheeled) Gait Pattern/deviations: Decreased step length - right;Decreased step length - left;Decreased stride length;Trunk flexed Gait velocity: decreased   General Gait Details: slow slightly labored cadence without loss of balance, limited mostly due to fatigue  Stairs            Wheelchair Mobility    Modified Rankin (Stroke Patients Only)       Balance Overall balance assessment: Needs assistance Sitting-balance support: Feet supported;No upper extremity supported Sitting balance-Leahy Scale: Fair Sitting balance - Comments: fair/good seated at EOB   Standing balance support: During functional activity;Bilateral upper extremity supported Standing balance-Leahy Scale: Fair Standing balance comment: using RW                             Pertinent Vitals/Pain Pain Assessment: No/denies pain    Home Living Family/patient expects to be discharged to:: Private residence Living Arrangements: Children Available Help at Discharge: Family;Available 24 hours/day Type of Home: House Home Access: Stairs to enter Entrance Stairs-Rails: Right Entrance Stairs-Number of Steps:  2 Home Layout: One level Home Equipment: Walker - 2 wheels;Walker - 4 wheels;Cane - single point;Bedside commode;Shower seat      Prior Function Level of Independence: Needs assistance   Gait / Transfers Assistance Needed:  assisted household ambulator using RW  ADL's / Homemaking Assistance Needed: assisted by family        Hand Dominance   Dominant Hand: Right    Extremity/Trunk Assessment   Upper Extremity Assessment Upper Extremity Assessment: Overall WFL for tasks assessed    Lower Extremity Assessment Lower Extremity Assessment: Generalized weakness    Cervical / Trunk Assessment Cervical / Trunk Assessment: Kyphotic  Communication   Communication: HOH  Cognition Arousal/Alertness: Awake/alert Behavior During Therapy: WFL for tasks assessed/performed Overall Cognitive Status: Within Functional Limits for tasks assessed                                 General Comments: has difficulty answering questions mostly due to very [facility]      General Comments      Exercises     Assessment/Plan    PT Assessment Patient needs continued PT services  PT Problem List Decreased strength;Decreased activity tolerance;Decreased balance;Decreased mobility       PT Treatment Interventions DME instruction;Gait training;Stair training;Functional mobility training;Therapeutic activities;Therapeutic exercise;Patient/family education;Balance training    PT Goals (Current goals can be found in the Care Plan section)  Acute Rehab PT Goals Patient Stated Goal: return home with family to assist PT Goal Formulation: With patient Time For Goal Achievement: 12/18/20 Potential to Achieve Goals: Good    Frequency Min 3X/week   Barriers to discharge        Co-evaluation               AM-PAC PT "6 Clicks" Mobility  Outcome Measure Help needed turning from your back to your side while in a flat bed without using bedrails?: None Help needed moving from lying on your back to sitting on the side of a flat bed without using bedrails?: A Little Help needed moving to and from a bed to a chair (including a wheelchair)?: A Little Help needed standing up from a chair using your arms (e.g.,  wheelchair or bedside chair)?: A Little Help needed to walk in hospital room?: A Little Help needed climbing 3-5 steps with a railing? : A Lot 6 Click Score: 18    End of Session   Activity Tolerance: Patient tolerated treatment well;Patient limited by fatigue Patient left: in chair;with call bell/phone within reach;with family/visitor present Nurse Communication: Mobility status PT Visit Diagnosis: Unsteadiness on feet (R26.81);Other abnormalities of gait and mobility (R26.89);Muscle weakness (generalized) (M62.81)    Time: 5456-2563 PT Time Calculation (min) (ACUTE ONLY): 31 min   Charges:   PT Evaluation $PT Eval Moderate Complexity: 1 Mod PT Treatments $Therapeutic Activity: 23-37 mins        3:23 PM, 12/14/20 Lonell Grandchild, MPT Physical Therapist with [facility] [phone] office [phone] mobile phone

## 2020-12-14 NOTE — Progress Notes (Signed)
PROGRESS NOTE   Christine [name]  QIH:[id number] DOB: [birth year] DOA: 12/13/2020 PCP: Celene Squibb, MD   Chief Complaint  Patient presents with   Shortness of Breath   Level of care: Telemetry  Brief Admission History:  87 y.o. female with hx of hypertension, CVA, hypothyroidism, DVT, OAB, bilateral hip fractures, who presents with shortness of breath.  She was noted to be in CHF and new findings of atrial fibrillation was captured on EKG.    Assessment & Plan:   Active Problems:   Hip fracture (HCC)   CVA (cerebral vascular accident) ([place])   Volume overload   Essential hypertension   Gastroesophageal reflux disease without esophagitis   History of deep vein thrombosis   Hypothyroidism   Mixed stress and urge urinary incontinence   Overactive bladder  Acute Heart Failure  -likely precipitated by Afib - responding to IV lasix, continue for now - follow up 2D echocardiogram   New Atrial Fibrillation  - Pt had already been on rivaxoban for another reason - metoprolol 12.5 mg BID ordered, titrate dose as needed - follow up 2D echocardiogram - follow up TSH screen  Essential hypertension  - resumed home meds - added metoprolol 12.5 mg BID  OAB  - resumed home med  Hypothyroidism  - resume home levothyroxine    DVT prophylaxis: rivaroxaban  Code Status: DNR  Family Communication: bedside update with daughter 9/29  Disposition: anticipating return home with home health  Status is: Inpatient  Remains inpatient appropriate because:Inpatient level of care appropriate due to severity of illness  Dispo: The patient is from: Home              Anticipated d/c is to: Home              Patient currently is not medically stable to d/c.   Difficult to place patient No  Consultants:    Procedures:  Echocardiogram 12/14/2020 IMPRESSIONS   1. Left ventricular ejection fraction, by estimation, is 55 to 60%. The  left ventricle has normal function. The left ventricle has no  regional  wall motion abnormalities. There is moderate concentric left ventricular  hypertrophy. Left ventricular  diastolic parameters are indeterminate.   2. RV-RA gradient 41 mmHg suggesting mild to moderate elevation in RVSP.  Right ventricular systolic function is normal. The right ventricular size  is normal.   3. Left atrial size was mildly dilated.   4. The mitral valve is degenerative. Mild mitral valve regurgitation.   5. The aortic valve is tricuspid. There is moderate calcification of the  aortic valve. Aortic valve regurgitation is mild to moderate. Mild to  moderate aortic valve sclerosis/calcification is present, without any  evidence of aortic stenosis. Aortic  regurgitation PHT measures 372 msec. Aortic valve mean gradient measures  4.0 mmHg.   6. Unable to estimate CVP.   Antimicrobials:  N/a    Subjective: Pt says she is feeling much better after diuresing some from lasix.  No CP. No palpitation.   Objective: Vitals:   12/14/20 0300 12/14/20 0630 12/14/20 1432 12/14/20 1500  BP: (!) 158/77 (!) 161/71 (!) 157/71   Pulse: 98 (!) 101 (!) 116 (!) 104  Resp: 18 18 17    Temp: (!) 97.4 F (36.3 C) 98.6 F (37 C) 98.4 F (36.9 C)   TempSrc:   Oral   SpO2: 98% 96% 99%   Weight:      Height:        Intake/Output Summary (Last 24  hours) at 12/14/2020 1557 Last data filed at 12/14/2020 0500 Gross per 24 hour  Intake 440 ml  Output 750 ml  Net -310 ml   Filed Weights   12/13/20 1549  Weight: 68 kg    Examination:  General exam: Appears calm and comfortable  Respiratory system: Clear to auscultation. Respiratory effort normal. Cardiovascular system: irregularly irregular, normal S1 & S2 heard. No JVD, murmurs, rubs, gallops or clicks. No pedal edema. Gastrointestinal system: Abdomen is nondistended, soft and nontender. No organomegaly or masses felt. Normal bowel sounds heard. Central nervous system: Alert and oriented. No focal neurological  deficits. Extremities: Symmetric 5 x 5 power. Skin: No rashes, lesions or ulcers Psychiatry: Judgement and insight appear normal. Mood & affect appropriate.   Data Reviewed: I have personally reviewed following labs and imaging studies  CBC: Recent Labs  Lab 12/13/20 1646 12/14/20 0534  WBC 9.0 9.0  NEUTROABS 6.1  --   HGB 12.3 11.6*  HCT 37.7 35.6*  MCV 100.3* 98.9  PLT 279 921    Basic Metabolic Panel: Recent Labs  Lab 12/13/20 1646 12/14/20 0534  NA 138 140  K 3.6 3.3*  CL 102 98  CO2 27 31  GLUCOSE 134* 103*  BUN 13 12  CREATININE 1.11* 0.95  CALCIUM 9.1 9.1    GFR: Estimated Creatinine Clearance: 29.9 mL/min (by C-G formula based on SCr of 0.95 mg/dL).  Liver Function Tests: No results for input(s): AST, ALT, ALKPHOS, BILITOT, PROT, ALBUMIN in the last 168 hours.  CBG: No results for input(s): GLUCAP in the last 168 hours.  Recent Results (from the past 240 hour(s))  Resp Panel by RT-PCR (Flu A&B, Covid) Nasopharyngeal Swab     Status: None   Collection Time: 12/13/20  4:40 PM   Specimen: Nasopharyngeal Swab; Nasopharyngeal(NP) swabs in vial transport medium  Result Value Ref Range Status   SARS Coronavirus 2 by RT PCR NEGATIVE NEGATIVE Final    Comment: (NOTE) SARS-CoV-2 target nucleic acids are NOT DETECTED.  The SARS-CoV-2 RNA is generally detectable in upper respiratory specimens during the acute phase of infection. The lowest concentration of SARS-CoV-2 viral copies this assay can detect is 138 copies/mL. A negative result does not preclude SARS-Cov-2 infection and should not be used as the sole basis for treatment or other patient management decisions. A negative result may occur with  improper specimen collection/handling, submission of specimen other than nasopharyngeal swab, presence of viral mutation(s) within the areas targeted by this assay, and inadequate number of viral copies(<138 copies/mL). A negative result must be combined  with clinical observations, patient history, and epidemiological information. The expected result is Negative.  Fact Sheet for Patients:  EntrepreneurPulse.com.au  Fact Sheet for Healthcare Providers:  IncredibleEmployment.be  This test is no t yet approved or cleared by the [place] FDA and  has been authorized for detection and/or diagnosis of SARS-CoV-2 by FDA under an Emergency Use Authorization (EUA). This EUA will remain  in effect (meaning this test can be used) for the duration of the COVID-19 declaration under Section 564(b)(1) of the Act, 21 U.S.C.section 360bbb-3(b)(1), unless the authorization is terminated  or revoked sooner.       Influenza A by PCR NEGATIVE NEGATIVE Final   Influenza B by PCR NEGATIVE NEGATIVE Final    Comment: (NOTE) The Xpert Xpress SARS-CoV-2/FLU/RSV plus assay is intended as an aid in the diagnosis of influenza from Nasopharyngeal swab specimens and should not be used as a sole basis for treatment. Nasal washings  and aspirates are unacceptable for Xpert Xpress SARS-CoV-2/FLU/RSV testing.  Fact Sheet for Patients: EntrepreneurPulse.com.au  Fact Sheet for Healthcare Providers: IncredibleEmployment.be  This test is not yet approved or cleared by the [place] FDA and has been authorized for detection and/or diagnosis of SARS-CoV-2 by FDA under an Emergency Use Authorization (EUA). This EUA will remain in effect (meaning this test can be used) for the duration of the COVID-19 declaration under Section 564(b)(1) of the Act, 21 U.S.C. section 360bbb-3(b)(1), unless the authorization is terminated or revoked.  Performed at [facility], [address], [place], [place] [address]      Radiology Studies: DG Chest 2 View  Result Date: 12/13/2020 CLINICAL DATA:  Shortness of breath leg swelling EXAM: CHEST - 2 VIEW COMPARISON:  Chest radiograph 12/04/2019 FINDINGS:  The heart is enlarged. The mediastinal contours are within normal limits. Upper mediastinal surgical clips are again noted. There are increased interstitial markings throughout both lungs. There is no focal consolidation. There is a trace left pleural effusion. There is no significant right effusion. There is no pneumothorax. The bones are diffusely demineralized. There is multilevel degenerative change throughout the thoracic spine. There is compression deformity of the T10 vertebral body which may be increased from prior. IMPRESSION: 1. Cardiomegaly with increased interstitial markings throughout both lungs raising suspicion for mild pulmonary interstitial edema. 2. Trace left pleural effusion. 3. Compression deformity of the T10 vertebral body may be increased since 2021. Correlate with any symptoms. Dedicated imaging of the thoracic spine may be obtained as indicated. Electronically Signed   By: Valetta Mole M.D.   On: 12/13/2020 16:47   US Abdomen Limited  Result Date: 12/14/2020 CLINICAL DATA:  Assess for ascites.  Abdominal distention EXAM: LIMITED ABDOMEN ULTRASOUND FOR ASCITES TECHNIQUE: Limited ultrasound survey for ascites was performed in all four abdominal quadrants. COMPARISON:  None. FINDINGS: No ascites seen in the 4 quadrants of the abdomen. IMPRESSION: No evidence of ascites. Electronically Signed   By: Rolm Baptise M.D.   On: 12/14/2020 11:50   DG CHEST PORT 1 VIEW  Result Date: 12/14/2020 CLINICAL DATA:  Short of breath.  Pulmonary edema suspected. EXAM: PORTABLE CHEST 1 VIEW COMPARISON:  12/13/2020 FINDINGS: Stable cardiac enlargement. Aortic atherosclerotic calcifications. No pleural effusion or edema. No airspace densities. Diffuse chronic interstitial coarsening noted bilaterally. Degenerative changes are noted within both glenohumeral joints. No acute osseous abnormality. IMPRESSION: No acute cardiopulmonary abnormalities. Electronically Signed   By: Kerby Moors M.D.   On:  12/14/2020 14:26   ECHOCARDIOGRAM COMPLETE  Result Date: 12/14/2020    ECHOCARDIOGRAM REPORT   Patient Name:   Christine [name] Date of Exam: 12/14/2020 Medical Rec #:  [id number]      Height:       62.0 in Accession #:    [id number]     Weight:       150.0 lb Date of Birth:  02-24-1921      BSA:          1.692 m Patient Age:    42 years       BP:           161/71 mmHg Patient Gender: F              HR:           111 bpm. Exam Location:  [facility] Procedure: 2D Echo, Cardiac Doppler and Color Doppler Indications:    Congestive Heart Failure  History:  Patient has prior history of Echocardiogram examinations, most                 recent 05/20/2018. Stroke, Arrythmias:LBBB; Risk                 Factors:Hypertension.  Sonographer:    Wenda Low Referring Phys: [id number] MATTHEW M ECKSTAT IMPRESSIONS  1. Left ventricular ejection fraction, by estimation, is 55 to 60%. The left ventricle has normal function. The left ventricle has no regional wall motion abnormalities. There is moderate concentric left ventricular hypertrophy. Left ventricular diastolic parameters are indeterminate.  2. RV-RA gradient 41 mmHg suggesting mild to moderate elevation in RVSP. Right ventricular systolic function is normal. The right ventricular size is normal.  3. Left atrial size was mildly dilated.  4. The mitral valve is degenerative. Mild mitral valve regurgitation.  5. The aortic valve is tricuspid. There is moderate calcification of the aortic valve. Aortic valve regurgitation is mild to moderate. Mild to moderate aortic valve sclerosis/calcification is present, without any evidence of aortic stenosis. Aortic regurgitation PHT measures 372 msec. Aortic valve mean gradient measures 4.0 mmHg.  6. Unable to estimate CVP. Comparison(s): Prior images reviewed side by side. LVEF is normal range. FINDINGS  Left Ventricle: Left ventricular ejection fraction, by estimation, is 55 to 60%. The left ventricle has normal function. The left  ventricle has no regional wall motion abnormalities. The left ventricular internal cavity size was normal in size. There is  moderate concentric left ventricular hypertrophy. Left ventricular diastolic parameters are indeterminate. Right Ventricle: RV-RA gradient 41 mmHg suggesting mild to moderate elevation in RVSP. The right ventricular size is normal. No increase in right ventricular wall thickness. Right ventricular systolic function is normal. Left Atrium: Left atrial size was mildly dilated. Right Atrium: Right atrial size was normal in size. Pericardium: There is no evidence of pericardial effusion. Presence of pericardial fat pad. Mitral Valve: The mitral valve is degenerative in appearance. There is mild thickening of the mitral valve leaflet(s). Mild to moderate mitral annular calcification. Mild mitral valve regurgitation. MV peak gradient, 8.9 mmHg. The mean mitral valve gradient is 4.0 mmHg. Tricuspid Valve: The tricuspid valve is grossly normal. Tricuspid valve regurgitation is mild. Aortic Valve: The aortic valve is tricuspid. There is moderate calcification of the aortic valve. Aortic valve regurgitation is mild to moderate. Aortic regurgitation PHT measures 372 msec. Mild to moderate aortic valve sclerosis/calcification is present, without any evidence of aortic stenosis. Aortic valve mean gradient measures 4.0 mmHg. Aortic valve peak gradient measures 6.6 mmHg. Aortic valve area, by VTI measures 1.45 cm. Pulmonic Valve: The pulmonic valve was grossly normal. Pulmonic valve regurgitation is trivial. Aorta: The aortic root is normal in size and structure. Venous: Unable to estimate CVP. The inferior vena cava was not well visualized. IAS/Shunts: No atrial level shunt detected by color flow Doppler.  LEFT VENTRICLE PLAX 2D LVIDd:         3.80 cm  Diastology LVIDs:         2.85 cm  LV e' medial:    6.85 cm/s LV PW:         1.50 cm  LV E/e' medial:  19.6 LV IVS:        1.30 cm  LV e' lateral:   7.83  cm/s LVOT diam:     1.80 cm  LV E/e' lateral: 17.1 LV SV:         32 LV SV Index:   19 LVOT Area:  2.54 cm  RIGHT VENTRICLE RV Basal diam:  3.25 cm RV Mid diam:    2.30 cm RV S prime:     17.10 cm/s TAPSE (M-mode): 1.6 cm LEFT ATRIUM             Index       RIGHT ATRIUM           Index LA diam:        3.30 cm 1.95 cm/m  RA Area:     19.10 cm LA Vol (A2C):   76.9 ml 45.46 ml/m RA Volume:   47.10 ml  27.84 ml/m LA Vol (A4C):   56.7 ml 33.52 ml/m LA Biplane Vol: 67.8 ml 40.08 ml/m  AORTIC VALVE                   PULMONIC VALVE AV Area (Vmax):    1.41 cm    PV Vmax:       0.63 m/s AV Area (Vmean):   1.50 cm    PV Peak grad:  1.6 mmHg AV Area (VTI):     1.45 cm AV Vmax:           128.00 cm/s AV Vmean:          87.600 cm/s AV VTI:            0.219 m AV Peak Grad:      6.6 mmHg AV Mean Grad:      4.0 mmHg LVOT Vmax:         70.70 cm/s LVOT Vmean:        51.700 cm/s LVOT VTI:          0.125 m LVOT/AV VTI ratio: 0.57 AI PHT:            372 msec  AORTA Ao Root diam: 3.60 cm MITRAL VALVE                TRICUSPID VALVE MV Area (PHT): 7.59 cm     TR Peak grad:   41.0 mmHg MV Area VTI:   1.50 cm     TR Vmax:        320.00 cm/s MV Peak grad:  8.9 mmHg MV Mean grad:  4.0 mmHg     SHUNTS MV Vmax:       1.49 m/s     Systemic VTI:  0.12 m MV Vmean:      90.8 cm/s    Systemic Diam: 1.80 cm MV Decel Time: 100 msec MV E velocity: 134.00 cm/s Rozann Lesches MD Electronically signed by Rozann Lesches MD Signature Date/Time: 12/14/2020/9:48:07 AM    Final     Scheduled Meds:  amLODipine  5 mg Oral Daily   enoxaparin (LOVENOX) injection  30 mg Subcutaneous Q24H   furosemide  20 mg Intravenous BID   hydrochlorothiazide  25 mg Oral Daily   levothyroxine  125 mcg Oral Daily   metoprolol tartrate  12.5 mg Oral BID   mirabegron ER  25 mg Oral Daily   potassium chloride  20 mEq Oral BID   raloxifene  60 mg Oral Daily   sodium chloride flush  3 mL Intravenous Q12H   Continuous Infusions:   LOS: 1 day   Time spent:  38 mins  Ailen Strauch Wynetta Emery, MD How to contact the [facility] Attending or Consulting provider [place] or covering provider during after hours 7P -7A, for this patient?  Check the care team in [facility] and look for a) attending/consulting [facility] provider  listed and b) the [facility] team listed Log into [email or web address] and use [place]'s universal password to access. If you do not have the password, please contact the hospital operator. Locate the [facility] provider you are looking for under [facility] and page to a number that you can be directly reached. If you still have difficulty reaching the provider, please page the [facility] (Director on Call) for the Hospitalists listed on amion for assistance.  12/14/2020, 3:57 PM

## 2020-12-14 NOTE — Progress Notes (Signed)
Patient very restless. Dr. Wynetta Emery updated. Family that is in room has not allowed patient to be compliant with fluid restriction. Does not stop patient from picking at lines and attached treatment modalities. Frequent monitoring required.

## 2020-12-15 DIAGNOSIS — I5031 Acute diastolic (congestive) heart failure: Secondary | ICD-10-CM

## 2020-12-15 DIAGNOSIS — E039 Hypothyroidism, unspecified: Secondary | ICD-10-CM | POA: Diagnosis not present

## 2020-12-15 DIAGNOSIS — I10 Essential (primary) hypertension: Secondary | ICD-10-CM | POA: Diagnosis not present

## 2020-12-15 DIAGNOSIS — E8770 Fluid overload, unspecified: Secondary | ICD-10-CM | POA: Diagnosis not present

## 2020-12-15 LAB — BASIC METABOLIC PANEL
Anion gap: 9 (ref 5–15)
BUN: 17 mg/dL (ref 8–23)
CO2: 29 mmol/L (ref 22–32)
Calcium: 8.5 mg/dL — ABNORMAL LOW (ref 8.9–10.3)
Chloride: 96 mmol/L — ABNORMAL LOW (ref 98–111)
Creatinine, Ser: 1.41 mg/dL — ABNORMAL HIGH (ref 0.44–1.00)
GFR, Estimated: 34 mL/min — ABNORMAL LOW (ref >60)
Glucose, Bld: 118 mg/dL — ABNORMAL HIGH (ref 70–99)
Potassium: 4.1 mmol/L (ref 3.5–5.1)
Sodium: 134 mmol/L — ABNORMAL LOW (ref 135–145)

## 2020-12-15 LAB — TROPONIN I (HIGH SENSITIVITY): Troponin I (High Sensitivity): 88 ng/L — ABNORMAL HIGH (ref <18)

## 2020-12-15 LAB — MAGNESIUM: Magnesium: 1.9 mg/dL (ref 1.7–2.4)

## 2020-12-15 MED ORDER — METOPROLOL TARTRATE 25 MG PO TABS
25.0000 mg | ORAL_TABLET | Freq: Two times a day (BID) | ORAL | Status: DC
  Administered 2020-12-15: 25 mg via ORAL
  Filled 2020-12-15: qty 1

## 2020-12-15 MED ORDER — METOPROLOL TARTRATE 25 MG PO TABS: 25.0000 mg | ORAL_TABLET | Freq: Two times a day (BID) | ORAL | 0 refills | Status: AC

## 2020-12-15 MED ORDER — LIVING BETTER WITH HEART FAILURE BOOK
Freq: Once | Status: AC
  Administered 2020-12-15: 1

## 2020-12-15 NOTE — Discharge Instructions (Signed)
IMPORTANT INFORMATION: PAY CLOSE ATTENTION   PHYSICIAN DISCHARGE INSTRUCTIONS  Follow with Primary care provider  Adamcik, John Z, MD  and other consultants as instructed by your Hospitalist Physician  SEEK MEDICAL CARE OR RETURN TO EMERGENCY ROOM IF SYMPTOMS COME BACK, WORSEN OR NEW PROBLEM DEVELOPS   Please note: You were cared for by a hospitalist during your hospital stay. Every effort will be made to forward records to your primary care provider.  You can request that your primary care provider send for your hospital records if they have not received them.  Once you are discharged, your primary care physician will handle any further medical issues. Please note that NO REFILLS for any discharge medications will be authorized once you are discharged, as it is imperative that you return to your primary care physician (or establish a relationship with a primary care physician if you do not have one) for your post hospital discharge needs so that they can reassess your need for medications and monitor your lab values.  Please get a complete blood count and chemistry panel checked by your Primary MD at your next visit, and again as instructed by your Primary MD.  Get Medicines reviewed and adjusted: Please take all your medications with you for your next visit with your Primary MD  Laboratory/radiological data: Please request your Primary MD to go over all hospital tests and procedure/radiological results at the follow up, please ask your primary care provider to get all Hospital records sent to his/her office.  In some cases, they will be blood work, cultures and biopsy results pending at the time of your discharge. Please request that your primary care provider follow up on these results.  If you are diabetic, please bring your blood sugar readings with you to your follow up appointment with primary care.    Please call and make your follow up appointments as soon as possible.    Also Note the  following: If you experience worsening of your admission symptoms, develop shortness of breath, life threatening emergency, suicidal or homicidal thoughts you must seek medical attention immediately by calling 911 or calling your MD immediately  if symptoms less severe.  You must read complete instructions/literature along with all the possible adverse reactions/side effects for all the Medicines you take and that have been prescribed to you. Take any new Medicines after you have completely understood and accpet all the possible adverse reactions/side effects.   Do not drive when taking Pain medications or sleeping medications (Benzodiazepines)  Do not take more than prescribed Pain, Sleep and Anxiety Medications. It is not advisable to combine anxiety,sleep and pain medications without talking with your primary care practitioner  Special Instructions: If you have smoked or chewed Tobacco  in the last 2 yrs please stop smoking, stop any regular Alcohol  and or any Recreational drug use.  Wear Seat belts while driving.  Do not drive if taking any narcotic, mind altering or controlled substances or recreational drugs or alcohol.       

## 2020-12-15 NOTE — Progress Notes (Signed)
Patient's daughter states understanding of discharge instructions

## 2020-12-15 NOTE — TOC Transition Note (Signed)
Transition of Care [facility]) - CM/SW Discharge Note   Patient Details  Name: Christine [name] MRN: [id number] Date of Birth: 12/17/1920  [facility] CM/SW Contact:  Boneta Lucks, RN Phone Number: 12/15/2020, 11:59 AM   Clinical Narrative:   Patient medically ready for discharge.  TOC consulted for CHF- RN gave daughter Living better with CHF book. Daughter will review, and follow up with MD with questions. They currently do not do daily weights.  PT is recommending HHPT, family is agreeable. Requested [facility].  Marlon Pel accepted the referral for HHPT/OT.     Barriers to Discharge: Barriers Resolved  Patient Goals and CMS Choice Patient states their goals for this hospitalization and ongoing recovery are:: to go home. CMS Medicare.gov Compare Post Acute Care list provided to:: Patient Represenative (must comment) Choice offered to / list presented to : Adult Children  Discharge Placement             Patient chooses bed at:  (Home) Patient to be transferred to facility by: Family Name of family member notified: [place] Patient and family notified of of transfer: 12/15/20  Discharge Plan and Services       HH Arranged: PT, OT [facility] Agency: [place] Date [place]: 12/15/20 Time Normal: 1157 Representative spoke with at [place]: [place]  Readmission Risk Interventions Readmission Risk Prevention Plan 12/15/2020  Transportation Screening Complete  PCP or Specialist Appt within 5-7 Days Complete  Home Care Screening Complete  Medication Review (RN CM) Complete  Some recent data might be hidden

## 2020-12-15 NOTE — Discharge Summary (Addendum)
Physician Discharge Summary  Christine [name] HYW:[id number] DOB: 03/27/1920 DOA: 12/13/2020  PCP: Celene Squibb, MD  Admit date: 12/13/2020 Discharge date: 12/15/2020  Admitted From:  Home  Disposition: Home with [facility]   Recommendations for Outpatient Follow-up:  Follow up with PCP in 1 weeks  Home Health:  PT/OT   Discharge Condition: STABLE   CODE STATUS: DNR  DIET: resume prior home diet    Brief Hospitalization Summary: Please see all hospital notes, images, labs for full details of the hospitalization. ADMISSION HPI:  Christine [name] is a 87 y.o. female with hx of hypertension, CVA, hypothyroidism, DVT, OAB, bilateral hip fractures, who presents with shortness of breath.   History obtained from patient and her daughter who is at bedside.  They report that over the last several days patient has become progressively more short of breath.  No chest pain.  Has noticed swelling in her legs which has been persistent for a long time but has seemed to become worse recently.  She has been on Lasix intermittently for leg swelling in the past and has been told she has crackles in her lung in the past.  Daughter tried giving her some Lasix but it does not seem to help much.  No history of heart disease, never smoker.   In the ED vital signs notable for intermittent tachycardia into the low 100s as well has hypertension, other vital signs unremarkable.  BMP was unremarkable with creatinine at her baseline of 1.1, CBC was unremarkable.  BNP was elevated at 349, last check a year ago it was 158.  Troponin was mildly positive at 76 but remained flat 2 hours later when it was again 76, 1 year ago troponin was also roughly the same at 63 and 70.  COVID and flu test were negative.  EKG showed AFib without any acute ischemic changes.  Chest x-ray showed cardiomegaly and signs consistent with pulmonary edema.  She was given IV Lasix and admitted for further management.  HOSPITAL COURSE  by problem list   Acute  diastolic Heart Failure  -likely precipitated by Afib - responded well to IV lasix - follow up 2D echocardiogram LVEF 55-60%   New Atrial Fibrillation  - Pt had already been on rivaxoban for another reason - metoprolol 25 BID ordered - follow up 2D echocardiogram:  LVEF 55-60% - TSH screen  WNL    Essential hypertension  - resumed home meds - added metoprolol 25 mg BID   OAB  - resumed home med   Hypothyroidism  - resume home levothyroxine     DVT prophylaxis: rivaroxaban  Code Status: DNR  Family Communication: bedside update with daughter 9/29, 9/30 Disposition: return home with home health  Status is: Inpatient  Discharge Diagnoses:  Active Problems:   Hip fracture (HCC)   CVA (cerebral vascular accident) ([place])   Volume overload   Essential hypertension   Gastroesophageal reflux disease without esophagitis   History of deep vein thrombosis   Hypothyroidism   Mixed stress and urge urinary incontinence   Overactive bladder   Heart failure [facility])   Discharge Instructions:  Allergies as of 12/15/2020       Reactions   Adhesive [tape] Other (See Comments)   Red itchy   Tapentadol Other (See Comments)   Red itchy        Medication List     TAKE these medications    acetaminophen 325 MG tablet Commonly known as: TYLENOL Take 650 mg by mouth every  6 (six) hours as needed for pain.   amLODipine 5 MG tablet Commonly known as: NORVASC Take 1 tablet (5 mg total) by mouth daily.   aspirin EC 81 MG tablet Take 81 mg by mouth daily. Swallow whole.   calcium carbonate 1500 (600 Ca) MG Tabs tablet Commonly known as: OSCAL Take 600 mg by mouth 2 (two) times daily with a meal.   Cholecalciferol 50 MCG (2000 UT) Caps Take 2,000 Units by mouth daily.   furosemide 20 MG tablet Commonly known as: LASIX Take 20 mg by mouth daily as needed for fluid.   levothyroxine 150 MCG tablet Commonly known as: SYNTHROID Take 150 mcg by mouth daily.   Lubricant Eye  Drops 0.4-0.3 % Soln Generic drug: Polyethyl Glycol-Propyl Glycol Place 1 drop into both eyes 3 (three) times daily as needed. For dry eyes.   metoprolol tartrate 25 MG tablet Commonly known as: LOPRESSOR Take 1 tablet (25 mg total) by mouth 2 (two) times daily.   mirabegron ER 25 MG Tb24 tablet Commonly known as: MYRBETRIQ Take 1 tablet (25 mg total) by mouth daily.   pantoprazole 40 MG tablet Commonly known as: PROTONIX Take 40 mg by mouth 2 (two) times daily.   polyethylene glycol 17 g packet Commonly known as: MIRALAX / GLYCOLAX Take 17 g by mouth daily as needed for mild constipation.   potassium chloride 10 MEQ tablet Commonly known as: KLOR-CON Take 10 mEq by mouth daily as needed.   zinc gluconate 50 MG tablet Take 50 mg by mouth daily.        Follow-up Information     Celene Squibb, MD. Schedule an appointment as soon as possible for a visit in 1 week(s).   Specialty: Internal Medicine Why: Hospital Follow Up Contact information: [facility] [address] [phone]                Allergies  Allergen Reactions   Adhesive [Tape] Other (See Comments)    Red itchy   Tapentadol Other (See Comments)    Red itchy   Allergies as of 12/15/2020       Reactions   Adhesive [tape] Other (See Comments)   Red itchy   Tapentadol Other (See Comments)   Red itchy        Medication List     TAKE these medications    acetaminophen 325 MG tablet Commonly known as: TYLENOL Take 650 mg by mouth every 6 (six) hours as needed for pain.   amLODipine 5 MG tablet Commonly known as: NORVASC Take 1 tablet (5 mg total) by mouth daily.   aspirin EC 81 MG tablet Take 81 mg by mouth daily. Swallow whole.   calcium carbonate 1500 (600 Ca) MG Tabs tablet Commonly known as: OSCAL Take 600 mg by mouth 2 (two) times daily with a meal.   Cholecalciferol 50 MCG (2000 UT) Caps Take 2,000 Units by mouth daily.   furosemide 20 MG tablet Commonly  known as: LASIX Take 20 mg by mouth daily as needed for fluid.   levothyroxine 150 MCG tablet Commonly known as: SYNTHROID Take 150 mcg by mouth daily.   Lubricant Eye Drops 0.4-0.3 % Soln Generic drug: Polyethyl Glycol-Propyl Glycol Place 1 drop into both eyes 3 (three) times daily as needed. For dry eyes.   metoprolol tartrate 25 MG tablet Commonly known as: LOPRESSOR Take 1 tablet (25 mg total) by mouth 2 (two) times daily.   mirabegron ER 25 MG Tb24  tablet Commonly known as: MYRBETRIQ Take 1 tablet (25 mg total) by mouth daily.   pantoprazole 40 MG tablet Commonly known as: PROTONIX Take 40 mg by mouth 2 (two) times daily.   polyethylene glycol 17 g packet Commonly known as: MIRALAX / GLYCOLAX Take 17 g by mouth daily as needed for mild constipation.   potassium chloride 10 MEQ tablet Commonly known as: KLOR-CON Take 10 mEq by mouth daily as needed.   zinc gluconate 50 MG tablet Take 50 mg by mouth daily.        Procedures/Studies: DG Chest 2 View  Result Date: 12/13/2020 CLINICAL DATA:  Shortness of breath leg swelling EXAM: CHEST - 2 VIEW COMPARISON:  Chest radiograph 12/04/2019 FINDINGS: The heart is enlarged. The mediastinal contours are within normal limits. Upper mediastinal surgical clips are again noted. There are increased interstitial markings throughout both lungs. There is no focal consolidation. There is a trace left pleural effusion. There is no significant right effusion. There is no pneumothorax. The bones are diffusely demineralized. There is multilevel degenerative change throughout the thoracic spine. There is compression deformity of the T10 vertebral body which may be increased from prior. IMPRESSION: 1. Cardiomegaly with increased interstitial markings throughout both lungs raising suspicion for mild pulmonary interstitial edema. 2. Trace left pleural effusion. 3. Compression deformity of the T10 vertebral body may be increased since 2021. Correlate  with any symptoms. Dedicated imaging of the thoracic spine may be obtained as indicated. Electronically Signed   By: Valetta Mole M.D.   On: 12/13/2020 16:47   US Abdomen Limited  Result Date: 12/14/2020 CLINICAL DATA:  Assess for ascites.  Abdominal distention EXAM: LIMITED ABDOMEN ULTRASOUND FOR ASCITES TECHNIQUE: Limited ultrasound survey for ascites was performed in all four abdominal quadrants. COMPARISON:  None. FINDINGS: No ascites seen in the 4 quadrants of the abdomen. IMPRESSION: No evidence of ascites. Electronically Signed   By: Rolm Baptise M.D.   On: 12/14/2020 11:50   DG CHEST PORT 1 VIEW  Result Date: 12/14/2020 CLINICAL DATA:  Short of breath.  Pulmonary edema suspected. EXAM: PORTABLE CHEST 1 VIEW COMPARISON:  12/13/2020 FINDINGS: Stable cardiac enlargement. Aortic atherosclerotic calcifications. No pleural effusion or edema. No airspace densities. Diffuse chronic interstitial coarsening noted bilaterally. Degenerative changes are noted within both glenohumeral joints. No acute osseous abnormality. IMPRESSION: No acute cardiopulmonary abnormalities. Electronically Signed   By: Kerby Moors M.D.   On: 12/14/2020 14:26   ECHOCARDIOGRAM COMPLETE  Result Date: 12/14/2020    ECHOCARDIOGRAM REPORT   Patient Name:   Christine [name] Date of Exam: 12/14/2020 Medical Rec #:  [id number]      Height:       62.0 in Accession #:    [id number]     Weight:       150.0 lb Date of Birth:  September 27, 1920      BSA:          1.692 m Patient Age:    89 years       BP:           161/71 mmHg Patient Gender: F              HR:           111 bpm. Exam Location:  [facility] Procedure: 2D Echo, Cardiac Doppler and Color Doppler Indications:    Congestive Heart Failure  History:        Patient has prior history of Echocardiogram examinations, most  recent 05/20/2018. Stroke, Arrythmias:LBBB; Risk                 Factors:Hypertension.  Sonographer:    Wenda Low Referring Phys: [id number] MATTHEW M  ECKSTAT IMPRESSIONS  1. Left ventricular ejection fraction, by estimation, is 55 to 60%. The left ventricle has normal function. The left ventricle has no regional wall motion abnormalities. There is moderate concentric left ventricular hypertrophy. Left ventricular diastolic parameters are indeterminate.  2. RV-RA gradient 41 mmHg suggesting mild to moderate elevation in RVSP. Right ventricular systolic function is normal. The right ventricular size is normal.  3. Left atrial size was mildly dilated.  4. The mitral valve is degenerative. Mild mitral valve regurgitation.  5. The aortic valve is tricuspid. There is moderate calcification of the aortic valve. Aortic valve regurgitation is mild to moderate. Mild to moderate aortic valve sclerosis/calcification is present, without any evidence of aortic stenosis. Aortic regurgitation PHT measures 372 msec. Aortic valve mean gradient measures 4.0 mmHg.  6. Unable to estimate CVP. Comparison(s): Prior images reviewed side by side. LVEF is normal range. FINDINGS  Left Ventricle: Left ventricular ejection fraction, by estimation, is 55 to 60%. The left ventricle has normal function. The left ventricle has no regional wall motion abnormalities. The left ventricular internal cavity size was normal in size. There is  moderate concentric left ventricular hypertrophy. Left ventricular diastolic parameters are indeterminate. Right Ventricle: RV-RA gradient 41 mmHg suggesting mild to moderate elevation in RVSP. The right ventricular size is normal. No increase in right ventricular wall thickness. Right ventricular systolic function is normal. Left Atrium: Left atrial size was mildly dilated. Right Atrium: Right atrial size was normal in size. Pericardium: There is no evidence of pericardial effusion. Presence of pericardial fat pad. Mitral Valve: The mitral valve is degenerative in appearance. There is mild thickening of the mitral valve leaflet(s). Mild to moderate mitral annular  calcification. Mild mitral valve regurgitation. MV peak gradient, 8.9 mmHg. The mean mitral valve gradient is 4.0 mmHg. Tricuspid Valve: The tricuspid valve is grossly normal. Tricuspid valve regurgitation is mild. Aortic Valve: The aortic valve is tricuspid. There is moderate calcification of the aortic valve. Aortic valve regurgitation is mild to moderate. Aortic regurgitation PHT measures 372 msec. Mild to moderate aortic valve sclerosis/calcification is present, without any evidence of aortic stenosis. Aortic valve mean gradient measures 4.0 mmHg. Aortic valve peak gradient measures 6.6 mmHg. Aortic valve area, by VTI measures 1.45 cm. Pulmonic Valve: The pulmonic valve was grossly normal. Pulmonic valve regurgitation is trivial. Aorta: The aortic root is normal in size and structure. Venous: Unable to estimate CVP. The inferior vena cava was not well visualized. IAS/Shunts: No atrial level shunt detected by color flow Doppler.  LEFT VENTRICLE PLAX 2D LVIDd:         3.80 cm  Diastology LVIDs:         2.85 cm  LV e' medial:    6.85 cm/s LV PW:         1.50 cm  LV E/e' medial:  19.6 LV IVS:        1.30 cm  LV e' lateral:   7.83 cm/s LVOT diam:     1.80 cm  LV E/e' lateral: 17.1 LV SV:         32 LV SV Index:   19 LVOT Area:     2.54 cm  RIGHT VENTRICLE RV Basal diam:  3.25 cm RV Mid diam:    2.30 cm RV S prime:  17.10 cm/s TAPSE (M-mode): 1.6 cm LEFT ATRIUM             Index       RIGHT ATRIUM           Index LA diam:        3.30 cm 1.95 cm/m  RA Area:     19.10 cm LA Vol (A2C):   76.9 ml 45.46 ml/m RA Volume:   47.10 ml  27.84 ml/m LA Vol (A4C):   56.7 ml 33.52 ml/m LA Biplane Vol: 67.8 ml 40.08 ml/m  AORTIC VALVE                   PULMONIC VALVE AV Area (Vmax):    1.41 cm    PV Vmax:       0.63 m/s AV Area (Vmean):   1.50 cm    PV Peak grad:  1.6 mmHg AV Area (VTI):     1.45 cm AV Vmax:           128.00 cm/s AV Vmean:          87.600 cm/s AV VTI:            0.219 m AV Peak Grad:      6.6 mmHg AV  Mean Grad:      4.0 mmHg LVOT Vmax:         70.70 cm/s LVOT Vmean:        51.700 cm/s LVOT VTI:          0.125 m LVOT/AV VTI ratio: 0.57 AI PHT:            372 msec  AORTA Ao Root diam: 3.60 cm MITRAL VALVE                TRICUSPID VALVE MV Area (PHT): 7.59 cm     TR Peak grad:   41.0 mmHg MV Area VTI:   1.50 cm     TR Vmax:        320.00 cm/s MV Peak grad:  8.9 mmHg MV Mean grad:  4.0 mmHg     SHUNTS MV Vmax:       1.49 m/s     Systemic VTI:  0.12 m MV Vmean:      90.8 cm/s    Systemic Diam: 1.80 cm MV Decel Time: 100 msec MV E velocity: 134.00 cm/s Rozann Lesches MD Electronically signed by Rozann Lesches MD Signature Date/Time: 12/14/2020/9:48:07 AM    Final      Subjective: Pt without complaints.   Discharge Exam: Vitals:   12/14/20 2230 12/15/20 0531  BP:  134/74  Pulse:  (!) 101  Resp:  20  Temp: (!) 97.5 F (36.4 C) 97.6 F (36.4 C)  SpO2:  95%   Vitals:   12/14/20 1500 12/14/20 2227 12/14/20 2230 12/15/20 0531  BP:  131/77  134/74  Pulse: (!) 104 (!) 102  (!) 101  Resp:  16  20  Temp:   (!) 97.5 F (36.4 C) 97.6 F (36.4 C)  TempSrc:   Oral Oral  SpO2:  97%  95%  Weight:    70.3 kg  Height:    5\' 2"  (1.575 m)   General: Pt is alert, awake, not in acute distress Cardiovascular: normal S1/S2 +, no rubs, no gallops Respiratory: CTA bilaterally, no wheezing, no rhonchi Abdominal: Soft, NT, ND, bowel sounds + Extremities: no edema, no cyanosis   The results of significant diagnostics from this hospitalization (including imaging, microbiology,  ancillary and laboratory) are listed below for reference.     Microbiology: Recent Results (from the past 240 hour(s))  Resp Panel by RT-PCR (Flu A&B, Covid) Nasopharyngeal Swab     Status: None   Collection Time: 12/13/20  4:40 PM   Specimen: Nasopharyngeal Swab; Nasopharyngeal(NP) swabs in vial transport medium  Result Value Ref Range Status   SARS Coronavirus 2 by RT PCR NEGATIVE NEGATIVE Final    Comment: (NOTE) SARS-CoV-2  target nucleic acids are NOT DETECTED.  The SARS-CoV-2 RNA is generally detectable in upper respiratory specimens during the acute phase of infection. The lowest concentration of SARS-CoV-2 viral copies this assay can detect is 138 copies/mL. A negative result does not preclude SARS-Cov-2 infection and should not be used as the sole basis for treatment or other patient management decisions. A negative result may occur with  improper specimen collection/handling, submission of specimen other than nasopharyngeal swab, presence of viral mutation(s) within the areas targeted by this assay, and inadequate number of viral copies(<138 copies/mL). A negative result must be combined with clinical observations, patient history, and epidemiological information. The expected result is Negative.  Fact Sheet for Patients:  EntrepreneurPulse.com.au  Fact Sheet for Healthcare Providers:  IncredibleEmployment.be  This test is no t yet approved or cleared by the [place] FDA and  has been authorized for detection and/or diagnosis of SARS-CoV-2 by FDA under an Emergency Use Authorization (EUA). This EUA will remain  in effect (meaning this test can be used) for the duration of the COVID-19 declaration under Section 564(b)(1) of the Act, 21 U.S.C.section 360bbb-3(b)(1), unless the authorization is terminated  or revoked sooner.       Influenza A by PCR NEGATIVE NEGATIVE Final   Influenza B by PCR NEGATIVE NEGATIVE Final    Comment: (NOTE) The Xpert Xpress SARS-CoV-2/FLU/RSV plus assay is intended as an aid in the diagnosis of influenza from Nasopharyngeal swab specimens and should not be used as a sole basis for treatment. Nasal washings and aspirates are unacceptable for Xpert Xpress SARS-CoV-2/FLU/RSV testing.  Fact Sheet for Patients: EntrepreneurPulse.com.au  Fact Sheet for Healthcare  Providers: IncredibleEmployment.be  This test is not yet approved or cleared by the [place] FDA and has been authorized for detection and/or diagnosis of SARS-CoV-2 by FDA under an Emergency Use Authorization (EUA). This EUA will remain in effect (meaning this test can be used) for the duration of the COVID-19 declaration under Section 564(b)(1) of the Act, 21 U.S.C. section 360bbb-3(b)(1), unless the authorization is terminated or revoked.  Performed at [facility], [address] [place], [place] [address]      Labs: BNP (last 3 results) Recent Labs    12/13/20 1646  BNP 824.2*   Basic Metabolic Panel: Recent Labs  Lab 12/13/20 1646 12/14/20 0534 12/15/20 0055  NA 138 140 134*  K 3.6 3.3* 4.1  CL 102 98 96*  CO2 27 31 29   GLUCOSE 134* 103* 118*  BUN 13 12 17   CREATININE 1.11* 0.95 1.41*  CALCIUM 9.1 9.1 8.5*  MG  --   --  1.9   Liver Function Tests: No results for input(s): AST, ALT, ALKPHOS, BILITOT, PROT, ALBUMIN in the last 168 hours. No results for input(s): LIPASE, AMYLASE in the last 168 hours. No results for input(s): AMMONIA in the last 168 hours. CBC: Recent Labs  Lab 12/13/20 1646 12/14/20 0534  WBC 9.0 9.0  NEUTROABS 6.1  --   HGB 12.3 11.6*  HCT 37.7 35.6*  MCV 100.3* 98.9  PLT 279 269   Cardiac Enzymes: No results for input(s): CKTOTAL, CKMB, CKMBINDEX, TROPONINI in the last 168 hours. BNP: Invalid input(s): POCBNP CBG: No results for input(s): GLUCAP in the last 168 hours. D-Dimer No results for input(s): DDIMER in the last 72 hours. Hgb A1c No results for input(s): HGBA1C in the last 72 hours. Lipid Profile No results for input(s): CHOL, HDL, LDLCALC, TRIG, CHOLHDL, LDLDIRECT in the last 72 hours. Thyroid function studies Recent Labs    12/13/20 2146  TSH 0.870   Anemia work up Recent Labs    12/13/20 2146  VITAMINB12 859  FERRITIN 34  TIBC 329  IRON 20*   Urinalysis    Component Value  Date/Time   COLORURINE COLORLESS (A) 12/13/2020 2248   APPEARANCEUR CLEAR 12/13/2020 2248   LABSPEC 1.005 12/13/2020 2248   PHURINE 7.0 12/13/2020 2248   GLUCOSEU NEGATIVE 12/13/2020 2248   HGBUR NEGATIVE 12/13/2020 2248   BILIRUBINUR NEGATIVE 12/13/2020 2248   KETONESUR NEGATIVE 12/13/2020 2248   PROTEINUR NEGATIVE 12/13/2020 2248   UROBILINOGEN 0.2 04/23/2010 1040   NITRITE NEGATIVE 12/13/2020 2248   LEUKOCYTESUR NEGATIVE 12/13/2020 2248   Sepsis Labs Invalid input(s): PROCALCITONIN,  WBC,  LACTICIDVEN Microbiology Recent Results (from the past 240 hour(s))  Resp Panel by RT-PCR (Flu A&B, Covid) Nasopharyngeal Swab     Status: None   Collection Time: 12/13/20  4:40 PM   Specimen: Nasopharyngeal Swab; Nasopharyngeal(NP) swabs in vial transport medium  Result Value Ref Range Status   SARS Coronavirus 2 by RT PCR NEGATIVE NEGATIVE Final    Comment: (NOTE) SARS-CoV-2 target nucleic acids are NOT DETECTED.  The SARS-CoV-2 RNA is generally detectable in upper respiratory specimens during the acute phase of infection. The lowest concentration of SARS-CoV-2 viral copies this assay can detect is 138 copies/mL. A negative result does not preclude SARS-Cov-2 infection and should not be used as the sole basis for treatment or other patient management decisions. A negative result may occur with  improper specimen collection/handling, submission of specimen other than nasopharyngeal swab, presence of viral mutation(s) within the areas targeted by this assay, and inadequate number of viral copies(<138 copies/mL). A negative result must be combined with clinical observations, patient history, and epidemiological information. The expected result is Negative.  Fact Sheet for Patients:  EntrepreneurPulse.com.au  Fact Sheet for Healthcare Providers:  IncredibleEmployment.be  This test is no t yet approved or cleared by the [place] FDA and  has been  authorized for detection and/or diagnosis of SARS-CoV-2 by FDA under an Emergency Use Authorization (EUA). This EUA will remain  in effect (meaning this test can be used) for the duration of the COVID-19 declaration under Section 564(b)(1) of the Act, 21 U.S.C.section 360bbb-3(b)(1), unless the authorization is terminated  or revoked sooner.       Influenza A by PCR NEGATIVE NEGATIVE Final   Influenza B by PCR NEGATIVE NEGATIVE Final    Comment: (NOTE) The Xpert Xpress SARS-CoV-2/FLU/RSV plus assay is intended as an aid in the diagnosis of influenza from Nasopharyngeal swab specimens and should not be used as a sole basis for treatment. Nasal washings and aspirates are unacceptable for Xpert Xpress SARS-CoV-2/FLU/RSV testing.  Fact Sheet for Patients: EntrepreneurPulse.com.au  Fact Sheet for Healthcare Providers: IncredibleEmployment.be  This test is not yet approved or cleared by the [place] FDA and has been authorized for detection and/or diagnosis of SARS-CoV-2 by FDA under an Emergency Use Authorization (EUA). This EUA will remain in effect (meaning this test can be  used) for the duration of the COVID-19 declaration under Section 564(b)(1) of the Act, 21 U.S.C. section 360bbb-3(b)(1), unless the authorization is terminated or revoked.  Performed at [facility], [address], [place], [place] [address]     Time coordinating discharge: 35 mins   SIGNED:  Irwin Brakeman, MD  [facility] 12/15/2020, 10:01 AM How to contact the [facility] Attending or Consulting provider [place] or covering provider during after hours [place], for this patient?  Check the care team in [facility] and look for a) attending/consulting [facility] provider listed and b) the [facility] team listed Log into [email or web address] and use [place]'s universal password to access. If you do not have the password, please contact the hospital operator. Locate the [facility] provider you are  looking for under [facility] and page to a number that you can be directly reached. If you still have difficulty reaching the provider, please page the [facility] (Director on Call) for the Hospitalists listed on amion for assistance.

## 2020-12-15 NOTE — Plan of Care (Signed)

## 2020-12-19 DIAGNOSIS — R531 Weakness: Secondary | ICD-10-CM | POA: Diagnosis not present

## 2020-12-19 DIAGNOSIS — I639 Cerebral infarction, unspecified: Secondary | ICD-10-CM | POA: Diagnosis not present

## 2020-12-19 DIAGNOSIS — K219 Gastro-esophageal reflux disease without esophagitis: Secondary | ICD-10-CM | POA: Diagnosis not present

## 2020-12-19 DIAGNOSIS — I10 Essential (primary) hypertension: Secondary | ICD-10-CM | POA: Diagnosis not present

## 2020-12-19 DIAGNOSIS — E039 Hypothyroidism, unspecified: Secondary | ICD-10-CM | POA: Diagnosis not present

## 2020-12-19 DIAGNOSIS — I482 Chronic atrial fibrillation, unspecified: Secondary | ICD-10-CM | POA: Diagnosis not present

## 2020-12-19 DIAGNOSIS — I429 Cardiomyopathy, unspecified: Secondary | ICD-10-CM | POA: Diagnosis not present

## 2020-12-19 DIAGNOSIS — I509 Heart failure, unspecified: Secondary | ICD-10-CM | POA: Diagnosis not present

## 2020-12-19 DIAGNOSIS — N3281 Overactive bladder: Secondary | ICD-10-CM | POA: Diagnosis not present

## 2020-12-20 DIAGNOSIS — I509 Heart failure, unspecified: Secondary | ICD-10-CM | POA: Diagnosis not present

## 2020-12-20 DIAGNOSIS — F5104 Psychophysiologic insomnia: Secondary | ICD-10-CM | POA: Diagnosis not present

## 2020-12-20 DIAGNOSIS — I4891 Unspecified atrial fibrillation: Secondary | ICD-10-CM | POA: Diagnosis not present

## 2020-12-20 DIAGNOSIS — Z758 Other problems related to medical facilities and other health care: Secondary | ICD-10-CM | POA: Diagnosis not present

## 2021-06-22 DIAGNOSIS — N3281 Overactive bladder: Secondary | ICD-10-CM | POA: Diagnosis not present

## 2021-06-22 DIAGNOSIS — I10 Essential (primary) hypertension: Secondary | ICD-10-CM | POA: Diagnosis not present

## 2021-06-22 DIAGNOSIS — I482 Chronic atrial fibrillation, unspecified: Secondary | ICD-10-CM | POA: Diagnosis not present

## 2021-06-22 DIAGNOSIS — I509 Heart failure, unspecified: Secondary | ICD-10-CM | POA: Diagnosis not present

## 2021-06-22 DIAGNOSIS — R531 Weakness: Secondary | ICD-10-CM | POA: Diagnosis not present

## 2021-06-22 DIAGNOSIS — I429 Cardiomyopathy, unspecified: Secondary | ICD-10-CM | POA: Diagnosis not present

## 2021-06-22 DIAGNOSIS — E039 Hypothyroidism, unspecified: Secondary | ICD-10-CM | POA: Diagnosis not present

## 2021-06-22 DIAGNOSIS — I639 Cerebral infarction, unspecified: Secondary | ICD-10-CM | POA: Diagnosis not present

## 2021-06-22 DIAGNOSIS — K219 Gastro-esophageal reflux disease without esophagitis: Secondary | ICD-10-CM | POA: Diagnosis not present

## 2021-07-10 DIAGNOSIS — E039 Hypothyroidism, unspecified: Secondary | ICD-10-CM | POA: Diagnosis not present

## 2021-07-10 DIAGNOSIS — I509 Heart failure, unspecified: Secondary | ICD-10-CM | POA: Diagnosis not present

## 2021-08-23 DIAGNOSIS — I10 Essential (primary) hypertension: Secondary | ICD-10-CM | POA: Diagnosis not present

## 2021-08-23 DIAGNOSIS — K219 Gastro-esophageal reflux disease without esophagitis: Secondary | ICD-10-CM | POA: Diagnosis not present

## 2021-08-23 DIAGNOSIS — I639 Cerebral infarction, unspecified: Secondary | ICD-10-CM | POA: Diagnosis not present

## 2021-08-23 DIAGNOSIS — E039 Hypothyroidism, unspecified: Secondary | ICD-10-CM | POA: Diagnosis not present

## 2021-08-23 DIAGNOSIS — I482 Chronic atrial fibrillation, unspecified: Secondary | ICD-10-CM | POA: Diagnosis not present

## 2021-08-23 DIAGNOSIS — R531 Weakness: Secondary | ICD-10-CM | POA: Diagnosis not present

## 2021-08-23 DIAGNOSIS — I429 Cardiomyopathy, unspecified: Secondary | ICD-10-CM | POA: Diagnosis not present

## 2021-08-23 DIAGNOSIS — N3281 Overactive bladder: Secondary | ICD-10-CM | POA: Diagnosis not present

## 2021-08-23 DIAGNOSIS — I509 Heart failure, unspecified: Secondary | ICD-10-CM | POA: Diagnosis not present

## 2021-09-12 DIAGNOSIS — M1712 Unilateral primary osteoarthritis, left knee: Secondary | ICD-10-CM | POA: Diagnosis not present

## 2021-10-24 DIAGNOSIS — R531 Weakness: Secondary | ICD-10-CM | POA: Diagnosis not present

## 2021-10-24 DIAGNOSIS — I429 Cardiomyopathy, unspecified: Secondary | ICD-10-CM | POA: Diagnosis not present

## 2021-10-24 DIAGNOSIS — I639 Cerebral infarction, unspecified: Secondary | ICD-10-CM | POA: Diagnosis not present

## 2021-10-24 DIAGNOSIS — I509 Heart failure, unspecified: Secondary | ICD-10-CM | POA: Diagnosis not present

## 2021-10-24 DIAGNOSIS — N3281 Overactive bladder: Secondary | ICD-10-CM | POA: Diagnosis not present

## 2021-10-24 DIAGNOSIS — I482 Chronic atrial fibrillation, unspecified: Secondary | ICD-10-CM | POA: Diagnosis not present

## 2021-10-24 DIAGNOSIS — E039 Hypothyroidism, unspecified: Secondary | ICD-10-CM | POA: Diagnosis not present

## 2021-10-24 DIAGNOSIS — K219 Gastro-esophageal reflux disease without esophagitis: Secondary | ICD-10-CM | POA: Diagnosis not present

## 2021-10-24 DIAGNOSIS — I10 Essential (primary) hypertension: Secondary | ICD-10-CM | POA: Diagnosis not present

## 2021-11-12 DIAGNOSIS — R2243 Localized swelling, mass and lump, lower limb, bilateral: Secondary | ICD-10-CM | POA: Diagnosis not present

## 2021-11-12 DIAGNOSIS — E039 Hypothyroidism, unspecified: Secondary | ICD-10-CM | POA: Diagnosis not present

## 2021-11-12 DIAGNOSIS — I509 Heart failure, unspecified: Secondary | ICD-10-CM | POA: Diagnosis not present

## 2021-11-12 DIAGNOSIS — Z86718 Personal history of other venous thrombosis and embolism: Secondary | ICD-10-CM | POA: Diagnosis not present

## 2021-11-12 DIAGNOSIS — I4891 Unspecified atrial fibrillation: Secondary | ICD-10-CM | POA: Diagnosis not present

## 2021-11-12 DIAGNOSIS — M810 Age-related osteoporosis without current pathological fracture: Secondary | ICD-10-CM | POA: Diagnosis not present

## 2021-11-12 DIAGNOSIS — I10 Essential (primary) hypertension: Secondary | ICD-10-CM | POA: Diagnosis not present

## 2021-11-12 DIAGNOSIS — K219 Gastro-esophageal reflux disease without esophagitis: Secondary | ICD-10-CM | POA: Diagnosis not present

## 2021-11-12 DIAGNOSIS — Z85038 Personal history of other malignant neoplasm of large intestine: Secondary | ICD-10-CM | POA: Diagnosis not present

## 2021-11-12 DIAGNOSIS — N3281 Overactive bladder: Secondary | ICD-10-CM | POA: Diagnosis not present

## 2022-02-25 DIAGNOSIS — I509 Heart failure, unspecified: Secondary | ICD-10-CM | POA: Diagnosis not present

## 2022-02-25 DIAGNOSIS — Z86718 Personal history of other venous thrombosis and embolism: Secondary | ICD-10-CM | POA: Diagnosis not present

## 2022-02-25 DIAGNOSIS — N3281 Overactive bladder: Secondary | ICD-10-CM | POA: Diagnosis not present

## 2022-02-25 DIAGNOSIS — K219 Gastro-esophageal reflux disease without esophagitis: Secondary | ICD-10-CM | POA: Diagnosis not present

## 2022-02-25 DIAGNOSIS — R2243 Localized swelling, mass and lump, lower limb, bilateral: Secondary | ICD-10-CM | POA: Diagnosis not present

## 2022-02-25 DIAGNOSIS — I4891 Unspecified atrial fibrillation: Secondary | ICD-10-CM | POA: Diagnosis not present

## 2022-02-25 DIAGNOSIS — E039 Hypothyroidism, unspecified: Secondary | ICD-10-CM | POA: Diagnosis not present

## 2022-02-25 DIAGNOSIS — Z85038 Personal history of other malignant neoplasm of large intestine: Secondary | ICD-10-CM | POA: Diagnosis not present

## 2022-02-25 DIAGNOSIS — I10 Essential (primary) hypertension: Secondary | ICD-10-CM | POA: Diagnosis not present

## 2022-02-25 DIAGNOSIS — M810 Age-related osteoporosis without current pathological fracture: Secondary | ICD-10-CM | POA: Diagnosis not present

## 2022-06-27 DIAGNOSIS — M810 Age-related osteoporosis without current pathological fracture: Secondary | ICD-10-CM | POA: Diagnosis not present

## 2022-06-27 DIAGNOSIS — N1832 Chronic kidney disease, stage 3b: Secondary | ICD-10-CM | POA: Diagnosis not present

## 2022-06-27 DIAGNOSIS — R2243 Localized swelling, mass and lump, lower limb, bilateral: Secondary | ICD-10-CM | POA: Diagnosis not present

## 2022-06-27 DIAGNOSIS — Z0000 Encounter for general adult medical examination without abnormal findings: Secondary | ICD-10-CM | POA: Diagnosis not present

## 2022-06-27 DIAGNOSIS — I10 Essential (primary) hypertension: Secondary | ICD-10-CM | POA: Diagnosis not present

## 2022-06-27 DIAGNOSIS — K219 Gastro-esophageal reflux disease without esophagitis: Secondary | ICD-10-CM | POA: Diagnosis not present

## 2022-06-27 DIAGNOSIS — I130 Hypertensive heart and chronic kidney disease with heart failure and stage 1 through stage 4 chronic kidney disease, or unspecified chronic kidney disease: Secondary | ICD-10-CM | POA: Diagnosis not present

## 2022-06-27 DIAGNOSIS — N3281 Overactive bladder: Secondary | ICD-10-CM | POA: Diagnosis not present

## 2022-06-27 DIAGNOSIS — E039 Hypothyroidism, unspecified: Secondary | ICD-10-CM | POA: Diagnosis not present

## 2022-06-27 DIAGNOSIS — M7989 Other specified soft tissue disorders: Secondary | ICD-10-CM | POA: Diagnosis not present

## 2022-06-27 DIAGNOSIS — I509 Heart failure, unspecified: Secondary | ICD-10-CM | POA: Diagnosis not present

## 2022-07-10 DIAGNOSIS — H9220 Otorrhagia, unspecified ear: Secondary | ICD-10-CM | POA: Diagnosis not present

## 2022-07-19 DIAGNOSIS — H60533 Acute contact otitis externa, bilateral: Secondary | ICD-10-CM | POA: Diagnosis not present

## 2022-09-16 DEATH — deceased
